# Patient Record
Sex: Male | Born: 1935
Health system: Southern US, Community
[De-identification: ages and names within clinical notes are randomized; demographics above are authoritative.]

## PROBLEM LIST (undated history)

## (undated) DIAGNOSIS — K219 Gastro-esophageal reflux disease without esophagitis: Secondary | ICD-10-CM

## (undated) DIAGNOSIS — Z9109 Other allergy status, other than to drugs and biological substances: Secondary | ICD-10-CM

## (undated) DIAGNOSIS — I10 Essential (primary) hypertension: Secondary | ICD-10-CM

## (undated) DIAGNOSIS — G4730 Sleep apnea, unspecified: Secondary | ICD-10-CM

## (undated) DIAGNOSIS — N200 Calculus of kidney: Secondary | ICD-10-CM

## (undated) DIAGNOSIS — K469 Unspecified abdominal hernia without obstruction or gangrene: Secondary | ICD-10-CM

## (undated) DIAGNOSIS — Z8719 Personal history of other diseases of the digestive system: Secondary | ICD-10-CM

## (undated) DIAGNOSIS — D751 Secondary polycythemia: Principal | ICD-10-CM

## (undated) DIAGNOSIS — M1990 Unspecified osteoarthritis, unspecified site: Secondary | ICD-10-CM

## (undated) DIAGNOSIS — Z9289 Personal history of other medical treatment: Secondary | ICD-10-CM

## (undated) DIAGNOSIS — E039 Hypothyroidism, unspecified: Secondary | ICD-10-CM

## (undated) HISTORY — DX: Sleep apnea, unspecified: G47.30

## (undated) HISTORY — DX: Calculus of kidney: N20.0

## (undated) HISTORY — DX: Unspecified abdominal hernia without obstruction or gangrene: K46.9

## (undated) HISTORY — PX: SINOSCOPY: SHX187

## (undated) HISTORY — DX: Other allergy status, other than to drugs and biological substances: Z91.09

## (undated) HISTORY — DX: Essential (primary) hypertension: I10

## (undated) HISTORY — PX: APPENDECTOMY: SHX54

## (undated) HISTORY — PX: EYE SURGERY: SHX253

## (undated) HISTORY — PX: BACK SURGERY: SHX140

## (undated) HISTORY — DX: Secondary polycythemia: D75.1

## (undated) HISTORY — PX: UPPER GASTROINTESTINAL ENDOSCOPY: SHX188

## (undated) HISTORY — PX: COLONOSCOPY: SHX174

---

## 2000-01-30 ENCOUNTER — Encounter (INDEPENDENT_AMBULATORY_CARE_PROVIDER_SITE_OTHER): Payer: Self-pay

## 2000-01-30 ENCOUNTER — Other Ambulatory Visit: Admission: RE | Admit: 2000-01-30 | Discharge: 2000-01-30 | Payer: Self-pay | Admitting: Internal Medicine

## 2002-03-30 ENCOUNTER — Ambulatory Visit: Admission: RE | Admit: 2002-03-30 | Discharge: 2002-03-30 | Payer: Self-pay | Admitting: Family Medicine

## 2003-02-26 ENCOUNTER — Ambulatory Visit (HOSPITAL_COMMUNITY): Admission: RE | Admit: 2003-02-26 | Discharge: 2003-02-26 | Payer: Self-pay | Admitting: Family Medicine

## 2003-04-06 ENCOUNTER — Encounter (HOSPITAL_COMMUNITY): Admission: RE | Admit: 2003-04-06 | Discharge: 2003-04-16 | Payer: Self-pay | Admitting: Orthopedic Surgery

## 2003-04-20 ENCOUNTER — Encounter (HOSPITAL_COMMUNITY): Admission: RE | Admit: 2003-04-20 | Discharge: 2003-05-20 | Payer: Self-pay | Admitting: Orthopaedic Surgery

## 2003-07-01 ENCOUNTER — Inpatient Hospital Stay (HOSPITAL_COMMUNITY): Admission: EM | Admit: 2003-07-01 | Discharge: 2003-07-02 | Payer: Self-pay | Admitting: Emergency Medicine

## 2003-07-02 ENCOUNTER — Ambulatory Visit (HOSPITAL_COMMUNITY): Admission: AD | Admit: 2003-07-02 | Discharge: 2003-07-02 | Payer: Self-pay | Admitting: Cardiology

## 2003-07-08 ENCOUNTER — Ambulatory Visit (HOSPITAL_COMMUNITY): Admission: RE | Admit: 2003-07-08 | Discharge: 2003-07-08 | Payer: Self-pay | Admitting: *Deleted

## 2003-12-27 ENCOUNTER — Ambulatory Visit (HOSPITAL_COMMUNITY): Admission: RE | Admit: 2003-12-27 | Discharge: 2003-12-27 | Payer: Self-pay | Admitting: Orthopedic Surgery

## 2004-02-22 ENCOUNTER — Encounter (HOSPITAL_COMMUNITY)
Admission: RE | Admit: 2004-02-22 | Discharge: 2004-03-23 | Payer: Self-pay | Admitting: Physical Medicine and Rehabilitation

## 2004-03-27 ENCOUNTER — Encounter (HOSPITAL_COMMUNITY)
Admission: RE | Admit: 2004-03-27 | Discharge: 2004-04-14 | Payer: Self-pay | Admitting: Physical Medicine and Rehabilitation

## 2005-05-11 ENCOUNTER — Ambulatory Visit: Payer: Self-pay | Admitting: Internal Medicine

## 2005-05-22 ENCOUNTER — Ambulatory Visit: Payer: Self-pay | Admitting: Cardiology

## 2005-06-15 ENCOUNTER — Encounter (INDEPENDENT_AMBULATORY_CARE_PROVIDER_SITE_OTHER): Payer: Self-pay | Admitting: Internal Medicine

## 2005-06-15 ENCOUNTER — Ambulatory Visit: Payer: Self-pay | Admitting: Internal Medicine

## 2005-06-15 ENCOUNTER — Ambulatory Visit (HOSPITAL_COMMUNITY): Admission: RE | Admit: 2005-06-15 | Discharge: 2005-06-15 | Payer: Self-pay | Admitting: Internal Medicine

## 2005-11-27 ENCOUNTER — Ambulatory Visit (HOSPITAL_COMMUNITY): Admission: RE | Admit: 2005-11-27 | Discharge: 2005-11-27 | Payer: Self-pay | Admitting: Ophthalmology

## 2005-12-11 ENCOUNTER — Ambulatory Visit (HOSPITAL_COMMUNITY): Admission: RE | Admit: 2005-12-11 | Discharge: 2005-12-11 | Payer: Self-pay | Admitting: Ophthalmology

## 2006-03-26 ENCOUNTER — Ambulatory Visit (HOSPITAL_COMMUNITY): Admission: RE | Admit: 2006-03-26 | Discharge: 2006-03-26 | Payer: Self-pay | Admitting: Ophthalmology

## 2006-05-22 ENCOUNTER — Inpatient Hospital Stay (HOSPITAL_COMMUNITY): Admission: AD | Admit: 2006-05-22 | Discharge: 2006-05-23 | Payer: Self-pay | Admitting: Family Medicine

## 2009-04-16 DIAGNOSIS — Z9289 Personal history of other medical treatment: Secondary | ICD-10-CM

## 2009-04-16 HISTORY — DX: Personal history of other medical treatment: Z92.89

## 2009-05-06 ENCOUNTER — Ambulatory Visit (HOSPITAL_COMMUNITY): Admission: RE | Admit: 2009-05-06 | Discharge: 2009-05-06 | Payer: Self-pay | Admitting: Family Medicine

## 2009-12-09 ENCOUNTER — Ambulatory Visit: Payer: Self-pay | Admitting: Cardiology

## 2009-12-09 DIAGNOSIS — I2510 Atherosclerotic heart disease of native coronary artery without angina pectoris: Secondary | ICD-10-CM

## 2009-12-09 DIAGNOSIS — R9431 Abnormal electrocardiogram [ECG] [EKG]: Secondary | ICD-10-CM | POA: Insufficient documentation

## 2009-12-09 DIAGNOSIS — I10 Essential (primary) hypertension: Secondary | ICD-10-CM | POA: Insufficient documentation

## 2009-12-12 ENCOUNTER — Encounter: Payer: Self-pay | Admitting: Cardiology

## 2009-12-23 ENCOUNTER — Ambulatory Visit: Payer: Self-pay | Admitting: Cardiology

## 2009-12-23 ENCOUNTER — Encounter (HOSPITAL_COMMUNITY): Admission: RE | Admit: 2009-12-23 | Discharge: 2009-12-23 | Payer: Self-pay | Admitting: Cardiology

## 2010-05-16 NOTE — Letter (Signed)
Summary: [facility]  [facility]   Imported By: Faythe Ghee 12/12/2009 11:43:44  _____________________________________________________________________  External Attachment:    Type:   Image     Comment:   External Document

## 2010-05-16 NOTE — Letter (Signed)
Summary: LABS 11/14/09 DR Gerda Diss  LABS 11/14/09 DR Gerda Diss   Imported By: Faythe Ghee 12/12/2009 11:44:10  _____________________________________________________________________  External Attachment:    Type:   Image     Comment:   External Document

## 2010-05-16 NOTE — Letter (Signed)
Summary: ekg   ekg   Imported By: Faythe Ghee 12/12/2009 11:42:47  _____________________________________________________________________  External Attachment:    Type:   Image     Comment:   External Document

## 2010-05-16 NOTE — Assessment & Plan Note (Signed)
Summary: ***per Dr.Scott Luking for abnormal EKG and HX of CABG/tg   Visit Type:  Initial Consult Primary Provider:  Dr. Lilyan Punt   History of Present Illness: 76 year old male presents for cardiology consultation. This is my first meeting with him. He was last seen in consultation by Dr. Dietrich Pates in 2008 in the setting of chest pain. At that time he underwent a Myoview study which was normal. Previous cardiac catheterization from 2005 is reviewed below as well.  Recent labs from one August showed sodium 141, potassium 4.3, BUN 12, creatinine 1.1, cholesterol 152, triglyceride 244, HDL 36, LDL 67, AST 24, ALT 32.  He was seen for a routine visit by Dr. Gerda Diss and had a followup ECG obtained, showing changes compared to previous tracings. There were some new lateral ST segment changes although without active symptoms at that time. He states that since he was told his ECG showed some changes, he has noticed some chest discomfort, although fairly atypical, centered on the left side of his chest and in a very focal area about the size of a quarter. No clearly exertional chest pain. He states it may be his reflux.  He has not undergone any surveillance ischemic testing since 2008. He had only mild atherosclerosis with approximately 30-40% LAD stenosis in 2005.  Preventive Screening-Counseling & Management  Alcohol-Tobacco     Smoking Status: current  Current Medications (verified): 1)  Singulair 10 Mg Tabs (Montelukast Sodium) .... Take 1 Tab Daily 2)  Advair Diskus 100-50 Mcg/dose Aepb (Fluticasone-Salmeterol) .... Use 1 Puff Daily 3)  Prilosec 20 Mg Cpdr (Omeprazole) .... Take 1 Tab Daily 4)  Pravastatin Sodium 40 Mg Tabs (Pravastatin Sodium) .... Take 1 Tab Two Times A Day 5)  Diovan Hct 80-12.5 Mg Tabs (Valsartan-Hydrochlorothiazide) .... Take 1 Tab Daily 6)  Allergy Shots .Marland Kitchen.. 1 Time Weekly  Allergies (verified): 1)  ! Penicillin  Past History:  Family History: Last updated:  12-28-2009 Father: died age 36 with stroke Mother: died age 67 with myocardial infarction Siblings: Brother died age 75 with myocardial infarction  Social History: Last updated: 2009-12-28 Tobacco Use - Yes (no smoking, chews) Alcohol Use - yes, wine Part Time - [employer]  Past Medical History: CAD - nonobstructive, LVEF normal Asthma Hyperlipidemia Nephrolithiasis Hypertension Obstructive sleep apnea History of esophageal dilatation  Past Surgical History: Appendectomy  Family History: Father: died age 16 with stroke Mother: died age 20 with myocardial infarction Siblings: Brother died age 65 with myocardial infarction  Social History: Tobacco Use - Yes (no smoking, chews) Alcohol Use - yes, wine Part Time - [employer] Smoking Status:  current  Review of Systems       The patient complains of chest pain.  The patient denies anorexia, fever, syncope, dyspnea on exertion, peripheral edema, prolonged cough, melena, and hematochezia.         Otherwise reviewed and negative except as outlined.  Vital Signs:  Patient profile:   76 year old male Height:      69 inches Weight:      200 pounds BMI:     29.64 Pulse rate:   69 / minute BP sitting:   114 / 75  (right arm)  Vitals Entered By: Dreama Saa, CNA (12-28-09 2:40 PM)  Physical Exam  Additional Exam:  Overweight male in no acute distress. HEENT: Conjunctiva and lids normal, oropharynx with moist mucosa. Neck: Supple, no elevated JVP or bruits. Lungs: Clear to auscultation, nonlabored. Cardiac: Regular rate and rhythm, no S3.  Abdomen: Soft, nontender, bowel sounds present. Skin: Warm and dry. Musculoskeletal: No kyphosis. Extremities: No pitting edema, distal pulses 2+. Neuropsychiatric: Alert and oriented x3, affect appropriate.   Nuclear Study  Procedure date:  05/23/2006  Findings:       IMPRESSION:   Stress Myoview clinically and electrically negative for ischemia.   Myoview scan with  probable normal perfusion and soft tissue   attenuation as noted above.  LVF greater than 70%.  No evidence for   significant ischemia.  Cardiac Cath  Procedure date:  07/02/2003  Findings:       HEMODYNAMIC DATA:  1. Central aortic pressure 116/72, mean 95.  2. Left ventricle 126/8.  3. No gradient on pullback across the aortic valve.   ANGIOGRAPHIC DATA:  1. Ventriculography:  Ventriculography was performed in the RAO projection.     Overall systolic function was well-preserved.  There was mild catheter-     induced mitral regurgitation, but this was not present with movement of     the catheter.  Ejection fraction appeared to exceed 60%.   1. Aortic Root Aortography:  The aortic root aortography revealed no     evidence of aortic regurgitation.  There was no evidence of aortic     dissection.   1. Left Main Coronary Artery:  The left main coronary artery was free of     critical disease.   1. LAD:  The LAD courses to the apex.  There is a major diagonal and a     septal perforator.  After the origin of the septal perforator is a mild     segmental area that appears relatively smooth, that has about 30-40%     luminal reduction.  The distal vessel is without critical narrowing.   1. Ramus Intermedius:  There is a ramus intermedius that is free of critical     disease.   1. Circumflex Artery:  The circumflex is a dominant vessel providing the     posterior descending system.  The vessel appears smooth and without     critical narrowing.   1. Right Coronary Artery:  The right coronary artery demonstrates no     significant focal stenosis.  It is a nondominant vessel.   EKG  Procedure date:  12/09/2009  Findings:      Normal sinus rhythm at 64 beats per minute.  Impression & Recommendations:  Problem # 1:  ABNORMAL ELECTROCARDIOGRAM (ICD-794.31)  ECG today is normal. I reviewed the prior tracings. Probably nonspecific changes overall.  Orders: Nuclear Stress  Test (Nuc Stress Test)  Problem # 2:  CORONARY ATHEROSCLEROSIS NATIVE CORONARY ARTERY (ICD-414.01)  Previous cardiac catheterization in 2005 showed 30-40% LAD stenosis. No subsequent ischemic testing since 2008. In light of his recent followup ECG with nonspecific changes, and symptoms although atypical, with ongoing risk factor profile, a followup exercise Myoview will be obtained for ischemic surveillance. If low risk, no further testing at this time. He can continue medical therapy and followup with Dr. Gerda Diss. Otherwise if significant changes are noted, we can see him back for further discussion.  Problem # 3:  HYPERLIPIDEMIA (ICD-272.4)  LDL at goal based on recent labs.  His updated medication list for this problem includes:    Pravastatin Sodium 40 Mg Tabs (Pravastatin sodium) .Marland Kitchen... Take 1 tab two times a day  Problem # 4:  HYPERTENSION (ICD-401.9)  Blood pressure well controlled.  His updated medication list for this problem includes:    Diovan Hct 80-12.5  Mg Tabs (Valsartan-hydrochlorothiazide) .Marland Kitchen... Take 1 tab daily  Patient Instructions: 1)  Your physician recommends that you schedule a follow-up appointment in: as needed  2)  Your physician recommends that you continue on your current medications as directed. Please refer to the Current Medication list given to you today. 3)  Your physician has requested that you have an exercise stress myoview.  For further information please visit [email or web address]  Please follow instruction sheet, as given.

## 2010-05-16 NOTE — Letter (Signed)
Summary: [place] [facility] (Nuc Med Stress)  [facility]  [address], [place] [address]   Phone: [phone]  Fax: [phone]    Nuclear Medicine 1-Day Stress Test Information Sheet  Re:     Thomas [name]   DOB:     1934-06-14 MRN:     [id number] Weight:  Appointment Date: Register at: Appointment Time: Referring MD:  _X__Exercise Stress  __Adenosine   __Dobutamine  __Lexiscan  __Persantine   __Thallium  Urgency: ____1 (next day)   ____2 (one week)    ____3 (PRN)  Patient will receive Follow Up call with results: Patient needs follow-up appointment:  Instructions regarding medication:  How to prepare for your stress test: 1. DO NOT eat or drink after midnight the night prior to test. This includes no caffeine (coffee, tea, sodas, chocolate) if you were instructed to take your medications, drink water with it. 2. DO NOT use any tobacco products for at leaset 8 hours prior to arrival. 3. DO NOT wear dresses or any clothing that may have metal clasps or buttons. 4. Wear short sleeve shirts, loose clothing, and comfortalbe walking shoes. 5. DO NOT use lotions, oils or powder on your chest before the test. 6. The test will take approximately 3-4 hours from the time you arrive until completion. 7. To register the day of the test, go to the Short Stay entrance at [facility]. 8. If you must cancel your test, call [phone] as soon as you are aware.  After you arrive for test:   When you arrive at [facility], you will go to Short Stay to be registered. They will then send you to Radiology to check in. The Nuclear Medicine Tech will get you and start an IV in your arm or hand. A small amount of a radioactive tracer will then be injected into your IV. This tracer will then have to circulate for 30-45 minutes. During this time you will wait in the waiting room and you will be able to drink something without caffeine. A series of pictures will be  taken of your heart follwoing this waiting period. After the 1st set of pictures you will go to the stress lab to get ready for your stress test. During the stress test, another small amount of a radioactive tracer will be injected through your IV. When the stress test is complete, there is a short rest period while your heart rate and blood pressure will be monitored. When this monitoring period is complete you will have another set of pictrues taken. (The same as the 1st set of pictures). These pictures are taken between 15 minutes and 1 hour after the stress test. The time depends on the type of stress test you had. Your doctor will inform you of your test results within 7 days after test.    The possibilities of certain changes are possible during the test. They include abnormal blood pressure and disorders of the heart. Side effects of persantine or adenosine can include flushing, chest pain, shortness of breath, stomach tightness, headache and light-headedness. These side effects usually do not last long and are self-resolving. Every effort will be made to keep you comfortable and to minimize complications by obtaining a medical history and by close observation during the test. Emergency equipment, medications, and trained personnel are available to deal with any unusual situation which may arise.  Please notify office at least 48 hours in advance if you are unable to keep  this appt.

## 2010-08-29 ENCOUNTER — Other Ambulatory Visit: Payer: Self-pay | Admitting: Family Medicine

## 2010-08-29 ENCOUNTER — Ambulatory Visit (HOSPITAL_COMMUNITY)
Admission: RE | Admit: 2010-08-29 | Discharge: 2010-08-29 | Disposition: A | Discharge status: Home / Self Care | Payer: Medicare Other | Source: Ambulatory Visit | Attending: Family Medicine | Admitting: Family Medicine

## 2010-08-29 DIAGNOSIS — R079 Chest pain, unspecified: Secondary | ICD-10-CM | POA: Insufficient documentation

## 2010-08-29 DIAGNOSIS — M25519 Pain in unspecified shoulder: Secondary | ICD-10-CM | POA: Insufficient documentation

## 2010-09-01 NOTE — Procedures (Signed)
NAME:  Thomas [name], Thomas [name]                        ACCOUNT NO.:  [id number]   MEDICAL RECORD NO.:  [id number]                   PATIENT TYPE:  OIB   LOCATION:  2857                                 FACILITY:  [facility]   PHYSICIAN:  Scott A. Gerda Diss, M.D.               DATE OF BIRTH:  [birth year]   DATE OF PROCEDURE:  07/02/2003  DATE OF DISCHARGE:  07/02/2003                                EKG INTERPRETATION   FINDINGS:  1. Normal sinus rhythm.  2. No acute ST-segment changes.  3. Mild bradycardia, heart rate upper 50's.  4. Otherwise normal.      ___________________________________________                                            Jonna Coup. Gerda Diss, M.D.   SAL/MEDQ  D:  07/19/2003  T:  07/19/2003  Job:  [id number]

## 2010-09-01 NOTE — Group Therapy Note (Signed)
NAME:  PHILLIPS, GOULETTE              ACCOUNT NO.:  [id number]   MEDICAL RECORD NO.:  [id number]          PATIENT TYPE:  INP   LOCATION:  IC09                          FACILITY:  [facility]   PHYSICIAN:  Scott A. Gerda Diss, MD    DATE OF BIRTH:  11/09/1934   DATE OF PROCEDURE:  05/23/2006  DATE OF DISCHARGE:                                 PROGRESS NOTE   PROGRESS NOTE - May 23, 2006.   SUBJECTIVE:  In the ICU, the patient is not having any more chest  discomfort. He denies any shortness of breath, swelling.  Denies nausea  or vomiting.   LABORATORY DATA:  His enzymes are  negative.   PHYSICAL EXAMINATION:  LUNGS:  Clear.  HEART:  Regular.  EXTREMITIES:  No edema.  SKIN:  Warm and dry.   PLAN:  Patient is scheduled today for a stress test Myoview.  We will  await the results of this and possibly home later today, depending upon  the results.      Scott A. Gerda Diss, MD  Electronically Signed     SAL/MEDQ  D:  05/23/2006  T:  05/23/2006  Job:  [id number]

## 2010-09-01 NOTE — H&P (Signed)
NAME:  Thomas [name], Thomas [name]              ACCOUNT NO.:  [id number]   MEDICAL RECORD NO.:  [id number]         PATIENT TYPE:  AMB   LOCATION:                                FACILITY:  [facility]   PHYSICIAN:  Lionel December, M.D.    DATE OF BIRTH:  April 30, 1934   DATE OF ADMISSION:  DATE OF DISCHARGE:  LH                                HISTORY & PHYSICAL   CHIEF COMPLAINT:  Intermittent hematochezia.   HISTORY OF PRESENT ILLNESS:  Mr. Thomas [name] is a 76 year old Caucasian male who  approximately two months ago developed bloody diarrhea.  He was treated with  Cipro and Flagyl and completed that course.  He also had bilateral lower  quadrant abdominal pain which he describes as cramping in nature.  He states  that most of his symptoms subsided after the completion of antibiotics.  He  has seen some small fresh hematochezia with wiping.  He denied any fever or  chills.  He did have some nausea but denied any vomiting.  He had a  colonoscopy more than five years ago in [place] and was found to have  polyps.  He is unsure as to whether these were adenomatous.   He has a history of indigestion.  He takes Prilosec daily with 100% relief  of his symptoms.  He denies any dysphagia, odynophagia, anorexia, or early  satiety.   PAST MEDICAL HISTORY:  1.  Hypercholesterolemia.  2.  Asthma.  3.  Hypertension.  4.  Renal lithiasis.  5.  Seasonal allergies.  6.  Esophageal dysphagia.  7.  GERD.  8.  History of a stricture.  9.  Back surgery.  10. Appendectomy.   CURRENT MEDICATIONS:  1.  Singulair 10 mg daily.  2.  Diovan 80 mg daily.  3.  Prilosec 20 mg daily.  4.  Advair 100/50 mg b.i.d.  5.  Allergy injections weekly.  6.  Advil p.r.n.   ALLERGIES:  PENICILLIN.   FAMILY HISTORY:  No known family history of colorectal carcinoma or chronic  GI problems.  Mother deceased at age 65 secondary to MI.  Father deceased at  age 74 secondary to CVA.   SOCIAL HISTORY:  Mr. Thomas [name] is a widower.  He lives  alone.  He has one grown  healthy son.  He is employed part-time with [employer].  He denies any  tobacco use.  He consumes a couple of alcoholic beverages a month.  He  denies any drug use.   REVIEW OF SYSTEMS:  CONSTITUTIONAL:  Weight is stable.  Denies any fever or  chills.  Denies any fatigue.  CARDIOVASCULAR:  Denies chest pain or  palpitations.  PULMONARY:  Denies shortness of breath, dyspnea, cough, or  hemoptysis.  GI:  See HPI.   PHYSICAL EXAMINATION:  VITAL SIGNS:  Weight 198 pounds, height 69 inches.  Temperature 98.2, blood pressure 130/80, pulse 68.  GENERAL:  Mr. Thomas [name] is a 76 year old Caucasian male who is alert, oriented,  pleasant, and cooperative, and in no acute distress.  HEENT:  Clear sclerae.  _____________ .  Oropharynx pink and moist without  any lesions.  NECK:  Supple without mass or thyromegaly.  CHEST:  Regular rate and rhythm.  Normal S1 and S2, without murmurs, clicks,  rubs, or gallops.  LUNGS:  Clear to auscultation bilaterally.  ABDOMEN:  Positive bowel sounds x4.  No bruits auscultated.  Soft,  nontender, nondistended.  Without ____________ or hepatosplenomegaly.  No  rebound, tenderness, or guarding.  RECTAL:  Deferred.  EXTREMITIES:  Without clubbing or edema bilaterally.  SKIN:  Warm and dry without rash or jaundice.   IMPRESSION:  Thomas [name] is a 76 year old Caucasian male who approximately  three months ago had bloody diarrhea.  Was treated with Cipro and Flagyl  which responded well.  He has a personal history of colonic polyps, unsure  as to whether these are adenomatous.  He has had intermittent hematochezia  and is going to need further evaluation to rule out colorectal carcinoma.  It is possible that he may have had diverticular bleeding as well as  ischemic colitis.   PLAN:  1.  Will schedule colonoscopy with Dr. Karilyn Cota in the near future.  I have      discussed this procedure, including risks and benefits ___________       bleeding, infection, perforation, drug reaction.  He agrees to this      plan.  Consent will be obtained.  2.  Continue Prilosec 20 mg daily.  3.  Further recommendations pending procedure.   We would like to thank Dr. Lilyan Punt for allowing [place] to participate in  the care of Thomas [name].      Thomas [name], N.P.      Lionel December, M.D.  Electronically Signed    KC/MEDQ  D:  05/14/2005  T:  05/14/2005  Job:  [id number]   cc:   Lorin Picket A. Gerda Diss, MD  Fax: [phone]

## 2010-09-01 NOTE — Consult Note (Signed)
NAME:  Thomas [name], Thomas [name]                        ACCOUNT NO.:  [id number]   MEDICAL RECORD NO.:  [id number]                  PATIENT TYPE:   LOCATION:                                       FACILITY:  [facility]   PHYSICIAN:  Pricilla Riffle, M.D.                 DATE OF BIRTH:  Jul 28, 1934   DATE OF CONSULTATION:  07/01/2003  DATE OF DISCHARGE:                                   CONSULTATION   IDENTIFICATION:  The patient is a 76 year old male with no known CAD who  presents today for evaluation of chest pain.   HISTORY OF PRESENT ILLNESS:  The patient has no known coronary disease, as  stated.  He had a Cardiolite scan done in the past that was reportedly  negative.   Over the past month or so the patient has noted increased episodes of chest  tightness; occur with and without activity; has been progressive; worsening  in severity and frequency.  The son and the patient note also increased  fatigue, increased shortness of breath/dyspnea on exertion.  The patient  denies PND.   Today the patient presented to Dr. Fletcher Anon office with chest tightness.  Sent from the office to the emergency room.  The patient notes no radiation,  does note shortness of breath.   ALLERGIES:  Penicillin.   MEDICATIONS:  Prior to admission Relafen, Advair, Singulair, Avapro, allergy  shots.   PAST MEDICAL HISTORY:  1. Hypertension.  2. Hiatal hernia.  3. Asthma.  4. Mild sleep apnea.  5. Dyslipidemia with increased triglycerides in 2003.   SOCIAL HISTORY:  The patient lives in [place]; does not smoke; he is  married.   FAMILY HISTORY:  Father died of CVA at age 42.  One brother died of an MI at  age 2.   REVIEW OF SYSTEMS:  The patient denies any fevers or chills.  No recent  wheezing or URI exacerbation.  Has a history of reflux.  Has been on  Prilosec.   PHYSICAL EXAMINATION:  GENERAL:  The patient is in mild distress with 3/10  chest pressure.  VITAL SIGNS:  Blood pressure 151/83, pulse of  74 and regular, temperature  97.6.  HEENT:  Normocephalic, atraumatic.  NECK:  No JVD, no bruits.  LUNGS:  Clear to auscultation.  No wheezes or rales.  CARDIAC:  Regular rate and rhythm.  S1, S2 no S3 or S4, no significant  murmurs.  ABDOMEN:  Abdominal exam is benign.  No bruits.  EXTREMITIES:  2+ distal pulses, no edema.   EKG:  A 12-lead EKG shows normal sinus rhythm at a rate of 64 beats/minute.  Slight T wave flattening in V1, V2.  Q wave in III.   LABS:  Significant for a hemoglobin of 16.9, WBC of 5.6, platelets 229,000.  BUN and creatinine of 7 and 1, potassium 3.8.  Initial CKMB of 154 and 3,  troponin 0.01.  IMPRESSION:  The patient is a 76 year old with a history of chest pain which  is concerning for progressive angina.  I discussed this with the patient and  the son who would recommend a left heart catheterization to define anatomy.  Would begin heparin, Integrilin, nitro, morphine, aspirin, low-dose beta  blockade.  Plan for in a.m.      ___________________________________________                                            Pricilla Riffle, M.D.   PVR/MEDQ  D:  07/01/2003  T:  07/02/2003  Job:  [id number]

## 2010-09-01 NOTE — Consult Note (Signed)
NAME:  Thomas [name], Thomas [name]              ACCOUNT NO.:  [id number]   MEDICAL RECORD NO.:  [id number]          PATIENT TYPE:  INP   LOCATION:  IC09                          FACILITY:  [facility]   PHYSICIAN:  Thomas Friends. Dietrich Pates, MD, FACCDATE OF BIRTH:  1934/11/06   DATE OF CONSULTATION:  05/22/2006  DATE OF DISCHARGE:                                 CONSULTATION   HISTORY OF PRESENT ILLNESS:  Dr. Fletcher [name] request for consultation  concerning Mr. Thomas [name] is greatly appreciated.  This very nice 76-year-  old gentleman was previously a patient of Dr. Dorethea [name], last seen in  March2005.  At that time, the patient was experiencing chest  discomfort and underwent coronary angiography at [facility].  That study revealed insignificant coronary disease and normal left  ventricular systolic function.  The most notable lesion was a 40% mid  LAD lesion.  A stress nuclear study was subsequently performed  demonstrating good exercise tolerance and neither electrocardiographic  nor scintigraphic evidence for ischemia.   Mr. Thomas [name] has done well in the intervening 4 months, generally free of  chest discomfort.  It seems that he has occasional mild symptoms that he  does not report.  He has noted increased symptoms over the past few  weeks.  He intermittently describes aching left upper chest discomfort  of mild to moderate severity with radiation to the mid lower substernal  region.  There is associated mild dyspnea but no diaphoresis or nausea.  There is some exacerbation with exertion.  The patient has not tried  nitroglycerin nor antacids.  He does have a history of hiatal hernia  with heartburn that has been controlled with a PPI.  He was evaluated in  his physician's office this morning due to continuing and somewhat more  severe pain associated with elevation in his blood pressure and referred  to the hospital for admission.   PAST MEDICAL HISTORY:  Otherwise notable for hypertension and  dyslipidemia, that have been well-controlled and asthma.  The patient  does not smoke cigarettes, but does chew some tobacco.   PAST SURGICAL HISTORY:  Include a presumed laminectomy of the  lumbosacral spine of approximately 25 years ago and a remote  appendectomy.   MEDICATIONS:  Recent medications include Diovan 80 mg daily, Singulair  10 mg daily, Advair 10/50 mg 1 puff daily, aspirin 325 mg daily,  Prilosec 20 mg daily and Pravachol 40 mg daily.   ALLERGIES:  The patient has an allergy to penicillin.   FAMILY HISTORY SOCIAL HISTORY AND REVIEW OF SYSTEMS:  Were updated.  Asthma has been under generally good control.  There is also history of  mild sleep apnea, which is not terribly problematic.  The patient has  some arthritic discomfort, primarily in his shoulder, and uses a  Etodolac typically once a day.  He requires corrective lenses for near  vision.  He has impaired hearing with a hearing aid that does not  provide much benefit.  He has partial dentures.  All other systems  reviewed and are negative.   PHYSICAL EXAMINATION:  GENERAL:  A pleasant, well-appearing gentleman in  no acute distress.  VITAL SIGNS:  The heart rate is 75 and regular, blood pressure 150/70,  respirations 16, afebrile.  HEENT:  Excessive laxity of skin around the eye; umbilicated papule over  the left cheek; EOM's full; pupils equal, round, react to light; normal  oral mucosa.  NECK:  No jugular venous distension; normal carotid upstrokes without  bruits.  ENDOCRINE:  No thyromegaly.  HEMATOPOIETIC:  No adenopathy.  SKIN:  As noted above.  PSYCHIATRIC:  Alert and oriented; normal affect.  LUNGS:  Clear with normal expiratory phase and no wheezing.  CARDIAC:  Normal first and second heart sounds; fourth heart sound  present.  ABDOMEN:  Soft and nontender; normal bowel sounds; no masses; no  organomegaly.  EXTREMITIES:  Good distal pulses; no edema.  NEUROMUSCULAR:  Symmetric strength and tone;  normal cranial nerves.   EKG:  Normal sinus rhythm; flattening of the T-waves in leads 1 and L;  otherwise unremarkable.   Other laboratory pending.   IMPRESSION:  Mr. Thomas [name] presents with atypical chest discomfort with  some worrisome features, most notably associated dyspnea in relationship  to exertion.  His cardiac catheterization was good almost 3 years ago;  while progression to a level at which he would be symptomatic is  somewhat unexpected, it is certainly not impossible.  Nonetheless, I  doubt that he is experiencing acute coronary syndrome in light of the  nature of his symptoms, and his unremarkable EKG during discomfort.  Serial cardiac markers will be obtained.  If negative, as expected, we  will proceed with a stress nuclear study in the morning.   The patient is concerned about his blood pressure.  It is unlikely that  his low dose of Diovan is adequate therapy.  We will add a low dose of  diuretic.   The etiology of his chest discomfort is not clear.  A GI origin is  certainly possible.  If symptoms persist, my first approach would be to  increase his PPI dosage.   We will be happy to follow this nice gentleman with you both in hospital  and subsequent to discharge.      Thomas Friends. Dietrich Pates, MD, [facility]  Electronically Signed     RMR/MEDQ  D:  05/22/2006  T:  05/23/2006  Job:  [id number]

## 2010-09-01 NOTE — H&P (Signed)
NAME:  Thomas [name], Thomas [name]                        ACCOUNT NO.:  [id number]   MEDICAL RECORD NO.:  [id number]                   PATIENT TYPE:  INP   LOCATION:  IC06                                 FACILITY:  [facility]   PHYSICIAN:  Scott A. Gerda Diss, M.D.               DATE OF BIRTH:  [birth year]   DATE OF ADMISSION:  07/01/2003  DATE OF DISCHARGE:                                HISTORY & PHYSICAL   CHIEF COMPLAINT:  Chest discomfort.   HISTORY OF PRESENT ILLNESS:  This is a 76 year old white male who relates  substernal chest pressure and tightness, feels like cannot get a good deep  breath.  These spells hit anywhere from a few minutes to 20-30 minutes.  He  has described discomfort today that started earlier today with minimal  activity.  He states over the past couple of weeks that it has been worse  when he does exertional activities of light nature.  He has also had a  couple of times where it has hit him at nighttime when he is lying in bed.  He denies regurgitation or reflux.  He states today's discomfort went into  the chest, felt shortness of breath, and radiated into the left arm.   PAST MEDICAL HISTORY:  1. Asthma.  2. Hyperlipidemia.  3. HTN.  4. Sleep apnea, very mild, no CPAP indicated, diagnosed January 2004.   Does not smoke, does chew tobacco, is widowed.   ALLERGIES:  PENICILLIN.   MEDICATIONS:  1. Avapro 150 mg daily.  2. Singulair 10 mg daily.  3. Advair 100/50 one inhalation b.i.d.  4. Allergy shots weekly.  5. Relafen 500 mg b.i.d.  6. Prilosec 20 mg daily.   LABORATORY DATA:  CBC normal.  MET-7 normal.  CPK, MB, troponin I normal.  Liver enzymes normal.   PHYSICAL EXAMINATION:  VITAL SIGNS:  Blood pressure 151/83, heart rate 74,  respiratory rate 18.  HEENT:  Benign.  NECK:  No masses.  CHEST:  CTA.  HEART:  Regular.  ABDOMEN:  Soft.  EXTREMITIES:  No edema.   EKG:  ST segment flattening in the inferior and lateral leads.   ASSESSMENT AND PLAN:  Chest  pressure and tightness.  Possible new-onset  unstable angina.  Heparin, IV nitroglycerin, beta blockers, aspirin, monitor  closely, morphine for pain.  Recheck if further troubles or worse.  Otherwise, cardiac enzymes q.8h.  Consult cardiology.  Feel the patient  needs catheterization.  Will follow the patient closely and try to get him  down to [facility] in a timely fashion for a catheterization.     ___________________________________________                                         Jonna Coup Gerda Diss, M.D.   Linus Orn  D:  07/01/2003  T:  07/01/2003  Job:  [id number]

## 2010-09-01 NOTE — Discharge Summary (Signed)
NAME:  Thomas [name], Thomas [name]              ACCOUNT NO.:  [id number]   MEDICAL RECORD NO.:  [id number]          PATIENT TYPE:  INP   LOCATION:  IC09                          FACILITY:  [facility]   PHYSICIAN:  Scott A. Gerda Diss, MD    DATE OF BIRTH:  [birth year]   DATE OF ADMISSION:  05/22/2006  DATE OF DISCHARGE:  02/07/2008LH                               DISCHARGE SUMMARY   DISCHARGE DIAGNOSIS:  Chest pain.   HOSPITAL COURSE:  This patient was admitted in after having some chest  pain discomfort.  He relates left anterior, aching in nature.  No  diaphoresis or nausea with it.  He feels a little short of breath at  times.  He has a family history of heart disease.  He also has an  underlying history of asthma, reflux, hypertension and lipids.  Does not  smoke.  Was admitted in.  Was treated.  Had a Cardiolite study done and  a consultation by cardiology.  The stress test came back normal with  normal-looking images, and it was felt that the patient was stable for  discharge and that this was not cardiac.   DISCHARGE MEDICATIONS:  1. He was sent home with his Diovan being increased to 160/12.5.  In addition to this, his other medicines were to stay the same  including:  1. Advair 1 inhalation b.i.d.  2. Lodine t.i.d. p.r.n.  3. Pravastatin 40 mg daily.  4. Singulair 10 mg daily.  5. Prilosec 20 mg at bedtime.   FOLLOWUP:  He is to follow up with [place] in approximately 1 week, sooner if  any problems.      Scott A. Gerda Diss, MD  Electronically Signed     SAL/MEDQ  D:  06/28/2006  T:  06/29/2006  Job:  [id number]

## 2010-09-01 NOTE — H&P (Signed)
NAME:  Thomas [name], Thomas [name]              ACCOUNT NO.:  [id number]   MEDICAL RECORD NO.:  [id number]          PATIENT TYPE:  INP   LOCATION:  IC09                          FACILITY:  [facility]   PHYSICIAN:  Donna Bernard, M.D.DATE OF BIRTH:  1935-02-28   DATE OF ADMISSION:  05/22/2006  DATE OF DISCHARGE:  LH                              HISTORY & PHYSICAL   CHIEF COMPLAINT:  Chest pain.   SUBJECTIVE:  This patient is a 76 year old white male with a very strong  family history of coronary artery disease who arrives in the office with  complaints of chest pain.  He describes the chest discomfort as left  anterior and it is aching in nature.  No associated diaphoresis or  nausea.  He does feel bit short of breath at times.  He also had some  shoulder pain.  He saw Dr. Lilyan Punt last week and was put on anti-  inflammatories medicine for this.  He states he thinks his shoulder is  better but the chest is in no better.  He is concerned about his blood  pressure.  He has been on a number of medications.  He claims compliance  with his medications currently which include Singulair 10 mg daily,  Advair 100/50 one puff daily, Prilosec 20 mg daily, Diovan 80 mg daily,  Pravachol 40 mg p.o. q.h.s.   PRIOR MEDICAL HISTORY:  1. Significant for hypertension.  2. Mild sleep apnea.  3. Asthma.  4. Hyperlipidemia.  5. Kidney stones.   PAST SURGERIES:  1. Remote appendectomy.  2. Remote kidney stone.  3. Colonoscopy which showed diverticulosis in 2007.  4. A cardiac catheterization in 2005 which showed 30% blockage in one      vessel with a negative Cardiolite at that time.   FAMILY HISTORY:  Positive for hypertension.  Strong family history of  heart disease.  Brother who died in the early 20s from a heart attack.  Personal history of high cholesterol.   SOCIAL:  The patient is widowed, retired.  No tobacco use.  No alcohol  use.   REVIEW OF SYSTEMS:  Negative.   BP 158/100.  Alert, mild  distress and anxiety.  Blood pressure of 152/96  on repeat.  HEENT: Normal.  NECK:  Supple.  LUNGS:  Clear.  HEART:  Regular rate and rhythm.  No obvious chest wall tender.  SHOULDER:  No pain with rotation.  ABDOMINAL EXAM:  Benign.  EXTREMITIES:  Normal.  NEURO EXAM:  Intact.   EKG normal sinus rhythm.  No significant ST-T changes.  First set of  cardiac enzymes negative.  Chest x-ray pending.   IMPRESSION:  1. Chest pain atypical in nature, however, with tremendous number of      risk factors.  The patient's lipid profile showed an HDL as low as      15 a couple years ago.  2. Hypertension, suboptimal.  3. Hyperlipidemia.  4. Mild sleep apnea.   PLAN:  1. Admit the patient to the hospital.  2. Serial cardiac enzymes.  3. Cardiology consult.  4. Increase Diovan to 80 mg  b.i.d.  5. Nasal cannula.  6. Trial of nitroglycerin if pain recurs.  Further orders as noted on      chart.      Donna Bernard, M.D.  Electronically Signed     WSL/MEDQ  D:  05/22/2006  T:  05/22/2006  Job:  [id number]

## 2010-09-01 NOTE — Discharge Summary (Signed)
NAME:  Thomas [name], Thomas [name]                        ACCOUNT NO.:  [id number]   MEDICAL RECORD NO.:  [id number]                   PATIENT TYPE:  INP   LOCATION:  IC06                                 FACILITY:  [facility]   PHYSICIAN:  Scott A. Gerda Diss, M.D.               DATE OF BIRTH:  1934/08/16   DATE OF ADMISSION:  07/01/2003  DATE OF DISCHARGE:  07/02/2003                                 DISCHARGE SUMMARY   DISCHARGE DIAGNOSES:  1. Chest pain.  2. Probable unstable angina.   HOSPITAL COURSE:  This 76 year old white male was admitted in with  substernal pressure and discomfort.  He was felt to be suspicious for a  coronary artery disease given that it was exacerbated with activity.  It was  felt that it would be best for the patient to be on heparin, Integrilin,  nitroglycerin, morphine, aspirin, low-dose beta blocker and gear toward  catheterization.  He was able to be taken by ambulance to [facility] on  the 18th.  Discharged from our facility in good condition.     ___________________________________________                                         Jonna Coup Gerda Diss, M.D.   Linus Orn  D:  07/19/2003  T:  07/19/2003  Job:  [id number]

## 2010-09-01 NOTE — Cardiovascular Report (Signed)
NAME:  Thomas [name], Thomas [name]                        ACCOUNT NO.:  [id number]   MEDICAL RECORD NO.:  [id number]                   PATIENT TYPE:  OIB   LOCATION:  2857                                 FACILITY:  [facility]   PHYSICIAN:  Arturo Morton. Riley Kill, M.D. [facility]         DATE OF BIRTH:  14-Jan-1935   DATE OF PROCEDURE:  07/02/2003  DATE OF DISCHARGE:  07/02/2003                              CARDIAC CATHETERIZATION   PROCEDURES PERFORMED:  1. Left heart catheterization.  2. Selective coronary arteriography.  3. Selective left ventriculography.  4. Aortic root aortography.   CARDIOLOGIST:  Arturo Morton. Riley Kill, M.D.   INDICATIONS:  Mr. Thomas [name] is a 75 year old gentleman who presented with some  chest discomfort that seems to be worse with exertion at some point.  There  are, however, some atypical features.  The current study was done to assess  coronary anatomy.   DESCRIPTION OF THE PROCEDURE:  The patient was brought to the cath lab, and  prepped and draped in the usual fashion.  Through an anterior puncture the  right femoral artery was easily entered.  A 6 French sheath was placed.  Views of the left and right coronary arteries were obtained in multiple  angiographic projections.  Ventriculography was performed in the RAO  projection.  Following the pressure pullback aortography was performed in  the LAO projection.   At the completion of the procedure blood was sent for a D-dimer.   The sheath was removed in the catheterization laboratory and hemostasis  achieved by direct manual compression.   HEMODYNAMIC DATA:  1. Central aortic pressure 116/72, mean 95.  2. Left ventricle 126/8.  3. No gradient on pullback across the aortic valve.   ANGIOGRAPHIC DATA:  1. Ventriculography:  Ventriculography was performed in the RAO projection.     Overall systolic function was well-preserved.  There was mild catheter-     induced mitral regurgitation, but this was not present with movement of  the catheter.  Ejection fraction appeared to exceed 60%.   1. Aortic Root Aortography:  The aortic root aortography revealed no     evidence of aortic regurgitation.  There was no evidence of aortic     dissection.   1. Left Main Coronary Artery:  The left main coronary artery was free of     critical disease.   1. LAD:  The LAD courses to the apex.  There is a major diagonal and a     septal perforator.  After the origin of the septal perforator is a mild     segmental area that appears relatively smooth, that has about 30-40%     luminal reduction.  The distal vessel is without critical narrowing.   1. Ramus Intermedius:  There is a ramus intermedius that is free of critical     disease.   1. Circumflex Artery:  The circumflex is a dominant vessel providing the     posterior descending  system.  The vessel appears smooth and without     critical narrowing.   1. Right Coronary Artery:  The right coronary artery demonstrates no     significant focal stenosis.  It is a nondominant vessel.   CONCLUSION:  1. Well-preserved left ventricular function.  2. No evidence of aortic dissection.  3. D-dimer reported as less than 0.22.  4. Thirty to forty percent segmental area of narrowing in the left anterior     descending artery that does not appear to be high-grade.   DISPOSITION:  We will treat the patient medically.  Consideration should be  given to exercise stress testing with radionuclide imaging to exclude a  hemodynamically significant lesion in the mid LAD.  The PPI will be  increased to twice daily.                                               Arturo Morton. Riley Kill, M.D. [facility]    TDS/MEDQ  D:  07/02/2003  T:  07/03/2003  Job:  [id number]   cc:   Lorin Picket A. Gerda Diss, M.D.  [address]  [place]  [place] [address]  Fax: [phone]   Pricilla Riffle, M.D.   [facility]

## 2010-09-01 NOTE — Op Note (Signed)
NAME:  Thomas [name], Thomas [name]              ACCOUNT NO.:  [id number]   MEDICAL RECORD NO.:  [id number]          PATIENT TYPE:  AMB   LOCATION:  DAY                           FACILITY:  [facility]   PHYSICIAN:  Lionel December, M.D.    DATE OF BIRTH:  June 25, 1934   DATE OF PROCEDURE:  06/15/2005  DATE OF DISCHARGE:                                 OPERATIVE REPORT   PROCEDURE:  Colonoscopy with polypectomy.   ENDOSCOPIST:  Lionel December, M.D.   INDICATIONS:  Corey is a 76 year old Caucasian male who recently  experienced an episode of bloody diarrhea and responded to therapy.  He has  been having intermittent hematochezia.  He also has a history of colonic  polyps and last exam was in [place] over 5 years ago. He is undergoing  diagnostic as well as surveillance colonoscopy.  The procedure and risks  were reviewed with the patient and informed consent was obtained.   MEDICINES FOR CONSCIOUS SEDATION:  Demerol 50 mg IV and Versed 5 mg.   FINDINGS:  Procedure performed in endoscopy suite.  The patient's vital  signs and O2 saturations were monitored during procedure and remained  stable.  The patient was placed in the left lateral recumbent position and  rectal examination was performed.  No abnormality noted on external or  digital exam.   Olympus videoscope was placed in the rectum and advanced under vision into  the sigmoid colon and beyond.  Preparation was excellent.  Scope was passed  to the cecum which was identified by appendiceal orifice.  There was a 5-to-  6-mm polyp involving the blind end of the cecum.  This was snared and  retrieved for histologic examination.  As the scope was withdrawn, the  colonic mucosa was, once again, carefully examined.  It was normal  throughout.  There were a few tiny diverticula in the sigmoid colon.  Rectal  mucosa was normal.   The scope was retroflexed while in the rectum to examine the anorectal  junction which was unremarkable.  Endoscope was  straightened and withdrawn.  The patient tolerated the procedure well.   FINAL DIAGNOSES:  1.  A 5-to-6-mm polyp snared from the cecum.  2.  A few tiny diverticula at the sigmoid colon.   RECOMMENDATIONS:  1.  High fiber diet.  2.  Standard instructions give.  3.  I will be contacting the patient with the biopsy results and further      recommendations.      Lionel December, M.D.  Electronically Signed     NR/MEDQ  D:  06/15/2005  T:  06/15/2005  Job:  [id number]   cc:   Lorin Picket A. Gerda Diss, MD  Fax: [phone]

## 2010-09-01 NOTE — Discharge Summary (Signed)
NAME:  KIMON, LOEWEN                        ACCOUNT NO.:  [id number]   MEDICAL RECORD NO.:  [id number]                   PATIENT TYPE:  OIB   LOCATION:  2857                                 FACILITY:  [facility]   PHYSICIAN:  Arturo Morton. Riley Kill, M.D. [facility]         DATE OF BIRTH:  1935-02-22   DATE OF ADMISSION:  07/02/2003  DATE OF DISCHARGE:  07/02/2003                                 DISCHARGE SUMMARY   PROCEDURES:  1. Cardiac catheterization.  2. Coronary arteriogram.  3. Left ventriculogram.   HOSPITAL COURSE:  Mr. Rio is a 76 year old male with no known history of  coronary artery disease.  He went to [facility] for chest pain on  July 01, 2003.  He was admitted by Dr. Gerda Diss, and a cardiac consult was  called for multiple cardiac risk factors.   Dr. Dietrich Pates evaluated Mr. Conaway and felt his symptoms were concerning  for unstable anginal pain.  He has a family history of coronary artery  disease, hypertension, and mixed dyslipidemia.  She felt that with symptoms  concerning for unstable anginal pain and cardiac risk factors, a cardiac  catheterization was indicated, and he was transferred to [facility]  for this on July 02, 2003.   The cardiac catheterization showed no critical coronary artery disease.  There was a 30-40% stenosis in the mid-LAD that did not appear to have any  kind of a plaque rupture.  His EF was normal at greater than 60%.  His  abdominal aortogram showed no AI or dissection.  Dr. Riley Kill evaluated the  films and felt that he had noncritical coronary artery disease; therefore,  medical therapy was recommended.  A D-dimer was checked as well, and it was  less than 0.22.  Dr. Riley Kill evaluated Mr. Segundo and felt that he was  stable for discharge on July 02, 2003 without patient followup arranged.   DISCHARGE CONDITION:  Stable.   DISCHARGE DIAGNOSES:  1. Chest pain, no dissection or aortic insufficiency by aortogram, normal  left ventricular function, and noncritical coronary artery disease by     catheterization with a normal D-dimer.  2. Hypertension.  3. History of hiatal hernia.  4. History of asthma.  5. History of mild sleep apnea.  6. History of dyslipidemia.  7. Family history of coronary artery disease with a brother who died at age     88 of an myocardial infarction.  8. History of allergy to PENICILLIN.  9. Gastroesophageal reflux disease symptoms.   DISCHARGE INSTRUCTIONS:  1. His activity level is to include no strenuous activity for 48 hours.  2. He is not to drive for 24 hours.  3. He is to stick to a low-fat diet.  4. He is to call our office for problems with the cath site.  5. He is to followup with Dr. Dorethea Clan in [place] on July 05, 2003 at 1     p.m.  6. He is to followup with Dr. Gerda Diss as needed or as scheduled.   DISCHARGE MEDICATIONS:  1. Advair 100/50 b.i.d.  2. Avapro 150 mg daily.  3. Singulair 10 mg daily.  4. Protonix 40 mg b.i.d.  5. Aspirin 81 mg daily.      Theodore Demark, P.A. [facility]                  Thomas D. Riley Kill, M.D. [facility]    RB/MEDQ  D:  07/02/2003  T:  07/05/2003  Job:  [id number]   cc:   Lorin Picket A. Gerda Diss, M.D.  [address], [address]  [place]  [place] [address]  Fax: [phone]   Vida Roller, M.D.  Fax: [phone]

## 2010-09-12 ENCOUNTER — Other Ambulatory Visit: Payer: Self-pay | Admitting: Family Medicine

## 2010-09-12 DIAGNOSIS — M25511 Pain in right shoulder: Secondary | ICD-10-CM

## 2010-09-12 DIAGNOSIS — M5412 Radiculopathy, cervical region: Secondary | ICD-10-CM

## 2010-09-14 ENCOUNTER — Ambulatory Visit (HOSPITAL_COMMUNITY)
Admission: RE | Admit: 2010-09-14 | Discharge: 2010-09-14 | Disposition: A | Discharge status: Home / Self Care | Payer: Medicare Other | Source: Ambulatory Visit | Attending: Family Medicine | Admitting: Family Medicine

## 2010-09-14 DIAGNOSIS — M5412 Radiculopathy, cervical region: Secondary | ICD-10-CM

## 2010-09-14 DIAGNOSIS — M5030 Other cervical disc degeneration, unspecified cervical region: Secondary | ICD-10-CM | POA: Insufficient documentation

## 2010-09-14 DIAGNOSIS — M25511 Pain in right shoulder: Secondary | ICD-10-CM

## 2010-09-14 DIAGNOSIS — M4802 Spinal stenosis, cervical region: Secondary | ICD-10-CM | POA: Insufficient documentation

## 2010-09-14 DIAGNOSIS — M25519 Pain in unspecified shoulder: Secondary | ICD-10-CM | POA: Insufficient documentation

## 2010-09-14 DIAGNOSIS — M542 Cervicalgia: Secondary | ICD-10-CM | POA: Insufficient documentation

## 2011-03-05 ENCOUNTER — Ambulatory Visit (INDEPENDENT_AMBULATORY_CARE_PROVIDER_SITE_OTHER): Payer: Medicare Other | Admitting: Internal Medicine

## 2011-03-05 ENCOUNTER — Encounter (INDEPENDENT_AMBULATORY_CARE_PROVIDER_SITE_OTHER): Payer: Self-pay | Admitting: Internal Medicine

## 2011-03-05 VITALS — BP 108/80 | HR 76 | Temp 97.8°F | Resp 14 | Ht 70.0 in | Wt 204.0 lb

## 2011-03-05 DIAGNOSIS — R071 Chest pain on breathing: Secondary | ICD-10-CM

## 2011-03-05 DIAGNOSIS — R0789 Other chest pain: Secondary | ICD-10-CM

## 2011-03-05 NOTE — Progress Notes (Signed)
Presenting complaint; chest pain across the lower sternum. History of present illness. Patient is 76 year old Caucasian male patient of Dr. Fanny Dance who is here for scheduled visit complaining of intermittent lower midsternal pain which she describes as soreness or area of being sensitive. He's been having this discomfort for few weeks. It may last for several hours or a whole day and then he feels better. This pain is not associated with heartburn, nausea, vomiting, dysphagia, abdominal pain, diaphoreses or shortness of breath. There is no relationship of this pain with his meals. He believes physical work seem to bring this pain on; also believes that treatment he is getting by his chiropractor may have made it worse. He states he's never missed Prilosec which was begun on 5 years ago for GERD. 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 This pain does not radiate. He has a good appetite and there has been no change in his weight and last few years. Also denies constipation melena or rectal bleeding. Current medications; Current Outpatient Prescriptions  Medication Sig Dispense Refill  . Fluticasone-Salmeterol (ADVAIR) 100-50 MCG/DOSE AEPB Inhale 1 puff into the lungs every 12 (twelve) hours.        . gabapentin (NEURONTIN) 400 MG capsule Take 400 mg by mouth 3 (three) times daily.        . montelukast (SINGULAIR) 10 MG tablet Take 10 mg by mouth at bedtime.        Marland Kitchen omeprazole (PRILOSEC) 20 MG capsule Take 20 mg by mouth daily.        . pravastatin (PRAVACHOL) 40 MG tablet Take 40 mg by mouth daily.        Marland Kitchen triamcinolone cream (KENALOG) 0.1 % Apply 1 application topically 2 (two) times daily.       . valsartan-hydrochlorothiazide (DIOVAN-HCT) 80-12.5 MG per tablet Take 1 tablet by mouth daily.         Past medical history; Bronchial asthma. Hypertension of 12 years duration. Hyperlipidemia Nonobstructive CAD with 30-40% LAD stenosis on cardiac  catheter of 2005. Exercise Myoview in September 2011 with ST segment changes felt to be due to hypertensive response and not indicated of ischemia. Chronic low back pain. Chronic shoulder pain. Chronic GERD complicated by esophageal stricture dilated over 5 years ago. Appendectomy 50 years ago. Lumbar spine surgery for disc disease 40 years ago. Hearing impairment. Last colonoscopy was in March 2007 with removal of a small polyp which was leiomyoma. Allergies Penicillin. Family history. Father was hypertensive and died of MI at age 47. Mother died of MI at age 4. One brother died of MI at age 54. One sister died last year at age 78. Social history; He is widowed. He retired from [employer] in generally 2000 and now working part-time with the same company. He does not smoke cigarettes but has been chewing tobacco for the last 62 years and he drinks alcohol occasionally. He has one son in good health. Physical Exam: BP 108/80  Pulse 76  Temp(Src) 97.8 F (36.6 C) (Oral)  Resp 14  Ht 5\' 10"  (1.778 m)  Wt 204 lb (92.534 kg)  BMI 29.27 kg/m2 General:   Alert,  Well-developed, well-nourished, pleasant and cooperative in NAD Head:  Normocephalic and atraumatic. Eyes:  Sclera clear, no icterus.   Conjunctiva pink. Mouth:  Oropharyngeal mucosa is normal.  Neck:  Supple; no masses or thyromegaly. Chest: Symmetrical without tenderness over sternum or rib cage. Pain is reproduced  when he abducts his upper extremities and stretches them upwards. Lungs:  Clear throughout to auscultation.   No wheezes, crackles, or rhonchi. Heart:  Regular rate and rhythm; no murmurs. Abdomen:  Soft, nontender and nondistended. No masses, hepatosplenomegaly or hernias noted.  Rectal:  Deferred. Stool guaiac negative earlier this year.   Extremities:  Without clubbing or edema. Skin: Multiple small skin tags noted over chest and abdomen but very few involving the neck.  Assessment. #1. Chest pain.  The symptom appears to be musculoskeletal or chest wall pain. This pain does not appear to be due to GERD and there is nothing to suggest biliary tract disease. He also does not appear to be anginal pain. #2. Chronic GERD. Symptoms well controlled with anti-reflux measures and Prilosec OTC. #3. His risk for colorectal carcinoma is average and he is up-to-date on his screening. Next one would be due in March 2017. Recommendations; Patient advised to keep symptom diary. He will continue anti-reflux measures and Prilosec OTC as before. He can try Advil OTC or 400 mg 3 times a day as needed. He would return for office visit in 2 months

## 2011-03-05 NOTE — Patient Instructions (Addendum)
Keep a symptom diary as recommended until office visit in 2 months. Continue anti-reflux measures and Prilosec OTC as before

## 2011-04-19 DIAGNOSIS — J3089 Other allergic rhinitis: Secondary | ICD-10-CM | POA: Diagnosis not present

## 2011-04-26 DIAGNOSIS — J3089 Other allergic rhinitis: Secondary | ICD-10-CM | POA: Diagnosis not present

## 2011-05-03 DIAGNOSIS — J3089 Other allergic rhinitis: Secondary | ICD-10-CM | POA: Diagnosis not present

## 2011-05-07 ENCOUNTER — Ambulatory Visit (INDEPENDENT_AMBULATORY_CARE_PROVIDER_SITE_OTHER): Payer: Medicare Other | Admitting: Internal Medicine

## 2011-05-07 ENCOUNTER — Encounter (INDEPENDENT_AMBULATORY_CARE_PROVIDER_SITE_OTHER): Payer: Self-pay | Admitting: Internal Medicine

## 2011-05-07 VITALS — BP 120/74 | HR 76 | Temp 98.4°F | Resp 16 | Ht 68.0 in | Wt 202.0 lb

## 2011-05-07 DIAGNOSIS — R0789 Other chest pain: Secondary | ICD-10-CM | POA: Insufficient documentation

## 2011-05-07 DIAGNOSIS — K219 Gastro-esophageal reflux disease without esophagitis: Secondary | ICD-10-CM | POA: Insufficient documentation

## 2011-05-07 MED ORDER — OMEPRAZOLE 20 MG PO CPDR: 20.0000 mg | DELAYED_RELEASE_CAPSULE | Freq: Every day | ORAL | Status: DC

## 2011-05-07 NOTE — Patient Instructions (Signed)
Prescription for omeprazole 20 mg by mouth daily send to the pharmacy for 90 days with 3 refills. Notify if chest pain recurs.

## 2011-05-07 NOTE — Progress Notes (Signed)
Presenting complaint; Follow for chest pain and GERD. Subjective: Patient is 77 year old Caucasian male who is seen 2 months ago for chest pain felt to be musculoskeletal. He has chronic GERD and has been maintained on omeprazole. He did try to take it every other day but one is here to wake up and regurgitate food. He can spaghetti in the evening. He has gone back to omeprazole once a day. He believes is manipulation by his chiropractor helped alleviate his chest pain. He remains with good appetite. He does complain of intermittent bloating without abdominal pain diarrhea and/or constipation. He works 3 days a week. 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 Current Medications: Current Outpatient Prescriptions  Medication Sig Dispense Refill  . Fluticasone-Salmeterol (ADVAIR) 100-50 MCG/DOSE AEPB Inhale 1 puff into the lungs every 12 (twelve) hours.        . gabapentin (NEURONTIN) 400 MG capsule Take 400 mg by mouth 4 (four) times daily.       . montelukast (SINGULAIR) 10 MG tablet Take 10 mg by mouth at bedtime.        Marland Kitchen omeprazole (PRILOSEC) 20 MG capsule Take 20 mg by mouth daily.       . pravastatin (PRAVACHOL) 40 MG tablet Take 40 mg by mouth daily.        Marland Kitchen triamcinolone cream (KENALOG) 0.1 % Apply 1 application topically 2 (two) times daily.       . valsartan-hydrochlorothiazide (DIOVAN-HCT) 80-12.5 MG per tablet Take 1 tablet by mouth daily.          Objective: Blood pressure 120/74, pulse 76, temperature 98.4 F (36.9 C), temperature source Oral, resp. rate 16, height 5\' 8"  (1.727 m), weight 202 lb (91.627 kg).  Conjunctiva is pink. Sclera is nonicteric Oral pharyngeal mucosa is normal. No neck masses or thyromegaly noted. He has slight tenderness on palpation over lower sternum. Cardiac exam with regular rhythm normal S1 and S2. No murmur or gallop noted. Lungs are clear to auscultation. Abdomen is symmetrical. Abdomen is soft. Liver edge is easily  palpable below RCM with sharp margin and is non-tender. No LE edema or clubbing noted.  Assessment: #1. Chest pain. He is not having any more chest pain but his sternum and slightly tender to palpation. No further workup indicated since he is much better. #2. Chronic GERD. He should continue with anti-reflex measures and omeprazole 20 mg daily or take at least 5 times a week.   Plan: New prescription for omeprazole 20 mg 90 doses with 3 refills sent in his pharmacy. Office visit on as needed basis.

## 2011-05-10 DIAGNOSIS — J3089 Other allergic rhinitis: Secondary | ICD-10-CM | POA: Diagnosis not present

## 2011-05-17 DIAGNOSIS — J3089 Other allergic rhinitis: Secondary | ICD-10-CM | POA: Diagnosis not present

## 2011-05-18 DIAGNOSIS — I10 Essential (primary) hypertension: Secondary | ICD-10-CM | POA: Diagnosis not present

## 2011-05-18 DIAGNOSIS — E785 Hyperlipidemia, unspecified: Secondary | ICD-10-CM | POA: Diagnosis not present

## 2011-05-24 DIAGNOSIS — J3089 Other allergic rhinitis: Secondary | ICD-10-CM | POA: Diagnosis not present

## 2011-05-31 DIAGNOSIS — J3089 Other allergic rhinitis: Secondary | ICD-10-CM | POA: Diagnosis not present

## 2011-06-07 DIAGNOSIS — J3089 Other allergic rhinitis: Secondary | ICD-10-CM | POA: Diagnosis not present

## 2011-06-14 DIAGNOSIS — J3089 Other allergic rhinitis: Secondary | ICD-10-CM | POA: Diagnosis not present

## 2011-06-28 DIAGNOSIS — J3089 Other allergic rhinitis: Secondary | ICD-10-CM | POA: Diagnosis not present

## 2011-07-09 DIAGNOSIS — J019 Acute sinusitis, unspecified: Secondary | ICD-10-CM | POA: Diagnosis not present

## 2011-07-09 DIAGNOSIS — J988 Other specified respiratory disorders: Secondary | ICD-10-CM | POA: Diagnosis not present

## 2011-07-12 DIAGNOSIS — J3089 Other allergic rhinitis: Secondary | ICD-10-CM | POA: Diagnosis not present

## 2011-07-19 DIAGNOSIS — J3089 Other allergic rhinitis: Secondary | ICD-10-CM | POA: Diagnosis not present

## 2011-07-26 DIAGNOSIS — J3089 Other allergic rhinitis: Secondary | ICD-10-CM | POA: Diagnosis not present

## 2011-08-02 DIAGNOSIS — J3089 Other allergic rhinitis: Secondary | ICD-10-CM | POA: Diagnosis not present

## 2011-08-09 DIAGNOSIS — J3089 Other allergic rhinitis: Secondary | ICD-10-CM | POA: Diagnosis not present

## 2011-08-16 DIAGNOSIS — J3089 Other allergic rhinitis: Secondary | ICD-10-CM | POA: Diagnosis not present

## 2011-08-30 DIAGNOSIS — J3089 Other allergic rhinitis: Secondary | ICD-10-CM | POA: Diagnosis not present

## 2011-09-04 DIAGNOSIS — J309 Allergic rhinitis, unspecified: Secondary | ICD-10-CM | POA: Diagnosis not present

## 2011-09-11 DIAGNOSIS — E782 Mixed hyperlipidemia: Secondary | ICD-10-CM | POA: Diagnosis not present

## 2011-09-11 DIAGNOSIS — Z79899 Other long term (current) drug therapy: Secondary | ICD-10-CM | POA: Diagnosis not present

## 2011-09-13 DIAGNOSIS — J3089 Other allergic rhinitis: Secondary | ICD-10-CM | POA: Diagnosis not present

## 2011-09-20 DIAGNOSIS — J3089 Other allergic rhinitis: Secondary | ICD-10-CM | POA: Diagnosis not present

## 2011-09-27 DIAGNOSIS — J3089 Other allergic rhinitis: Secondary | ICD-10-CM | POA: Diagnosis not present

## 2011-10-16 DIAGNOSIS — Z0000 Encounter for general adult medical examination without abnormal findings: Secondary | ICD-10-CM | POA: Diagnosis not present

## 2011-10-16 DIAGNOSIS — I10 Essential (primary) hypertension: Secondary | ICD-10-CM | POA: Diagnosis not present

## 2011-10-24 DIAGNOSIS — J3089 Other allergic rhinitis: Secondary | ICD-10-CM | POA: Diagnosis not present

## 2011-10-30 DIAGNOSIS — Z961 Presence of intraocular lens: Secondary | ICD-10-CM | POA: Diagnosis not present

## 2011-11-01 DIAGNOSIS — J3089 Other allergic rhinitis: Secondary | ICD-10-CM | POA: Diagnosis not present

## 2011-11-08 DIAGNOSIS — J3089 Other allergic rhinitis: Secondary | ICD-10-CM | POA: Diagnosis not present

## 2011-11-22 DIAGNOSIS — J3089 Other allergic rhinitis: Secondary | ICD-10-CM | POA: Diagnosis not present

## 2011-12-05 DIAGNOSIS — J3089 Other allergic rhinitis: Secondary | ICD-10-CM | POA: Diagnosis not present

## 2011-12-05 DIAGNOSIS — K219 Gastro-esophageal reflux disease without esophagitis: Secondary | ICD-10-CM | POA: Diagnosis not present

## 2011-12-05 DIAGNOSIS — J45909 Unspecified asthma, uncomplicated: Secondary | ICD-10-CM | POA: Diagnosis not present

## 2011-12-06 DIAGNOSIS — J3089 Other allergic rhinitis: Secondary | ICD-10-CM | POA: Diagnosis not present

## 2011-12-20 DIAGNOSIS — J3089 Other allergic rhinitis: Secondary | ICD-10-CM | POA: Diagnosis not present

## 2011-12-22 DIAGNOSIS — Z125 Encounter for screening for malignant neoplasm of prostate: Secondary | ICD-10-CM | POA: Diagnosis not present

## 2011-12-22 DIAGNOSIS — Z79899 Other long term (current) drug therapy: Secondary | ICD-10-CM | POA: Diagnosis not present

## 2012-01-03 DIAGNOSIS — J3089 Other allergic rhinitis: Secondary | ICD-10-CM | POA: Diagnosis not present

## 2012-01-09 DIAGNOSIS — Z0000 Encounter for general adult medical examination without abnormal findings: Secondary | ICD-10-CM | POA: Diagnosis not present

## 2012-01-17 DIAGNOSIS — J3089 Other allergic rhinitis: Secondary | ICD-10-CM | POA: Diagnosis not present

## 2012-01-24 DIAGNOSIS — J3089 Other allergic rhinitis: Secondary | ICD-10-CM | POA: Diagnosis not present

## 2012-01-31 DIAGNOSIS — J3089 Other allergic rhinitis: Secondary | ICD-10-CM | POA: Diagnosis not present

## 2012-02-07 DIAGNOSIS — J3089 Other allergic rhinitis: Secondary | ICD-10-CM | POA: Diagnosis not present

## 2012-02-12 DIAGNOSIS — J3089 Other allergic rhinitis: Secondary | ICD-10-CM | POA: Diagnosis not present

## 2012-02-15 ENCOUNTER — Other Ambulatory Visit: Payer: Self-pay | Admitting: Family Medicine

## 2012-02-15 ENCOUNTER — Ambulatory Visit (HOSPITAL_COMMUNITY)
Admission: RE | Admit: 2012-02-15 | Discharge: 2012-02-15 | Disposition: A | Discharge status: Home / Self Care | Payer: Medicare Other | Source: Ambulatory Visit | Attending: Family Medicine | Admitting: Family Medicine

## 2012-02-15 DIAGNOSIS — S5000XA Contusion of unspecified elbow, initial encounter: Secondary | ICD-10-CM | POA: Diagnosis not present

## 2012-02-15 DIAGNOSIS — M25521 Pain in right elbow: Secondary | ICD-10-CM

## 2012-02-15 DIAGNOSIS — M25529 Pain in unspecified elbow: Secondary | ICD-10-CM | POA: Diagnosis not present

## 2012-02-20 DIAGNOSIS — IMO0002 Reserved for concepts with insufficient information to code with codable children: Secondary | ICD-10-CM | POA: Diagnosis not present

## 2012-02-28 DIAGNOSIS — J3089 Other allergic rhinitis: Secondary | ICD-10-CM | POA: Diagnosis not present

## 2012-03-06 DIAGNOSIS — J3089 Other allergic rhinitis: Secondary | ICD-10-CM | POA: Diagnosis not present

## 2012-03-10 DIAGNOSIS — IMO0002 Reserved for concepts with insufficient information to code with codable children: Secondary | ICD-10-CM | POA: Diagnosis not present

## 2012-03-11 DIAGNOSIS — J3089 Other allergic rhinitis: Secondary | ICD-10-CM | POA: Diagnosis not present

## 2012-03-19 DIAGNOSIS — N410 Acute prostatitis: Secondary | ICD-10-CM | POA: Diagnosis not present

## 2012-03-20 DIAGNOSIS — J3089 Other allergic rhinitis: Secondary | ICD-10-CM | POA: Diagnosis not present

## 2012-03-29 ENCOUNTER — Other Ambulatory Visit (INDEPENDENT_AMBULATORY_CARE_PROVIDER_SITE_OTHER): Payer: Self-pay | Admitting: Internal Medicine

## 2012-04-03 DIAGNOSIS — J3089 Other allergic rhinitis: Secondary | ICD-10-CM | POA: Diagnosis not present

## 2012-04-07 ENCOUNTER — Ambulatory Visit (HOSPITAL_COMMUNITY)
Admission: RE | Admit: 2012-04-07 | Discharge: 2012-04-07 | Disposition: A | Discharge status: Home / Self Care | Payer: Medicare Other | Source: Ambulatory Visit | Attending: Family Medicine | Admitting: Family Medicine

## 2012-04-07 ENCOUNTER — Other Ambulatory Visit (HOSPITAL_COMMUNITY): Payer: No Typology Code available for payment source

## 2012-04-07 ENCOUNTER — Other Ambulatory Visit: Payer: Self-pay | Admitting: Family Medicine

## 2012-04-07 DIAGNOSIS — R1030 Lower abdominal pain, unspecified: Secondary | ICD-10-CM

## 2012-04-07 DIAGNOSIS — K449 Diaphragmatic hernia without obstruction or gangrene: Secondary | ICD-10-CM | POA: Insufficient documentation

## 2012-04-07 DIAGNOSIS — R911 Solitary pulmonary nodule: Secondary | ICD-10-CM | POA: Insufficient documentation

## 2012-04-07 DIAGNOSIS — K5730 Diverticulosis of large intestine without perforation or abscess without bleeding: Secondary | ICD-10-CM | POA: Insufficient documentation

## 2012-04-07 DIAGNOSIS — R109 Unspecified abdominal pain: Secondary | ICD-10-CM | POA: Insufficient documentation

## 2012-04-07 DIAGNOSIS — R1084 Generalized abdominal pain: Secondary | ICD-10-CM | POA: Diagnosis not present

## 2012-04-07 DIAGNOSIS — K7689 Other specified diseases of liver: Secondary | ICD-10-CM | POA: Diagnosis not present

## 2012-04-07 MED ORDER — IOHEXOL 300 MG/ML  SOLN
100.0000 mL | Freq: Once | INTRAMUSCULAR | Status: AC | PRN
  Administered 2012-04-07: 80 mL via INTRAVENOUS

## 2012-04-17 DIAGNOSIS — J3089 Other allergic rhinitis: Secondary | ICD-10-CM | POA: Diagnosis not present

## 2012-04-24 ENCOUNTER — Other Ambulatory Visit: Payer: Self-pay | Admitting: Family Medicine

## 2012-04-24 DIAGNOSIS — R911 Solitary pulmonary nodule: Secondary | ICD-10-CM

## 2012-04-24 DIAGNOSIS — D1430 Benign neoplasm of unspecified bronchus and lung: Secondary | ICD-10-CM | POA: Diagnosis not present

## 2012-04-29 ENCOUNTER — Ambulatory Visit (HOSPITAL_COMMUNITY)
Admission: RE | Admit: 2012-04-29 | Discharge: 2012-04-29 | Disposition: A | Discharge status: Home / Self Care | Payer: Medicare Other | Source: Ambulatory Visit | Attending: Family Medicine | Admitting: Family Medicine

## 2012-04-29 DIAGNOSIS — I712 Thoracic aortic aneurysm, without rupture, unspecified: Secondary | ICD-10-CM | POA: Insufficient documentation

## 2012-04-29 DIAGNOSIS — J3089 Other allergic rhinitis: Secondary | ICD-10-CM | POA: Diagnosis not present

## 2012-04-29 DIAGNOSIS — R911 Solitary pulmonary nodule: Secondary | ICD-10-CM | POA: Insufficient documentation

## 2012-05-01 DIAGNOSIS — I719 Aortic aneurysm of unspecified site, without rupture: Secondary | ICD-10-CM | POA: Diagnosis not present

## 2012-05-16 DIAGNOSIS — J309 Allergic rhinitis, unspecified: Secondary | ICD-10-CM | POA: Diagnosis not present

## 2012-05-22 DIAGNOSIS — J3089 Other allergic rhinitis: Secondary | ICD-10-CM | POA: Diagnosis not present

## 2012-06-05 DIAGNOSIS — J3089 Other allergic rhinitis: Secondary | ICD-10-CM | POA: Diagnosis not present

## 2012-06-12 DIAGNOSIS — J3089 Other allergic rhinitis: Secondary | ICD-10-CM | POA: Diagnosis not present

## 2012-06-13 DIAGNOSIS — J449 Chronic obstructive pulmonary disease, unspecified: Secondary | ICD-10-CM | POA: Diagnosis not present

## 2012-06-26 DIAGNOSIS — J3089 Other allergic rhinitis: Secondary | ICD-10-CM | POA: Diagnosis not present

## 2012-06-27 DIAGNOSIS — I10 Essential (primary) hypertension: Secondary | ICD-10-CM | POA: Diagnosis not present

## 2012-06-27 DIAGNOSIS — D45 Polycythemia vera: Secondary | ICD-10-CM | POA: Diagnosis not present

## 2012-06-27 DIAGNOSIS — R918 Other nonspecific abnormal finding of lung field: Secondary | ICD-10-CM | POA: Diagnosis not present

## 2012-06-27 DIAGNOSIS — I712 Thoracic aortic aneurysm, without rupture: Secondary | ICD-10-CM | POA: Diagnosis not present

## 2012-06-27 DIAGNOSIS — E041 Nontoxic single thyroid nodule: Secondary | ICD-10-CM | POA: Diagnosis not present

## 2012-06-27 DIAGNOSIS — Z79899 Other long term (current) drug therapy: Secondary | ICD-10-CM | POA: Diagnosis not present

## 2012-06-27 DIAGNOSIS — I2510 Atherosclerotic heart disease of native coronary artery without angina pectoris: Secondary | ICD-10-CM | POA: Diagnosis not present

## 2012-06-27 DIAGNOSIS — K7689 Other specified diseases of liver: Secondary | ICD-10-CM | POA: Diagnosis not present

## 2012-06-27 DIAGNOSIS — E785 Hyperlipidemia, unspecified: Secondary | ICD-10-CM | POA: Diagnosis not present

## 2012-07-01 DIAGNOSIS — J3089 Other allergic rhinitis: Secondary | ICD-10-CM | POA: Diagnosis not present

## 2012-07-07 ENCOUNTER — Other Ambulatory Visit: Payer: Self-pay | Admitting: *Deleted

## 2012-07-07 ENCOUNTER — Encounter: Payer: Self-pay | Admitting: *Deleted

## 2012-07-07 MED ORDER — GABAPENTIN 400 MG PO CAPS: 400.0000 mg | ORAL_CAPSULE | Freq: Four times a day (QID) | ORAL | Status: DC

## 2012-07-08 DIAGNOSIS — J3089 Other allergic rhinitis: Secondary | ICD-10-CM | POA: Diagnosis not present

## 2012-07-11 ENCOUNTER — Encounter: Payer: Self-pay | Admitting: Family Medicine

## 2012-07-11 ENCOUNTER — Ambulatory Visit (INDEPENDENT_AMBULATORY_CARE_PROVIDER_SITE_OTHER): Payer: Medicare Other | Admitting: Family Medicine

## 2012-07-11 VITALS — BP 118/68 | HR 80 | Wt 199.6 lb

## 2012-07-11 DIAGNOSIS — N419 Inflammatory disease of prostate, unspecified: Secondary | ICD-10-CM | POA: Diagnosis not present

## 2012-07-11 DIAGNOSIS — J019 Acute sinusitis, unspecified: Secondary | ICD-10-CM

## 2012-07-11 MED ORDER — LEVOFLOXACIN 500 MG PO TABS: 500.0000 mg | ORAL_TABLET | Freq: Every day | ORAL | Status: AC

## 2012-07-11 NOTE — Progress Notes (Signed)
  Subjective:    Patient ID: Thomas [name], male    DOB: 12/07/1934, 78 y.o.   MRN: [id number]  Sinusitis This is a new problem. The current episode started in the past 7 days. The problem has been gradually worsening since onset. There has been no fever. The fever has been present for less than 1 day. His pain is at a severity of 0/10. The pain is mild. Associated symptoms include congestion and coughing. Pertinent negatives include no chills, diaphoresis, ear pain, headaches, hoarse voice or neck pain. Past treatments include nothing. The treatment provided no relief.      Review of Systems  Constitutional: Negative for chills and diaphoresis.  HENT: Positive for congestion. Negative for ear pain, hoarse voice and neck pain.   Respiratory: Positive for cough.   Genitourinary: Positive for frequency, difficulty urinating and genital sores.  Neurological: Negative for headaches.       Objective:   Physical Exam  Constitutional: He appears well-developed and well-nourished.  HENT:  Head: Normocephalic.  Right Ear: External ear normal.  Left Ear: External ear normal.  Neck: Normal range of motion. No thyromegaly present.  Cardiovascular: Normal rate and normal heart sounds.   Pulmonary/Chest: Effort normal and breath sounds normal.  Abdominal: Soft. He exhibits no distension. There is no tenderness.  Genitourinary:  State tender mildly swollen no hard nodules  Musculoskeletal: He exhibits no edema.  Lymphadenopathy:    He has no cervical adenopathy.          Assessment & Plan:  Acute sinusitis - Plan: levofloxacin (LEVAQUIN) 500 MG tablet  Prostatitis - Plan: levofloxacin (LEVAQUIN) 500 MG tablet

## 2012-07-11 NOTE — Patient Instructions (Signed)
Call us i n may for appointment in [place] / ask that nurse mail you papers for thyroid/ cholesterol/ kidney function test check   Prostatitis The prostate gland is about the size and shape of a walnut. It is located just below your bladder. It produces one of the components of semen, which is made up of sperm and the fluids that help nourish and transport it out from the testicles. Prostatitis is redness, soreness, and swelling (inflammation) of the prostate gland.  There are 3 types of prostatitis:  Acute bacterial prostatitis This is the least common type of prostatitis. It starts quickly and usually leads to a bladder infection. It can occur at any age.  Chronic bacterial prostatitis This is a persistent bacterial infection in the prostate.It usually develops from repeated acute bacterial prostatitis or acute bacterial prostatitis that was not properly treated. It can occur in men of any age but is most common in middle-aged men whose prostate has begun to enlarge.  Chronic prostatitis chronic pelvic pain syndrome This is the most common type of prostatitis. It is inflammation of the prostate gland that is not caused by a bacterial infection. The cause is unknown. CAUSES The cause of acute and chronic bacterial prostatitis is a bacterial infection. The exact cause of chronic prostatitis and chronic pelvic pain syndrome and asymptomatic inflammatory prostatitis is unknown.  SYMPTOMS  Symptoms can vary depending upon the type of prostatitis that exists. There can also be overlap in symptoms. Possible symptoms for each type of prostatitis are listed below. Acute bacterial prostatitis  Painful urination.  Fever or chills.  Muscle or joint pains.  Low back pain.  Low abdominal pain.  Inability to empty bladder completely.  Sudden urge to urinate.  Frequent urination.  Difficulty starting urine stream.  Weak urine stream.  Discharge from the urethra.  Dribbling after  urination.  Rectal pain.  Pain in the testicles, penis, or tip of the penis.  Pain in the space between the anus and scrotum (perineum).  Problems with sexual function.  Painful ejaculation.  Bloody semen. Chronic bacterial prostatitis  The symptoms are similar to those of acute bacterial prostatitis, but they usually are much less severe. Fever, chills, and muscle and joint pain are not associated with chronic bacterial prostatitis. Chronic prostatitis chronic pelvic pain syndrome  Symptoms typically include a dull ache in the scrotum and the perineum. DIAGNOSIS  In order to diagnose prostatitis, your caregiver will ask about your symptoms. If acute or chronic bacterial prostatitis is suspected, a urine sample will be taken and tested (urinalysis). This is to see if there is bacteria in your urine. If the urinalysis result is negative for bacteria, your caregiver may use a finger to feel your prostate (digital rectal exam). This exam helps your caregiver determine if your prostate is swollen and tender. TREATMENT  Treatment for prostatitis depends on the cause. If a bacterial infection is the cause, it can be treated with antibiotic medicine. In cases of chronic bacterial prostatitis, the use of antibiotics for up to 1 month may be necessary. Your caregiver may instruct you to take sitz baths to help relieve pain. A sitz bath is a bath of hot water in which your hips and buttocks are under water. HOME CARE INSTRUCTIONS   Take all medicines as directed by your caregiver.  Take sitz baths as directed by your caregiver. SEEK MEDICAL CARE IF:   Your symptoms get worse, not better.  You have a fever. SEEK IMMEDIATE MEDICAL CARE  IF:   You have chills.  You feel nauseous or vomit.  You feel lightheaded or faint.  You are unable to urinate.  You have blood or blood clots in your urine. Document Released: 03/30/2000 Document Revised: 06/25/2011 Document Reviewed:  03/05/2011 [facility] Patient Information 2013 [place], [place].

## 2012-07-22 DIAGNOSIS — J3089 Other allergic rhinitis: Secondary | ICD-10-CM | POA: Diagnosis not present

## 2012-07-28 ENCOUNTER — Other Ambulatory Visit: Payer: Self-pay | Admitting: Family Medicine

## 2012-07-29 DIAGNOSIS — J3089 Other allergic rhinitis: Secondary | ICD-10-CM | POA: Diagnosis not present

## 2012-08-01 ENCOUNTER — Other Ambulatory Visit: Payer: Self-pay | Admitting: *Deleted

## 2012-08-01 DIAGNOSIS — E041 Nontoxic single thyroid nodule: Secondary | ICD-10-CM

## 2012-08-01 NOTE — Progress Notes (Signed)
Thyroid U/S scheduled at [facility]  08/07/12 @10 :00 am. Patient notified. F/U office visit with Dr Lorin Picket scheduled a week later.

## 2012-08-05 DIAGNOSIS — J3089 Other allergic rhinitis: Secondary | ICD-10-CM | POA: Diagnosis not present

## 2012-08-07 ENCOUNTER — Ambulatory Visit (HOSPITAL_COMMUNITY): Payer: Medicare Other

## 2012-08-08 ENCOUNTER — Ambulatory Visit (HOSPITAL_COMMUNITY)
Admission: RE | Admit: 2012-08-08 | Discharge: 2012-08-08 | Disposition: A | Discharge status: Home / Self Care | Payer: Medicare Other | Source: Ambulatory Visit | Attending: Family Medicine | Admitting: Family Medicine

## 2012-08-08 DIAGNOSIS — E042 Nontoxic multinodular goiter: Secondary | ICD-10-CM | POA: Diagnosis not present

## 2012-08-08 DIAGNOSIS — E041 Nontoxic single thyroid nodule: Secondary | ICD-10-CM | POA: Diagnosis not present

## 2012-08-12 DIAGNOSIS — J3089 Other allergic rhinitis: Secondary | ICD-10-CM | POA: Diagnosis not present

## 2012-08-15 ENCOUNTER — Ambulatory Visit (INDEPENDENT_AMBULATORY_CARE_PROVIDER_SITE_OTHER): Payer: Medicare Other | Admitting: Family Medicine

## 2012-08-15 ENCOUNTER — Telehealth: Payer: Self-pay | Admitting: Family Medicine

## 2012-08-15 ENCOUNTER — Encounter: Payer: Self-pay | Admitting: Family Medicine

## 2012-08-15 VITALS — BP 110/80 | HR 70 | Ht 67.5 in | Wt 201.0 lb

## 2012-08-15 DIAGNOSIS — E785 Hyperlipidemia, unspecified: Secondary | ICD-10-CM | POA: Diagnosis not present

## 2012-08-15 DIAGNOSIS — D751 Secondary polycythemia: Secondary | ICD-10-CM | POA: Diagnosis not present

## 2012-08-15 DIAGNOSIS — N289 Disorder of kidney and ureter, unspecified: Secondary | ICD-10-CM | POA: Diagnosis not present

## 2012-08-15 DIAGNOSIS — E041 Nontoxic single thyroid nodule: Secondary | ICD-10-CM

## 2012-08-15 NOTE — Telephone Encounter (Signed)
Referral for Interventional Radiology in my workqueue for a thyroid biopsy, we normally refer to ENT for them to do biopsy.  I'm not sure how to order or if I would or the nurses.  Please advise.

## 2012-08-15 NOTE — Progress Notes (Signed)
  Subjective:    Patient ID: Thomas [name], male    DOB: 1934/07/29, 78 y.o.   MRN: [id number]  HPI This patient presents after having ultrasound of thyroid it shows a nodule in the right thyroid that measures approximately 1" x 0.75" x 3 for central. There is no associated pain discomfort or difficulty swallowing. PMH benign this was incidentally found on a CT scan of the chest and now we did ultrasound to better define it.   Review of Systems See above.    Objective:   Physical Exam Neck no masses but some enlargement of the thyroid on that side is noted. There is no associated lymph nodes. Lungs clear heart regular.       Assessment & Plan:  Thyroid nodule-fine needle aspiration indicated. I talked at length with the patient. After the fine-needle aspiration we will help set up with ear nose throat. Patient will probably need to have that area resected and possibly the whole thyroid removed depending on what the fine needle aspiration shows. He understands all this questions were asked and answered we will proceed forward with referral.

## 2012-08-19 DIAGNOSIS — J3089 Other allergic rhinitis: Secondary | ICD-10-CM | POA: Diagnosis not present

## 2012-08-20 ENCOUNTER — Other Ambulatory Visit: Payer: Self-pay | Admitting: Family Medicine

## 2012-08-20 DIAGNOSIS — E041 Nontoxic single thyroid nodule: Secondary | ICD-10-CM

## 2012-08-20 NOTE — Telephone Encounter (Signed)
Appt 08/26/12 @ 9:45 for thyroid biopsy, you must go into the electronic order to sign, I have LMOM to notify patient and am mailing letter to notify pt

## 2012-08-20 NOTE — Telephone Encounter (Signed)
Sharia Reeve, I spoke with Thomas [name] at [facility] radiology, [phone], she states on Tuesdays and Thursdays they do thyroid biopsies using ultrasound. She states that if you call her they can schedule him for this test. I would prefer for the biopsy to be done first before were for him to ENT. Thank you

## 2012-08-26 ENCOUNTER — Other Ambulatory Visit: Payer: Self-pay | Admitting: Family Medicine

## 2012-08-26 ENCOUNTER — Other Ambulatory Visit: Payer: Medicare Other

## 2012-08-26 ENCOUNTER — Ambulatory Visit (HOSPITAL_COMMUNITY)
Admission: RE | Admit: 2012-08-26 | Discharge: 2012-08-26 | Disposition: A | Discharge status: Home / Self Care | Payer: Medicare Other | Source: Ambulatory Visit | Attending: Family Medicine | Admitting: Family Medicine

## 2012-08-26 ENCOUNTER — Other Ambulatory Visit (HOSPITAL_COMMUNITY): Payer: Self-pay | Admitting: Family Medicine

## 2012-08-26 DIAGNOSIS — E041 Nontoxic single thyroid nodule: Secondary | ICD-10-CM | POA: Diagnosis not present

## 2012-08-26 DIAGNOSIS — E063 Autoimmune thyroiditis: Secondary | ICD-10-CM | POA: Diagnosis not present

## 2012-08-26 DIAGNOSIS — E0789 Other specified disorders of thyroid: Secondary | ICD-10-CM | POA: Diagnosis not present

## 2012-08-26 MED ORDER — LIDOCAINE HCL (PF) 2 % IJ SOLN
INTRAMUSCULAR | Status: AC
  Filled 2012-08-26: qty 10

## 2012-08-26 NOTE — Progress Notes (Signed)
Lidocaine 2%            3mL injected                 Right thyroid biopsy performed

## 2012-08-26 NOTE — Procedures (Signed)
PreOperative Dx: RIGHT thyroid mass Postoperative Dx: RIGHT thyroid mass Procedure:   US guided FNA RIGHT thyroid mass Radiologist:  Tyron Russell Anesthesia:  3 ml of 2% lidocaine Specimen:  FNA x 3  EBL:   None Complications: None

## 2012-08-29 ENCOUNTER — Encounter: Payer: Self-pay | Admitting: Family Medicine

## 2012-08-29 ENCOUNTER — Telehealth: Payer: Self-pay | Admitting: Family Medicine

## 2012-08-29 ENCOUNTER — Ambulatory Visit (INDEPENDENT_AMBULATORY_CARE_PROVIDER_SITE_OTHER): Payer: Medicare Other | Admitting: Family Medicine

## 2012-08-29 VITALS — BP 126/82 | Temp 99.1°F | Wt 200.2 lb

## 2012-08-29 DIAGNOSIS — J45909 Unspecified asthma, uncomplicated: Secondary | ICD-10-CM

## 2012-08-29 DIAGNOSIS — J209 Acute bronchitis, unspecified: Secondary | ICD-10-CM

## 2012-08-29 DIAGNOSIS — E041 Nontoxic single thyroid nodule: Secondary | ICD-10-CM | POA: Diagnosis not present

## 2012-08-29 MED ORDER — LEVOFLOXACIN 500 MG PO TABS: 500.0000 mg | ORAL_TABLET | Freq: Every day | ORAL | Status: AC

## 2012-08-29 MED ORDER — PREDNISONE 20 MG PO TABS: ORAL_TABLET | ORAL | Status: DC

## 2012-08-29 NOTE — Progress Notes (Signed)
  Subjective:    Patient ID: Thomas [name], male    DOB: 06-11-1934, 78 y.o.   MRN: [id number]  Wheezing  This is a new problem. The current episode started in the past 7 days. Associated symptoms include coughing and a fever. Treatments tried: otc allergy med. The treatment provided no relief.  Patient also relates having increased coughing congestion bringing up discolored phlegm denies high fever chills or sweats energy level subpar. PMH chronic lung issues. Also has a thyroid for which he had nodule biopsy done recently.    Review of Systems  Constitutional: Positive for fever.  Respiratory: Positive for cough and wheezing.    The results of the thyroid nodule were reviewed in detail.    Objective:   Physical Exam Eardrums normal throat normal lungs scattered wheezes cough yellow mucus abdomen soft       Assessment & Plan:  Thyroid nodule-referral to ENT abnormal cells recommend removal of the least the nodule Bronchitis with reactive airway prednisone taper continue inhalers Levaquin daily for 10 days if high fevers or worse followup

## 2012-08-29 NOTE — Telephone Encounter (Signed)
Visit scheduled for 08/29/12 with Dr. Lorin Picket 5:10pm

## 2012-08-29 NOTE — Telephone Encounter (Signed)
#  1-I. Did receive the results of his biopsy and we had listed that as a message for me to speak with him. But if he needs something for congestion because he thinks he said the best thing to do is schedule an office visit for later today and we can see him for that as well as discuss the biopsy.

## 2012-08-29 NOTE — Telephone Encounter (Signed)
Wants to know if we have received the results of his Bioscopy.    Would like to know if we can call something in to [facility] for Upper Respiratory Congestion?  Please call patient as soon as possible.  Thanks

## 2012-08-29 NOTE — Telephone Encounter (Signed)
Pt made appt today with Dr. Lorin Picket.

## 2012-08-30 DIAGNOSIS — N289 Disorder of kidney and ureter, unspecified: Secondary | ICD-10-CM | POA: Diagnosis not present

## 2012-08-30 DIAGNOSIS — D751 Secondary polycythemia: Secondary | ICD-10-CM | POA: Diagnosis not present

## 2012-08-30 DIAGNOSIS — E041 Nontoxic single thyroid nodule: Secondary | ICD-10-CM | POA: Diagnosis not present

## 2012-08-30 DIAGNOSIS — E785 Hyperlipidemia, unspecified: Secondary | ICD-10-CM | POA: Diagnosis not present

## 2012-08-30 LAB — TSH: TSH: 7.266 u[IU]/mL — ABNORMAL HIGH (ref 0.350–4.500)

## 2012-08-30 LAB — BASIC METABOLIC PANEL
BUN: 12 mg/dL (ref 6–23)
Chloride: 102 mEq/L (ref 96–112)
Creat: 1.2 mg/dL (ref 0.50–1.35)
Glucose, Bld: 98 mg/dL (ref 70–99)
Potassium: 3.9 mEq/L (ref 3.5–5.3)

## 2012-08-30 LAB — CBC WITH DIFFERENTIAL/PLATELET
Basophils Absolute: 0 10*3/uL (ref 0.0–0.1)
Basophils Relative: 1 % (ref 0–1)
Eosinophils Absolute: 0.4 10*3/uL (ref 0.0–0.7)
Eosinophils Relative: 7 % — ABNORMAL HIGH (ref 0–5)
Lymphs Abs: 1.3 10*3/uL (ref 0.7–4.0)
MCH: 32.9 pg (ref 26.0–34.0)
MCHC: 35.9 g/dL (ref 30.0–36.0)
MCV: 91.4 fL (ref 78.0–100.0)
Neutrophils Relative %: 63 % (ref 43–77)
Platelets: 181 10*3/uL (ref 150–400)
RBC: 5.72 MIL/uL (ref 4.22–5.81)
RDW: 13.7 % (ref 11.5–15.5)

## 2012-08-30 LAB — T4, FREE: Free T4: 1.02 ng/dL (ref 0.80–1.80)

## 2012-08-30 LAB — LIPID PANEL
Cholesterol: 135 mg/dL (ref 0–200)
Triglycerides: 160 mg/dL — ABNORMAL HIGH (ref <150)
VLDL: 32 mg/dL (ref 0–40)

## 2012-08-30 NOTE — Progress Notes (Signed)
Dr Lorin Picket spoke with at office visit on 08-29-2012

## 2012-09-02 DIAGNOSIS — J3089 Other allergic rhinitis: Secondary | ICD-10-CM | POA: Diagnosis not present

## 2012-09-09 ENCOUNTER — Other Ambulatory Visit: Payer: Self-pay | Admitting: Otolaryngology

## 2012-09-09 DIAGNOSIS — K219 Gastro-esophageal reflux disease without esophagitis: Secondary | ICD-10-CM | POA: Diagnosis not present

## 2012-09-09 DIAGNOSIS — R070 Pain in throat: Secondary | ICD-10-CM | POA: Diagnosis not present

## 2012-09-09 DIAGNOSIS — D449 Neoplasm of uncertain behavior of unspecified endocrine gland: Secondary | ICD-10-CM | POA: Diagnosis not present

## 2012-09-12 ENCOUNTER — Encounter (HOSPITAL_COMMUNITY): Payer: Self-pay | Admitting: Pharmacy Technician

## 2012-09-15 ENCOUNTER — Encounter (HOSPITAL_COMMUNITY): Payer: Self-pay

## 2012-09-15 ENCOUNTER — Encounter (HOSPITAL_COMMUNITY)
Admission: RE | Admit: 2012-09-15 | Discharge: 2012-09-15 | Disposition: A | Discharge status: Home / Self Care | Payer: Medicare Other | Source: Ambulatory Visit | Attending: Otolaryngology | Admitting: Otolaryngology

## 2012-09-15 ENCOUNTER — Ambulatory Visit (HOSPITAL_COMMUNITY)
Admission: RE | Admit: 2012-09-15 | Discharge: 2012-09-15 | Disposition: A | Discharge status: Home / Self Care | Payer: Medicare Other | Source: Ambulatory Visit | Attending: Otolaryngology | Admitting: Otolaryngology

## 2012-09-15 DIAGNOSIS — Z01812 Encounter for preprocedural laboratory examination: Secondary | ICD-10-CM | POA: Insufficient documentation

## 2012-09-15 DIAGNOSIS — Z01818 Encounter for other preprocedural examination: Secondary | ICD-10-CM | POA: Diagnosis not present

## 2012-09-15 DIAGNOSIS — Z01810 Encounter for preprocedural cardiovascular examination: Secondary | ICD-10-CM | POA: Diagnosis not present

## 2012-09-15 DIAGNOSIS — J984 Other disorders of lung: Secondary | ICD-10-CM | POA: Diagnosis not present

## 2012-09-15 HISTORY — DX: Hypothyroidism, unspecified: E03.9

## 2012-09-15 HISTORY — DX: Gastro-esophageal reflux disease without esophagitis: K21.9

## 2012-09-15 HISTORY — DX: Unspecified osteoarthritis, unspecified site: M19.90

## 2012-09-15 HISTORY — DX: Calculus of kidney: N20.0

## 2012-09-15 HISTORY — DX: Personal history of other medical treatment: Z92.89

## 2012-09-15 HISTORY — DX: Personal history of other diseases of the digestive system: Z87.19

## 2012-09-15 LAB — CBC
Hemoglobin: 19 g/dL — ABNORMAL HIGH (ref 13.0–17.0)
MCH: 33.6 pg (ref 26.0–34.0)
MCHC: 35.4 g/dL (ref 30.0–36.0)
RDW: 13.5 % (ref 11.5–15.5)

## 2012-09-15 LAB — BASIC METABOLIC PANEL
BUN: 9 mg/dL (ref 6–23)
Creatinine, Ser: 0.99 mg/dL (ref 0.50–1.35)
GFR calc Af Amer: >90 mL/min (ref >90)
GFR calc non Af Amer: 78 mL/min — ABNORMAL LOW (ref >90)
Glucose, Bld: 90 mg/dL (ref 70–99)

## 2012-09-15 LAB — SURGICAL PCR SCREEN: MRSA, PCR: NEGATIVE

## 2012-09-15 NOTE — Pre-Procedure Instructions (Signed)
M[facility] SCHUBRING  09/15/2012   Your procedure is scheduled on:  Wednesday, June 4th   Report to [facility] Stay Center at 0630 AM, come to main entrance "A" go to east elevators up to 3rd floor. Check in at short stay desk.   Call this number if you have problems the morning of surgery: [phone]   Remember:   Do not eat food or drink liquids after midnight.   Take these medicines the morning of surgery with A SIP OF WATER: Prilosec, Flonase, Neurontin, Symbicort, Albuterol if needed   Do not wear jewelry, make-up or nail polish.  Do not wear lotions, powders, or perfume,deodorant.  Do not shave 48 hours prior to surgery. Men may shave face and neck.  Do not bring valuables to the hospital.  [facility] is not responsible for any belongings or valuables.  Contacts, dentures or bridgework may not be worn into surgery.  Leave suitcase in the car. After surgery it may be brought to your room.  For patients admitted to the hospital, checkout time is 11:00 AM the day of discharge.   Patients discharged the day of surgery will not be allowed to drive home.   Special Instructions: Shower using CHG 2 nights before surgery and the night before surgery.  If you shower the day of surgery use CHG.  Use special wash - you have one bottle of CHG for all showers.  You should use approximately 1/3 of the bottle for each shower.   Please read over the following fact sheets that you were given: Pain Booklet, Coughing and Deep Breathing, MRSA Information and Surgical Site Infection Prevention

## 2012-09-16 DIAGNOSIS — J3089 Other allergic rhinitis: Secondary | ICD-10-CM | POA: Diagnosis not present

## 2012-09-16 NOTE — Progress Notes (Signed)
Anesthesia Chart Review:  Patient is a 78 year old male scheduled for right hemi-thyroidectomy on 09/17/12 by Dr. Suszanne Conners.  History includes non-smoker, asthma, OSA, nephrolithiasis, arthritis, GERD, hypothyroidism, hiatal hernia, prior back surgery.  He had mild 30-40% LAD narrowing by cath on 07/02/03, otherwise no significant stenosis with EF > 60%. He had an overall low risk exercise Myoview on 09/09/11ordered by cardiologist Dr. Diona Browner.  Notes indicate that if test was low risk, patient could continue medical therapy and follow-up with PCP Dr. Gerda Diss.  EKG on 09/15/12 showed NSR, non-specific lateral T wave abnormality.  I think his EKG is stable when compared to 12/08/09.  CXR on 09/15/12 showed no active disease of the chest.  Preoperative labs noted.  H/H are elevated, but appear stable since last month.  His PLT count is decreased at 137K.  Overall, I think they are acceptable for OR with continued follow-up with Dr. Lilyan Punt.  [name] [facility] Stay Center/Anesthesiology Phone [phone] 09/16/2012 12:37 PM

## 2012-09-17 ENCOUNTER — Encounter (HOSPITAL_COMMUNITY): Payer: Self-pay | Admitting: *Deleted

## 2012-09-17 ENCOUNTER — Encounter (HOSPITAL_COMMUNITY): Payer: Self-pay | Admitting: Vascular Surgery

## 2012-09-17 ENCOUNTER — Ambulatory Visit (HOSPITAL_COMMUNITY)
Admission: RE | Admit: 2012-09-17 | Discharge: 2012-09-18 | Disposition: A | Discharge status: Home / Self Care | Payer: Medicare Other | Source: Ambulatory Visit | Attending: Otolaryngology | Admitting: Otolaryngology

## 2012-09-17 ENCOUNTER — Encounter (HOSPITAL_COMMUNITY)
Admission: RE | Disposition: A | Discharge status: Home / Self Care | Payer: Self-pay | Source: Ambulatory Visit | Attending: Otolaryngology

## 2012-09-17 ENCOUNTER — Ambulatory Visit (HOSPITAL_COMMUNITY): Payer: Medicare Other | Admitting: Anesthesiology

## 2012-09-17 DIAGNOSIS — Z79899 Other long term (current) drug therapy: Secondary | ICD-10-CM | POA: Insufficient documentation

## 2012-09-17 DIAGNOSIS — I10 Essential (primary) hypertension: Secondary | ICD-10-CM | POA: Diagnosis not present

## 2012-09-17 DIAGNOSIS — D449 Neoplasm of uncertain behavior of unspecified endocrine gland: Secondary | ICD-10-CM | POA: Diagnosis not present

## 2012-09-17 DIAGNOSIS — E063 Autoimmune thyroiditis: Secondary | ICD-10-CM | POA: Diagnosis not present

## 2012-09-17 DIAGNOSIS — E041 Nontoxic single thyroid nodule: Secondary | ICD-10-CM | POA: Diagnosis not present

## 2012-09-17 DIAGNOSIS — K219 Gastro-esophageal reflux disease without esophagitis: Secondary | ICD-10-CM | POA: Diagnosis not present

## 2012-09-17 HISTORY — PX: THYROIDECTOMY: SHX17

## 2012-09-17 SURGERY — THYROIDECTOMY
Anesthesia: General | Site: Neck | Laterality: Right | Wound class: Clean

## 2012-09-17 MED ORDER — STERILE WATER FOR IRRIGATION IR SOLN
Status: DC | PRN
  Administered 2012-09-17: 1000 mL

## 2012-09-17 MED ORDER — DEXAMETHASONE SODIUM PHOSPHATE 10 MG/ML IJ SOLN
INTRAMUSCULAR | Status: DC | PRN
  Administered 2012-09-17: 8 mg via INTRAVENOUS

## 2012-09-17 MED ORDER — CLINDAMYCIN PHOSPHATE 600 MG/50ML IV SOLN
600.0000 mg | INTRAVENOUS | Status: DC
  Filled 2012-09-17: qty 50

## 2012-09-17 MED ORDER — BACITRACIN ZINC 500 UNIT/GM EX OINT
TOPICAL_OINTMENT | CUTANEOUS | Status: AC
  Filled 2012-09-17: qty 15

## 2012-09-17 MED ORDER — PROMETHAZINE HCL 25 MG PO TABS: 25.0000 mg | ORAL_TABLET | Freq: Four times a day (QID) | ORAL | Status: DC | PRN

## 2012-09-17 MED ORDER — OXYCODONE-ACETAMINOPHEN 5-325 MG PO TABS
1.0000 | ORAL_TABLET | ORAL | Status: DC | PRN
  Administered 2012-09-17 – 2012-09-18 (×3): 1 via ORAL
  Filled 2012-09-17: qty 2
  Filled 2012-09-17 (×2): qty 1

## 2012-09-17 MED ORDER — LIDOCAINE HCL 4 % MT SOLN
OROMUCOSAL | Status: DC | PRN
  Administered 2012-09-17: 4 mL via TOPICAL

## 2012-09-17 MED ORDER — LIDOCAINE HCL (CARDIAC) 20 MG/ML IV SOLN
INTRAVENOUS | Status: DC | PRN
  Administered 2012-09-17: 60 mg via INTRAVENOUS

## 2012-09-17 MED ORDER — LIDOCAINE-EPINEPHRINE 1 %-1:100000 IJ SOLN
INTRAMUSCULAR | Status: AC
  Filled 2012-09-17: qty 1

## 2012-09-17 MED ORDER — MONTELUKAST SODIUM 10 MG PO TABS
10.0000 mg | ORAL_TABLET | Freq: Every day | ORAL | Status: DC
  Administered 2012-09-17: 10 mg via ORAL
  Filled 2012-09-17 (×2): qty 1

## 2012-09-17 MED ORDER — HYDROMORPHONE HCL PF 1 MG/ML IJ SOLN
INTRAMUSCULAR | Status: AC
  Filled 2012-09-17: qty 1

## 2012-09-17 MED ORDER — LACTATED RINGERS IV SOLN
INTRAVENOUS | Status: DC | PRN
  Administered 2012-09-17 (×2): via INTRAVENOUS

## 2012-09-17 MED ORDER — SUCCINYLCHOLINE CHLORIDE 20 MG/ML IJ SOLN
INTRAMUSCULAR | Status: DC | PRN
  Administered 2012-09-17: 100 mg via INTRAVENOUS

## 2012-09-17 MED ORDER — IRBESARTAN 75 MG PO TABS
75.0000 mg | ORAL_TABLET | Freq: Every day | ORAL | Status: DC
  Administered 2012-09-17 – 2012-09-18 (×2): 75 mg via ORAL
  Filled 2012-09-17 (×3): qty 1

## 2012-09-17 MED ORDER — HYDROMORPHONE HCL PF 1 MG/ML IJ SOLN: 0.2500 mg | INTRAMUSCULAR | Status: DC | PRN

## 2012-09-17 MED ORDER — FENTANYL CITRATE 0.05 MG/ML IJ SOLN
INTRAMUSCULAR | Status: DC | PRN
  Administered 2012-09-17: 150 ug via INTRAVENOUS
  Administered 2012-09-17: 50 ug via INTRAVENOUS

## 2012-09-17 MED ORDER — PROMETHAZINE HCL 25 MG RE SUPP: 25.0000 mg | Freq: Four times a day (QID) | RECTAL | Status: DC | PRN

## 2012-09-17 MED ORDER — HYDROCHLOROTHIAZIDE 12.5 MG PO CAPS
12.5000 mg | ORAL_CAPSULE | Freq: Every day | ORAL | Status: DC
  Administered 2012-09-17 – 2012-09-18 (×2): 12.5 mg via ORAL
  Filled 2012-09-17 (×3): qty 1

## 2012-09-17 MED ORDER — PROPOFOL 10 MG/ML IV BOLUS
INTRAVENOUS | Status: DC | PRN
  Administered 2012-09-17: 150 mg via INTRAVENOUS

## 2012-09-17 MED ORDER — ALBUTEROL SULFATE HFA 108 (90 BASE) MCG/ACT IN AERS
2.0000 | INHALATION_SPRAY | Freq: Four times a day (QID) | RESPIRATORY_TRACT | Status: DC | PRN
  Filled 2012-09-17: qty 6.7

## 2012-09-17 MED ORDER — GABAPENTIN 400 MG PO CAPS
400.0000 mg | ORAL_CAPSULE | Freq: Three times a day (TID) | ORAL | Status: DC
  Administered 2012-09-17 – 2012-09-18 (×3): 400 mg via ORAL
  Filled 2012-09-17 (×6): qty 1

## 2012-09-17 MED ORDER — ACETAMINOPHEN 160 MG/5ML PO SOLN: 650.0000 mg | ORAL | Status: DC | PRN

## 2012-09-17 MED ORDER — BUDESONIDE-FORMOTEROL FUMARATE 160-4.5 MCG/ACT IN AERO
1.0000 | INHALATION_SPRAY | Freq: Two times a day (BID) | RESPIRATORY_TRACT | Status: DC
  Administered 2012-09-17 – 2012-09-18 (×2): 1 via RESPIRATORY_TRACT
  Filled 2012-09-17: qty 6

## 2012-09-17 MED ORDER — ARTIFICIAL TEARS OP OINT
TOPICAL_OINTMENT | OPHTHALMIC | Status: DC | PRN
  Administered 2012-09-17: 1 via OPHTHALMIC

## 2012-09-17 MED ORDER — HYDROMORPHONE HCL PF 1 MG/ML IJ SOLN
0.2500 mg | INTRAMUSCULAR | Status: DC | PRN
  Administered 2012-09-17 (×2): 0.5 mg via INTRAVENOUS

## 2012-09-17 MED ORDER — KCL IN DEXTROSE-NACL 20-5-0.45 MEQ/L-%-% IV SOLN
INTRAVENOUS | Status: DC
  Administered 2012-09-17 – 2012-09-18 (×2): via INTRAVENOUS
  Filled 2012-09-17 (×3): qty 1000

## 2012-09-17 MED ORDER — PHENYLEPHRINE HCL 10 MG/ML IJ SOLN
10.0000 mg | INTRAVENOUS | Status: DC | PRN
  Administered 2012-09-17: 20 ug/min via INTRAVENOUS

## 2012-09-17 MED ORDER — CLINDAMYCIN PHOSPHATE 600 MG/50ML IV SOLN
600.0000 mg | Freq: Once | INTRAVENOUS | Status: DC
  Administered 2012-09-17: 600 mg via INTRAVENOUS

## 2012-09-17 MED ORDER — PANTOPRAZOLE SODIUM 40 MG PO TBEC
40.0000 mg | DELAYED_RELEASE_TABLET | Freq: Every day | ORAL | Status: DC
  Administered 2012-09-17 – 2012-09-18 (×2): 40 mg via ORAL
  Filled 2012-09-17 (×2): qty 1

## 2012-09-17 MED ORDER — VALSARTAN-HYDROCHLOROTHIAZIDE 80-12.5 MG PO TABS: 1.0000 | ORAL_TABLET | Freq: Every morning | ORAL | Status: DC

## 2012-09-17 MED ORDER — HEMOSTATIC AGENTS (NO CHARGE) OPTIME
TOPICAL | Status: DC | PRN
  Administered 2012-09-17: 1 via TOPICAL

## 2012-09-17 MED ORDER — EPHEDRINE SULFATE 50 MG/ML IJ SOLN
INTRAMUSCULAR | Status: DC | PRN
  Administered 2012-09-17: 5 mg via INTRAVENOUS
  Administered 2012-09-17: 10 mg via INTRAVENOUS

## 2012-09-17 MED ORDER — MIDAZOLAM HCL 5 MG/5ML IJ SOLN
INTRAMUSCULAR | Status: DC | PRN
  Administered 2012-09-17 (×2): 1 mg via INTRAVENOUS

## 2012-09-17 MED ORDER — ACETAMINOPHEN 650 MG RE SUPP: 650.0000 mg | RECTAL | Status: DC | PRN

## 2012-09-17 MED ORDER — ZOLPIDEM TARTRATE 5 MG PO TABS: 5.0000 mg | ORAL_TABLET | Freq: Every evening | ORAL | Status: DC | PRN

## 2012-09-17 MED ORDER — 0.9 % SODIUM CHLORIDE (POUR BTL) OPTIME
TOPICAL | Status: DC | PRN
  Administered 2012-09-17: 1000 mL

## 2012-09-17 MED ORDER — SIMVASTATIN 20 MG PO TABS
20.0000 mg | ORAL_TABLET | Freq: Every day | ORAL | Status: DC
  Administered 2012-09-17: 20 mg via ORAL
  Filled 2012-09-17 (×2): qty 1

## 2012-09-17 MED ORDER — LIDOCAINE-EPINEPHRINE 1 %-1:100000 IJ SOLN
INTRAMUSCULAR | Status: DC | PRN
  Administered 2012-09-17: 4 mL

## 2012-09-17 MED ORDER — CLINDAMYCIN PHOSPHATE 300 MG/50ML IV SOLN
300.0000 mg | Freq: Three times a day (TID) | INTRAVENOUS | Status: AC
  Administered 2012-09-17 – 2012-09-18 (×3): 300 mg via INTRAVENOUS
  Filled 2012-09-17 (×3): qty 50

## 2012-09-17 MED ORDER — MORPHINE SULFATE 2 MG/ML IJ SOLN: 2.0000 mg | INTRAMUSCULAR | Status: DC | PRN

## 2012-09-17 MED ORDER — PHENYLEPHRINE HCL 10 MG/ML IJ SOLN
INTRAMUSCULAR | Status: DC | PRN
  Administered 2012-09-17: 80 ug via INTRAVENOUS

## 2012-09-17 MED ORDER — ONDANSETRON HCL 4 MG/2ML IJ SOLN
INTRAMUSCULAR | Status: DC | PRN
  Administered 2012-09-17: 4 mg via INTRAVENOUS

## 2012-09-17 MED ORDER — BACITRACIN ZINC 500 UNIT/GM EX OINT
TOPICAL_OINTMENT | CUTANEOUS | Status: DC | PRN
  Administered 2012-09-17: 1 via TOPICAL

## 2012-09-17 SURGICAL SUPPLY — 49 items
ADH SKN CLS APL DERMABOND .7 (GAUZE/BANDAGES/DRESSINGS) ×1
ATTRACTOMAT 16X20 MAGNETIC DRP (DRAPES) ×1 IMPLANT
BLADE SURG 15 STRL LF DISP TIS (BLADE) IMPLANT
BLADE SURG 15 STRL SS (BLADE)
BLADE SURG CLIPPER 3M 9600 (MISCELLANEOUS) IMPLANT
CANISTER SUCTION 2500CC (MISCELLANEOUS) ×2 IMPLANT
CLEANER TIP ELECTROSURG 2X2 (MISCELLANEOUS) ×2 IMPLANT
CLIP TI WIDE RED SMALL 24 (CLIP) IMPLANT
CLOTH BEACON ORANGE TIMEOUT ST (SAFETY) ×2 IMPLANT
CONT SPEC 4OZ CLIKSEAL STRL BL (MISCELLANEOUS) ×1 IMPLANT
CORDS BIPOLAR (ELECTRODE) ×2 IMPLANT
COVER SURGICAL LIGHT HANDLE (MISCELLANEOUS) ×2 IMPLANT
CRADLE DONUT ADULT HEAD (MISCELLANEOUS) ×1 IMPLANT
DERMABOND ADVANCED (GAUZE/BANDAGES/DRESSINGS) ×1
DERMABOND ADVANCED .7 DNX12 (GAUZE/BANDAGES/DRESSINGS) ×1 IMPLANT
DRAIN CHANNEL 10F 3/8 F FF (DRAIN) ×2 IMPLANT
ELECT COATED BLADE 2.86 ST (ELECTRODE) ×2 IMPLANT
ELECT REM PT RETURN 9FT ADLT (ELECTROSURGICAL) ×2
ELECTRODE REM PT RTRN 9FT ADLT (ELECTROSURGICAL) ×1 IMPLANT
EVACUATOR SILICONE 100CC (DRAIN) ×1 IMPLANT
GAUZE SPONGE 4X4 16PLY XRAY LF (GAUZE/BANDAGES/DRESSINGS) ×5 IMPLANT
GLOVE BIO SURGEON STRL SZ 6.5 (GLOVE) ×1 IMPLANT
GLOVE BIOGEL PI IND STRL 6.5 (GLOVE) IMPLANT
GLOVE BIOGEL PI IND STRL 7.0 (GLOVE) IMPLANT
GLOVE BIOGEL PI INDICATOR 6.5 (GLOVE) ×1
GLOVE BIOGEL PI INDICATOR 7.0 (GLOVE) ×2
GLOVE ECLIPSE 7.5 STRL STRAW (GLOVE) ×2 IMPLANT
GLOVE SURG SS PI 7.0 STRL IVOR (GLOVE) ×2 IMPLANT
GOWN STRL NON-REIN LRG LVL3 (GOWN DISPOSABLE) ×5 IMPLANT
HEMOSTAT SURGICEL .5X2 ABSORB (HEMOSTASIS) ×1 IMPLANT
KIT BASIN OR (CUSTOM PROCEDURE TRAY) ×2 IMPLANT
KIT ROOM TURNOVER OR (KITS) ×2 IMPLANT
LOCATOR NERVE 3 VOLT (DISPOSABLE) ×1 IMPLANT
NS IRRIG 1000ML POUR BTL (IV SOLUTION) ×2 IMPLANT
PAD ARMBOARD 7.5X6 YLW CONV (MISCELLANEOUS) ×4 IMPLANT
PENCIL BUTTON HOLSTER BLD 10FT (ELECTRODE) ×2 IMPLANT
SHEARS HARMONIC 9CM CVD (BLADE) ×1 IMPLANT
SPONGE INTESTINAL PEANUT (DISPOSABLE) ×3 IMPLANT
STAPLER VISISTAT 35W (STAPLE) ×2 IMPLANT
SUT ETHILON 2 0 FS 18 (SUTURE) ×1 IMPLANT
SUT SILK 2 0 FS (SUTURE) ×2 IMPLANT
SUT SILK 2 0 REEL (SUTURE) IMPLANT
SUT SILK 3 0 REEL (SUTURE) ×2 IMPLANT
SUT VICRYL 4-0 PS2 18IN ABS (SUTURE) ×2 IMPLANT
TOWEL OR 17X24 6PK STRL BLUE (TOWEL DISPOSABLE) ×2 IMPLANT
TOWEL OR 17X26 10 PK STRL BLUE (TOWEL DISPOSABLE) ×2 IMPLANT
TRAY ENT MC OR (CUSTOM PROCEDURE TRAY) ×2 IMPLANT
TRAY FOLEY CATH 14FRSI W/METER (CATHETERS) IMPLANT
WATER STERILE IRR 1000ML POUR (IV SOLUTION) ×2 IMPLANT

## 2012-09-17 NOTE — Anesthesia Postprocedure Evaluation (Signed)
  Anesthesia Post-op Note  Patient: Thomas [name]  Procedure(s) Performed: Procedure(s): RIGHT HEMI-THYROIDECTOMY (Right)  Patient Location: PACU  Anesthesia Type:General  Level of Consciousness: awake  Airway and Oxygen Therapy: Patient Spontanous Breathing  Post-op Pain: mild  Post-op Assessment: Post-op Vital signs reviewed  Post-op Vital Signs: Reviewed  Complications: No apparent anesthesia complications

## 2012-09-17 NOTE — H&P (Signed)
  H&P Update  Pt's original H&P dated 09/09/12 reviewed and placed in chart (to be scanned).  I personally examined the patient today.  No change in health. Proceed with right hemithyroidectomy.

## 2012-09-17 NOTE — Anesthesia Preprocedure Evaluation (Addendum)
Anesthesia Evaluation   Patient awake    Reviewed: Allergy & Precautions, H&P , NPO status , Patient's Chart, lab work & pertinent test results  History of Anesthesia Complications Negative for: history of anesthetic complications  Airway Mallampati: II TM Distance: >3 FB Neck ROM: Full    Dental  (+) Teeth Intact and Dental Advisory Given   Pulmonary asthma , sleep apnea ,          Cardiovascular hypertension, Pt. on medications + CAD     Neuro/Psych negative neurological ROS     GI/Hepatic Neg liver ROS, hiatal hernia, GERD-  Medicated and Controlled,  Endo/Other  Hypothyroidism   Renal/GU Renal disease     Musculoskeletal   Abdominal   Peds  Hematology negative hematology ROS (+)   Anesthesia Other Findings   Reproductive/Obstetrics negative OB ROS                          Anesthesia Physical Anesthesia Plan  ASA: III  Anesthesia Plan: General   Post-op Pain Management:    Induction:   Airway Management Planned: Oral ETT  Additional Equipment:   Intra-op Plan:   Post-operative Plan: Possible Post-op intubation/ventilation  Informed Consent: I have reviewed the patients History and Physical, chart, labs and discussed the procedure including the risks, benefits and alternatives for the proposed anesthesia with the patient or authorized representative who has indicated his/her understanding and acceptance.     Plan Discussed with: CRNA and Anesthesiologist  Anesthesia Plan Comments:         Anesthesia Quick Evaluation

## 2012-09-17 NOTE — Preoperative (Signed)
Beta Blockers   Reason not to administer Beta Blockers:Not Applicable 

## 2012-09-17 NOTE — Brief Op Note (Signed)
09/17/2012  10:23 AM  PATIENT:  Thomas [name]  78 y.o. male  PRE-OPERATIVE DIAGNOSIS:  RIGHT THYROID MASS  POST-OPERATIVE DIAGNOSIS:  RIGHT THYROID MASS  PROCEDURE:  Procedure(s): RIGHT HEMI-THYROIDECTOMY (Right)  SURGEON:  Surgeon(s) and Role:    * Darletta Moll, MD - Primary  PHYSICIAN ASSISTANT: Roma Schanz, PA-C   ASSISTANTS: Roma Schanz, PA-C   ANESTHESIA:   general  EBL:  Total I/O In: 1000 [I.V.:1000] Out: -   BLOOD ADMINISTERED:none  DRAINS: (#10) Jackson-Pratt drain(s) with closed bulb suction in the neck   LOCAL MEDICATIONS USED:  LIDOCAINE  and Amount: 4 ml  SPECIMEN:  Source of Specimen:  Right thyroid lobe  DISPOSITION OF SPECIMEN:  PATHOLOGY  COUNTS:  YES  TOURNIQUET:  * No tourniquets in log *  DICTATION: .Other Dictation: Dictation Number [id number]  PLAN OF CARE: Admit for overnight observation  PATIENT DISPOSITION:  PACU - hemodynamically stable.   Delay start of Pharmacological VTE agent (>24hrs) due to surgical blood loss or risk of bleeding: not applicable

## 2012-09-17 NOTE — Anesthesia Procedure Notes (Addendum)
Procedure Name: Intubation Date/Time: 09/17/2012 8:38 AM Performed by: Lovie Chol Pre-anesthesia Checklist: Patient identified, Emergency Drugs available, Suction available, Patient being monitored and Timeout performed Patient Re-evaluated:Patient Re-evaluated prior to inductionOxygen Delivery Method: Circle system utilized Preoxygenation: Pre-oxygenation with 100% oxygen Intubation Type: IV induction Ventilation: Mask ventilation without difficulty Laryngoscope Size: Miller and 2 Grade View: Grade I Tube type: Oral Tube size: 7.5 mm Number of attempts: 1 Airway Equipment and Method: Stylet and LTA kit utilized Placement Confirmation: ETT inserted through vocal cords under direct vision,  positive ETCO2,  CO2 detector and breath sounds checked- equal and bilateral Secured at: 23 cm Tube secured with: Tape Dental Injury: Teeth and Oropharynx as per pre-operative assessment

## 2012-09-17 NOTE — Transfer of Care (Signed)
Immediate Anesthesia Transfer of Care Note  Patient: Thomas [name]  Procedure(s) Performed: Procedure(s): RIGHT HEMI-THYROIDECTOMY (Right)  Patient Location: PACU  Anesthesia Type:General  Level of Consciousness: oriented, sedated and patient cooperative  Airway & Oxygen Therapy: Patient Spontanous Breathing and Patient connected to nasal cannula oxygen  Post-op Assessment: Report given to PACU RN and Post -op Vital signs reviewed and stable  Post vital signs: Reviewed and stable  Complications: No apparent anesthesia complications

## 2012-09-18 MED ORDER — OXYCODONE-ACETAMINOPHEN 5-325 MG PO TABS: 1.0000 | ORAL_TABLET | ORAL | Status: DC | PRN

## 2012-09-18 NOTE — Op Note (Signed)
NAMEBYAN, POPLASKI              ACCOUNT NO.:  [id number]  MEDICAL RECORD NO.:  [id number]  LOCATION:  6N12C                        FACILITY:  [facility]  PHYSICIAN:  Newman Pies, MD            DATE OF BIRTH:  15-Nov-1934  DATE OF PROCEDURE:  09/17/2012 DATE OF DISCHARGE:                              OPERATIVE REPORT   SURGEON:  Newman Pies, MD.  ASSISTANT:  Roma Schanz, PA-C.  PREOPERATIVE DIAGNOSIS:  Right thyroid mass.  POSTOPERATIVE DIAGNOSIS:  Right thyroid mass.  PROCEDURE PERFORMED:  Right hemithyroidectomy.  ANESTHESIA:  General endotracheal tube anesthesia.  COMPLICATIONS:  None.  ESTIMATED BLOOD LOSS:  Minimal.  INDICATION FOR PROCEDURE:  The patient is a 78 year old male, who was noted to have a right thyroid nodule incidentally on his chest CT scan. His subsequent fine-needle aspiration biopsy was nondiagnostic. However, some nuclear atypia was noted.  Based on the above findings, the decision was made for the patient to undergo the hemithyroidectomy procedure for definitive tissue diagnosis.  The risks, benefits, alternatives, and details of the procedure were discussed with the patient.  Questions were invited and answered.  Informed consent was obtained.  DESCRIPTION OF PROCEDURE:  The patient was taken to the operating room and placed supine on the operating table.  General endotracheal tube anesthesia was administered by the anesthesiologist.  Preop IV antibiotics was given.  The patient was positioned and prepped and draped in a standard fashion for thyroid surgery.  A 1% lidocaine 1:100,000 epinephrine was injected at the planned site of incision.  A low transverse neck incision was made.  The incision was carried down to the level of the platysma muscles.  Superior and inferiorly based subplatysmal flaps were elevated in a standard fashion.  The strap muscles were divided at midline and retracted laterally, exposing the thyroid gland.  Careful dissection  was carried out to dissect the right thyroid lobe free from the surrounding soft tissue.  The recurrent laryngeal nerve was identified and preserved.  The middle thyroid vein was ligated.  Parathyroid glands were identified and preserved as well. The entire right thyroid lobe, together with half of the isthmus were removed and sent to the Pathology Department for frozen section analysis.  The frozen section analysis was suggestive of lymphocytic thyroiditis.  No definitive malignancy was noted.  The surgical site was copiously irrigated.  The strap muscles were closed with 4-0 Vicryl sutures.  A #10 JP drain was placed.  The incision was closed in layers with 4-0 Vicryl and Dermabond.  The care the patient was turned over to the anesthesiologist. The patient was awakened from anesthesia without difficulty.  He was extubated and transferred to the recovery room in good condition.  OPERATIVE FINDINGS:  A 2 cm right inferior thyroid nodule was noted. The entire right thyroid lobe was removed.  SPECIMEN:  Right thyroid lobe.  FOLLOWUP CARE:  The patient will be observed overnight in the hospital. His JP drain will be removed tomorrow.  He will likely be discharged home on postop day #1.     Newman Pies, MD     ST/MEDQ  D:  09/17/2012  T:  09/18/2012  Job:  [id number]  cc:   Lorin Picket A. Gerda Diss, MD

## 2012-09-18 NOTE — Progress Notes (Signed)
Pt discharged to home

## 2012-09-18 NOTE — Discharge Summary (Signed)
Physician Discharge Summary  Patient ID: Thomas [name] MRN: [id number] DOB/AGE: 76-Apr-1936 76 y.o.  Admit date: 09/17/2012 Discharge date: 09/18/2012  Admission Diagnoses: Right thyroid mass   Discharge Diagnoses: Right thyroid mass Active Problems:   * No active hospital problems. *   Discharged Condition: good  Hospital Course: Pt had an uneventful overnight stay. Pt tolerated po well. No bleeding/hematoma. No stridor. JP drain removed.  Consults: None  Significant Diagnostic Studies: none  Treatments: surgery: Right hemithyroidectomy  Discharge Exam: Blood pressure 96/52, pulse 73, temperature 98 F (36.7 C), temperature source Oral, resp. rate 18, height 5\' 8"  (1.727 m), weight 89.9 kg (198 lb 3.1 oz), SpO2 95.00%. Incision/Wound: C/D/I No hematoma. Voice is strong.  Disposition:   Discharge Orders   Future Orders Complete By Expires     Diet general  As directed     Increase activity slowly  As directed         Medication List    STOP taking these medications       naproxen sodium 220 MG tablet  Commonly known as:  ANAPROX      TAKE these medications       budesonide-formoterol 160-4.5 MCG/ACT inhaler  Commonly known as:  SYMBICORT  Inhale 1 puff into the lungs 2 (two) times daily.     fluticasone 50 MCG/ACT nasal spray  Commonly known as:  FLONASE  Place 2 sprays into the nose daily as needed. For nasal congestion     gabapentin 400 MG capsule  Commonly known as:  NEURONTIN  Take 400 mg by mouth 3 (three) times daily.     montelukast 10 MG tablet  Commonly known as:  SINGULAIR  Take 10 mg by mouth at bedtime.     omeprazole 20 MG capsule  Commonly known as:  PRILOSEC  Take 20 mg by mouth daily with supper.     oxyCODONE-acetaminophen 5-325 MG per tablet  Commonly known as:  ROXICET  Take 1 tablet by mouth every 4 (four) hours as needed for pain.     pravastatin 40 MG tablet  Commonly known as:  PRAVACHOL  Take 40 mg by mouth at bedtime.      PRESCRIPTION MEDICATION  Allergy shot weekly at Dr. Juanetta Gosling office     PROAIR HFA 108 (90 BASE) MCG/ACT inhaler  Generic drug:  albuterol  Inhale 2 puffs into the lungs every 6 (six) hours as needed for wheezing. For wheezing     valsartan-hydrochlorothiazide 80-12.5 MG per tablet  Commonly known as:  DIOVAN-HCT  Take 1 tablet by mouth every morning.           Follow-up Information   Follow up with DR Khilynn Borntreger Philomena Doheny On 09/25/2012. (at 1pm (note the new time and location))    Contact information:   [address] [place] [address]       Signed: Darletta Moll 09/18/2012, 8:27 AM

## 2012-09-19 ENCOUNTER — Encounter (HOSPITAL_COMMUNITY): Payer: Self-pay | Admitting: Otolaryngology

## 2012-09-25 DIAGNOSIS — J3089 Other allergic rhinitis: Secondary | ICD-10-CM | POA: Diagnosis not present

## 2012-10-02 ENCOUNTER — Ambulatory Visit (INDEPENDENT_AMBULATORY_CARE_PROVIDER_SITE_OTHER): Payer: Medicare Other | Admitting: Otolaryngology

## 2012-10-02 DIAGNOSIS — J3089 Other allergic rhinitis: Secondary | ICD-10-CM | POA: Diagnosis not present

## 2012-10-15 ENCOUNTER — Telehealth: Payer: Self-pay | Admitting: Family Medicine

## 2012-10-15 DIAGNOSIS — R6889 Other general symptoms and signs: Secondary | ICD-10-CM

## 2012-10-15 DIAGNOSIS — Z79899 Other long term (current) drug therapy: Secondary | ICD-10-CM

## 2012-10-15 NOTE — Telephone Encounter (Signed)
[facility]bc met7 liv

## 2012-10-15 NOTE — Telephone Encounter (Signed)
Needs paperwork mailed to him so he can have blood work completed for Hemoglobin and whatever else Dr. Lorin Picket thinks he may need

## 2012-10-16 DIAGNOSIS — Z79899 Other long term (current) drug therapy: Secondary | ICD-10-CM | POA: Diagnosis not present

## 2012-10-16 DIAGNOSIS — R6889 Other general symptoms and signs: Secondary | ICD-10-CM | POA: Diagnosis not present

## 2012-10-16 NOTE — Telephone Encounter (Signed)
Blood work papers printed and mailed to patient. 

## 2012-10-20 DIAGNOSIS — J45909 Unspecified asthma, uncomplicated: Secondary | ICD-10-CM | POA: Diagnosis not present

## 2012-10-20 DIAGNOSIS — J3089 Other allergic rhinitis: Secondary | ICD-10-CM | POA: Diagnosis not present

## 2012-10-20 DIAGNOSIS — K219 Gastro-esophageal reflux disease without esophagitis: Secondary | ICD-10-CM | POA: Diagnosis not present

## 2012-10-21 DIAGNOSIS — J3089 Other allergic rhinitis: Secondary | ICD-10-CM | POA: Diagnosis not present

## 2012-11-06 ENCOUNTER — Other Ambulatory Visit: Payer: Self-pay | Admitting: Family Medicine

## 2012-11-06 DIAGNOSIS — J3089 Other allergic rhinitis: Secondary | ICD-10-CM | POA: Diagnosis not present

## 2012-11-11 DIAGNOSIS — Z961 Presence of intraocular lens: Secondary | ICD-10-CM | POA: Diagnosis not present

## 2012-11-12 DIAGNOSIS — Z79899 Other long term (current) drug therapy: Secondary | ICD-10-CM | POA: Diagnosis not present

## 2012-11-12 DIAGNOSIS — R6889 Other general symptoms and signs: Secondary | ICD-10-CM | POA: Diagnosis not present

## 2012-11-13 LAB — HEPATIC FUNCTION PANEL
ALT: 38 U/L (ref 0–53)
Albumin: 4.3 g/dL (ref 3.5–5.2)
Alkaline Phosphatase: 55 U/L (ref 39–117)
Total Protein: 6.5 g/dL (ref 6.0–8.3)

## 2012-11-13 LAB — CBC WITH DIFFERENTIAL/PLATELET
Eosinophils Absolute: 0.3 10*3/uL (ref 0.0–0.7)
Eosinophils Relative: 6 % — ABNORMAL HIGH (ref 0–5)
HCT: 49.7 % (ref 39.0–52.0)
Hemoglobin: 17.2 g/dL — ABNORMAL HIGH (ref 13.0–17.0)
Lymphocytes Relative: 28 % (ref 12–46)
Lymphs Abs: 1.3 10*3/uL (ref 0.7–4.0)
MCH: 33.1 pg (ref 26.0–34.0)
MCV: 95.8 fL (ref 78.0–100.0)
Monocytes Absolute: 0.4 10*3/uL (ref 0.1–1.0)
Monocytes Relative: 8 % (ref 3–12)
RBC: 5.19 MIL/uL (ref 4.22–5.81)
WBC: 4.6 10*3/uL (ref 4.0–10.5)

## 2012-11-13 LAB — BASIC METABOLIC PANEL
CO2: 28 mEq/L (ref 19–32)
Calcium: 9.1 mg/dL (ref 8.4–10.5)
Chloride: 102 mEq/L (ref 96–112)
Creat: 1.18 mg/dL (ref 0.50–1.35)
Glucose, Bld: 98 mg/dL (ref 70–99)

## 2012-11-20 DIAGNOSIS — J3089 Other allergic rhinitis: Secondary | ICD-10-CM | POA: Diagnosis not present

## 2012-11-29 ENCOUNTER — Other Ambulatory Visit: Payer: Self-pay | Admitting: Family Medicine

## 2012-12-02 ENCOUNTER — Encounter: Payer: Self-pay | Admitting: Family Medicine

## 2012-12-02 ENCOUNTER — Ambulatory Visit (INDEPENDENT_AMBULATORY_CARE_PROVIDER_SITE_OTHER): Payer: Medicare Other | Admitting: Family Medicine

## 2012-12-02 VITALS — BP 128/76 | Ht 68.0 in | Wt 201.0 lb

## 2012-12-02 DIAGNOSIS — E039 Hypothyroidism, unspecified: Secondary | ICD-10-CM

## 2012-12-02 DIAGNOSIS — D539 Nutritional anemia, unspecified: Secondary | ICD-10-CM | POA: Diagnosis not present

## 2012-12-02 DIAGNOSIS — D45 Polycythemia vera: Secondary | ICD-10-CM

## 2012-12-02 MED ORDER — GABAPENTIN 400 MG PO CAPS: 400.0000 mg | ORAL_CAPSULE | Freq: Four times a day (QID) | ORAL | Status: DC

## 2012-12-02 NOTE — Progress Notes (Addendum)
  Subjective:    Patient ID: Thomas [name], male    DOB: 09-05-1934, 78 y.o.   MRN: [id number]  HPIHere to go over blood work results. No other concerns. We have been following his CBC recently. Hemoglobin has been as high as 19 currently at 17.2 he denies any headaches chest pain shortness of breath. Denies any family history of this problem. States his energy level doing well. Recently he had had his thyroid removed.  Review of Systems     Objective:   Physical Exam Lungs clear heart regular Abdomen soft no splenomegaly out felt       Assessment & Plan:  Polycythemia -- I. recommend checking CBC. In addition to this B12 and ferritin. If this is significantly elevated referral to hematology. If similar to recent resolve will follow again in 3 months   It should be noted that the above dictation was completed by myself. The first line was transcribed by the nurse. I agree with it.SA Luking MD Thyroid room-he had a half of it removed. Check TSH in the near future.

## 2012-12-04 DIAGNOSIS — J3089 Other allergic rhinitis: Secondary | ICD-10-CM | POA: Diagnosis not present

## 2012-12-08 ENCOUNTER — Other Ambulatory Visit: Payer: Self-pay | Admitting: Family Medicine

## 2012-12-29 DIAGNOSIS — D45 Polycythemia vera: Secondary | ICD-10-CM | POA: Diagnosis not present

## 2012-12-29 DIAGNOSIS — E039 Hypothyroidism, unspecified: Secondary | ICD-10-CM | POA: Diagnosis not present

## 2012-12-29 LAB — CBC WITH DIFFERENTIAL/PLATELET
Basophils Absolute: 0 10*3/uL (ref 0.0–0.1)
Eosinophils Absolute: 0.4 10*3/uL (ref 0.0–0.7)
Eosinophils Relative: 8 % — ABNORMAL HIGH (ref 0–5)
Lymphs Abs: 1.2 10*3/uL (ref 0.7–4.0)
MCH: 34.2 pg — ABNORMAL HIGH (ref 26.0–34.0)
MCV: 97.4 fL (ref 78.0–100.0)
Neutrophils Relative %: 57 % (ref 43–77)
Platelets: 168 10*3/uL (ref 150–400)
RBC: 5.29 MIL/uL (ref 4.22–5.81)
RDW: 13.6 % (ref 11.5–15.5)
WBC: 4.8 10*3/uL (ref 4.0–10.5)

## 2012-12-30 LAB — FERRITIN: Ferritin: 183 ng/mL (ref 22–322)

## 2013-01-01 ENCOUNTER — Other Ambulatory Visit: Payer: Self-pay | Admitting: *Deleted

## 2013-01-01 DIAGNOSIS — E039 Hypothyroidism, unspecified: Secondary | ICD-10-CM

## 2013-01-01 DIAGNOSIS — J3089 Other allergic rhinitis: Secondary | ICD-10-CM | POA: Diagnosis not present

## 2013-01-01 DIAGNOSIS — Z23 Encounter for immunization: Secondary | ICD-10-CM | POA: Diagnosis not present

## 2013-01-01 MED ORDER — LEVOTHYROXINE SODIUM 50 MCG PO TABS: 50.0000 ug | ORAL_TABLET | Freq: Every day | ORAL | Status: DC

## 2013-01-14 ENCOUNTER — Encounter: Payer: Self-pay | Admitting: Family Medicine

## 2013-01-29 DIAGNOSIS — J3089 Other allergic rhinitis: Secondary | ICD-10-CM | POA: Diagnosis not present

## 2013-02-04 DIAGNOSIS — E039 Hypothyroidism, unspecified: Secondary | ICD-10-CM | POA: Diagnosis not present

## 2013-02-05 DIAGNOSIS — J309 Allergic rhinitis, unspecified: Secondary | ICD-10-CM | POA: Diagnosis not present

## 2013-02-10 DIAGNOSIS — J3089 Other allergic rhinitis: Secondary | ICD-10-CM | POA: Diagnosis not present

## 2013-02-10 MED ORDER — LEVOTHYROXINE SODIUM 75 MCG PO TABS: 75.0000 ug | ORAL_TABLET | Freq: Every day | ORAL | Status: DC

## 2013-02-10 NOTE — Addendum Note (Signed)
Addended by: Margaretha Sheffield on: 02/10/2013 10:45 AM   Modules accepted: Orders

## 2013-02-24 DIAGNOSIS — J3089 Other allergic rhinitis: Secondary | ICD-10-CM | POA: Diagnosis not present

## 2013-03-07 ENCOUNTER — Other Ambulatory Visit: Payer: Self-pay | Admitting: Family Medicine

## 2013-03-17 DIAGNOSIS — J3089 Other allergic rhinitis: Secondary | ICD-10-CM | POA: Diagnosis not present

## 2013-03-26 DIAGNOSIS — J3089 Other allergic rhinitis: Secondary | ICD-10-CM | POA: Diagnosis not present

## 2013-04-13 ENCOUNTER — Telehealth: Payer: Self-pay | Admitting: Family Medicine

## 2013-04-13 DIAGNOSIS — E039 Hypothyroidism, unspecified: Secondary | ICD-10-CM

## 2013-04-13 NOTE — Telephone Encounter (Signed)
Patient is due bloodwork for thyroid-Please mail patient paperwork

## 2013-04-13 NOTE — Telephone Encounter (Signed)
Order for Thomas [name] mailed to patient. He was notified on voicemail.

## 2013-04-14 DIAGNOSIS — J3089 Other allergic rhinitis: Secondary | ICD-10-CM | POA: Diagnosis not present

## 2013-04-24 ENCOUNTER — Other Ambulatory Visit (INDEPENDENT_AMBULATORY_CARE_PROVIDER_SITE_OTHER): Payer: Self-pay | Admitting: Internal Medicine

## 2013-04-29 DIAGNOSIS — J3089 Other allergic rhinitis: Secondary | ICD-10-CM | POA: Diagnosis not present

## 2013-05-10 ENCOUNTER — Other Ambulatory Visit: Payer: Self-pay | Admitting: Family Medicine

## 2013-05-11 ENCOUNTER — Encounter: Payer: Self-pay | Admitting: Family Medicine

## 2013-05-11 DIAGNOSIS — E039 Hypothyroidism, unspecified: Secondary | ICD-10-CM | POA: Diagnosis not present

## 2013-05-11 LAB — TSH: TSH: 1.37 u[IU]/mL (ref 0.350–4.500)

## 2013-05-12 DIAGNOSIS — J3089 Other allergic rhinitis: Secondary | ICD-10-CM | POA: Diagnosis not present

## 2013-05-26 DIAGNOSIS — J3089 Other allergic rhinitis: Secondary | ICD-10-CM | POA: Diagnosis not present

## 2013-06-01 ENCOUNTER — Ambulatory Visit (INDEPENDENT_AMBULATORY_CARE_PROVIDER_SITE_OTHER): Payer: Medicare Other | Admitting: Internal Medicine

## 2013-06-01 ENCOUNTER — Encounter (INDEPENDENT_AMBULATORY_CARE_PROVIDER_SITE_OTHER): Payer: Self-pay | Admitting: Internal Medicine

## 2013-06-01 VITALS — BP 108/74 | HR 64 | Temp 97.6°F | Ht 68.0 in | Wt 196.0 lb

## 2013-06-01 DIAGNOSIS — K219 Gastro-esophageal reflux disease without esophagitis: Secondary | ICD-10-CM

## 2013-06-01 DIAGNOSIS — E039 Hypothyroidism, unspecified: Secondary | ICD-10-CM | POA: Insufficient documentation

## 2013-06-01 MED ORDER — OMEPRAZOLE 20 MG PO CPDR: 20.0000 mg | DELAYED_RELEASE_CAPSULE | Freq: Every day | ORAL | Status: DC

## 2013-06-01 MED ORDER — OMEPRAZOLE 20 MG PO CPDR: DELAYED_RELEASE_CAPSULE | ORAL | Status: DC

## 2013-06-01 NOTE — Patient Instructions (Signed)
Refill Omeprazole x 1 yrs. OV in 2 yr.

## 2013-06-01 NOTE — Progress Notes (Signed)
Subjective:     Patient ID: Thomas [name], male   DOB: 18-Apr-1934, 79 y.o.   MRN: [id number]  HPI Here today for f/u of his chronic GERD. He was last seen in October of 2013. No dysphagia. Sometimes he will have chest pain across his chest 10-12 days a go.  Symptoms lasted about 3 weeks. Appetite is good. No weight loss. No abdominal pain .  Usually has a BM daily. No melena or bright red rectal bleeding.  He exercises daily in the home. He is retired from [employer].  He see a chiropractor weekly for his back.  06/2005 Colonoscopy with polypectomy: FINAL DIAGNOSES:  1. A 5-to-6-mm polyp snared from the cecum.  2. A few tiny diverticula at the sigmoid colon.  Biopsy: 1) polyp (Cecum, polypectomy): SMALL INTRAMUCOSAL LEIOMYOMA WITH INFLAMMATION OF OVERLYING MUCOSA. SEE MICROSCOPIC.  Review of Systems see hpi Past Medical History  Diagnosis Date  . Asthma   . Pollen allergies   . Kidney stones   . Hypertension   . Sleep apnea     mild no cpap  . Hernia   . History of stress test 2011  . Hypothyroidism   . GERD (gastroesophageal reflux disease)   . Renal calculus     some removed by Cystoscopy, one passed spontaneously  . H/O hiatal hernia   . Arthritis     "my entire back is gone"    Past Surgical History  Procedure Laterality Date  . Back surgery      L-4 , L-5  patient states that it was years ago.  Marland Kitchen Appendectomy      50 years ago  . Colonoscopy      at age 53  . Upper gastrointestinal endoscopy      egd/ed  . Eye surgery      cataracts removed- bilateral- /w IOL  . Thyroidectomy Right 09/17/2012    Procedure: RIGHT HEMI-THYROIDECTOMY;  Surgeon: Ascencion Dike, MD;  Location: [facility];  Service: ENT;  Laterality: Right;    Allergies  Allergen Reactions  . Penicillins Other (See Comments)    Reaction many years ago-unknown reaction    Current Outpatient Prescriptions on File Prior to Visit  Medication Sig Dispense Refill  . albuterol (PROAIR HFA) 108 (90 BASE)  MCG/ACT inhaler Inhale 2 puffs into the lungs every 6 (six) hours as needed for wheezing. For wheezing      . budesonide-formoterol (SYMBICORT) 160-4.5 MCG/ACT inhaler Inhale 1 puff into the lungs 2 (two) times daily.       . fluticasone (FLONASE) 50 MCG/ACT nasal spray USE 2 SPRAYS EACH NOSTRIL EVERY DAY  16 g  5  . gabapentin (NEURONTIN) 400 MG capsule Take 1 capsule (400 mg total) by mouth 4 (four) times daily.  360 capsule  1  . pravastatin (PRAVACHOL) 40 MG tablet TAKE 1 TABLET BY MOUTH AT BEDTIME AS DIRECTED  90 tablet  1  . PRESCRIPTION MEDICATION Allergy shot weekly at Dr. Luan Pulling office      . valsartan-hydrochlorothiazide (DIOVAN-HCT) 80-12.5 MG per tablet TAKE 1 TABLET EVERY DAY  90 tablet  0  . levothyroxine (SYNTHROID, LEVOTHROID) 75 MCG tablet Take 1 tablet (75 mcg total) by mouth daily before breakfast.  30 tablet  6   No current facility-administered medications on file prior to visit.         Objective:   Physical Exam Filed Vitals:   06/01/13 1023  BP: 108/74  Pulse: 64  Temp: 97.6 F (36.4  C)  Height: 5\' 8"  (1.727 m)  Weight: 196 lb (88.905 kg)  Alert and oriented. Skin warm and dry. Oral mucosa is moist.   . Sclera anicteric, conjunctivae is pink. Thyroid not enlarged. No cervical lymphadenopathy. Lungs clear. Heart regular rate and rhythm.  Abdomen is soft. Bowel sounds are positive. No hepatomegaly. No abdominal masses felt. No tenderness.  No edema to lower extremities.       Assessment:    GERD, controlled at this time. He seems to be doing well. He is staying active.    Plan:     Rx for Omeprazole 20mg  Eprescribed to his pharmacy with 3 refills. (90#). OV in 2 yrs.

## 2013-06-04 ENCOUNTER — Other Ambulatory Visit: Payer: Self-pay | Admitting: Family Medicine

## 2013-06-10 ENCOUNTER — Ambulatory Visit (INDEPENDENT_AMBULATORY_CARE_PROVIDER_SITE_OTHER): Payer: Medicare Other | Admitting: Family Medicine

## 2013-06-10 ENCOUNTER — Encounter: Payer: Self-pay | Admitting: Family Medicine

## 2013-06-10 VITALS — BP 118/78 | Temp 99.0°F | Ht 68.0 in | Wt 199.2 lb

## 2013-06-10 DIAGNOSIS — J019 Acute sinusitis, unspecified: Secondary | ICD-10-CM | POA: Diagnosis not present

## 2013-06-10 MED ORDER — ALBUTEROL SULFATE HFA 108 (90 BASE) MCG/ACT IN AERS: 2.0000 | INHALATION_SPRAY | RESPIRATORY_TRACT | Status: DC | PRN

## 2013-06-10 MED ORDER — GABAPENTIN 400 MG PO CAPS: 400.0000 mg | ORAL_CAPSULE | Freq: Four times a day (QID) | ORAL | Status: DC

## 2013-06-10 MED ORDER — AZITHROMYCIN 250 MG PO TABS: ORAL_TABLET | ORAL | Status: DC

## 2013-06-10 NOTE — Progress Notes (Signed)
   Subjective:    Patient ID: Thomas [name], male    DOB: 05-07-1934, 79 y.o.   MRN: [id number]  HPI Patient arrives with nasal congestion since Sunday. He relates head congestion drainage coughing symptoms her past several days worse over the past 24 hours no high fever chills or sweats  Review of Systems  Constitutional: Negative for fever and activity change.  HENT: Positive for congestion and rhinorrhea. Negative for ear pain.   Eyes: Negative for discharge.  Respiratory: Positive for cough. Negative for wheezing.   Cardiovascular: Negative for chest pain.       Objective:   Physical Exam  Nursing note and vitals reviewed. Constitutional: He appears well-developed.  HENT:  Head: Normocephalic.  Mouth/Throat: Oropharynx is clear and moist. No oropharyngeal exudate.  Neck: Normal range of motion.  Cardiovascular: Normal rate, regular rhythm and normal heart sounds.   No murmur heard. Pulmonary/Chest: Effort normal and breath sounds normal. He has no wheezes.  Lymphadenopathy:    He has no cervical adenopathy.  Neurological: He exhibits normal muscle tone.  Skin: Skin is warm and dry.    Upper respiratory illness/sinusitis-antibiotics prescribed if high fevers wheezing difficulty breathing or worse followup      Assessment & Plan:

## 2013-06-16 DIAGNOSIS — J3089 Other allergic rhinitis: Secondary | ICD-10-CM | POA: Diagnosis not present

## 2013-06-30 ENCOUNTER — Telehealth: Payer: Self-pay | Admitting: Family Medicine

## 2013-06-30 DIAGNOSIS — J3089 Other allergic rhinitis: Secondary | ICD-10-CM | POA: Diagnosis not present

## 2013-06-30 NOTE — Telephone Encounter (Signed)
To [facility]

## 2013-06-30 NOTE — Telephone Encounter (Signed)
Pt dropped a note to the office requesting a script for  Triamcinolone .1% cream

## 2013-07-01 ENCOUNTER — Other Ambulatory Visit: Payer: Self-pay | Admitting: *Deleted

## 2013-07-01 MED ORDER — TRIAMCINOLONE ACETONIDE 0.1 % EX CREA: 1.0000 "application " | TOPICAL_CREAM | Freq: Two times a day (BID) | CUTANEOUS | Status: DC | PRN

## 2013-07-01 NOTE — Telephone Encounter (Signed)
Med sent to pharm. Pt notified on voicemail.  

## 2013-07-01 NOTE — Telephone Encounter (Signed)
May send in a prescription for this Kenalog cream 0.1% 60 g apply twice a day when necessary 3 refills

## 2013-07-03 ENCOUNTER — Telehealth: Payer: Self-pay | Admitting: Family Medicine

## 2013-07-03 MED ORDER — CEPHALEXIN 500 MG PO CAPS
500.0000 mg | ORAL_CAPSULE | Freq: Three times a day (TID) | ORAL | Status: DC
Start: 2013-07-03 — End: 2013-08-03

## 2013-07-03 NOTE — Telephone Encounter (Signed)
Pt seen 06/10/13, sinus symptoms have returned, can we call in antibiotic?  [facility]  Please call pt when done [phone]

## 2013-07-03 NOTE — Telephone Encounter (Signed)
Keflex 500 tid ten d 

## 2013-07-03 NOTE — Telephone Encounter (Signed)
Rx sent electronically to pharmacy. Patient notified. 

## 2013-07-07 DIAGNOSIS — J3089 Other allergic rhinitis: Secondary | ICD-10-CM | POA: Diagnosis not present

## 2013-07-14 DIAGNOSIS — J3089 Other allergic rhinitis: Secondary | ICD-10-CM | POA: Diagnosis not present

## 2013-07-21 DIAGNOSIS — J3089 Other allergic rhinitis: Secondary | ICD-10-CM | POA: Diagnosis not present

## 2013-07-28 DIAGNOSIS — J3089 Other allergic rhinitis: Secondary | ICD-10-CM | POA: Diagnosis not present

## 2013-08-03 ENCOUNTER — Encounter: Payer: Self-pay | Admitting: Family Medicine

## 2013-08-03 ENCOUNTER — Ambulatory Visit (INDEPENDENT_AMBULATORY_CARE_PROVIDER_SITE_OTHER): Payer: Medicare Other | Admitting: Family Medicine

## 2013-08-03 VITALS — BP 136/72 | Ht 68.0 in | Wt 196.0 lb

## 2013-08-03 DIAGNOSIS — I10 Essential (primary) hypertension: Secondary | ICD-10-CM | POA: Diagnosis not present

## 2013-08-03 DIAGNOSIS — E785 Hyperlipidemia, unspecified: Secondary | ICD-10-CM | POA: Diagnosis not present

## 2013-08-03 DIAGNOSIS — Z125 Encounter for screening for malignant neoplasm of prostate: Secondary | ICD-10-CM

## 2013-08-03 DIAGNOSIS — R918 Other nonspecific abnormal finding of lung field: Secondary | ICD-10-CM

## 2013-08-03 DIAGNOSIS — E039 Hypothyroidism, unspecified: Secondary | ICD-10-CM | POA: Diagnosis not present

## 2013-08-03 DIAGNOSIS — Z79899 Other long term (current) drug therapy: Secondary | ICD-10-CM

## 2013-08-03 MED ORDER — KETOCONAZOLE 2 % EX CREA: 1.0000 "application " | TOPICAL_CREAM | Freq: Two times a day (BID) | CUTANEOUS | Status: DC | PRN

## 2013-08-03 MED ORDER — FLUCONAZOLE 200 MG PO TABS: 200.0000 mg | ORAL_TABLET | Freq: Every day | ORAL | Status: AC

## 2013-08-03 NOTE — Progress Notes (Signed)
   Subjective:    Patient ID: Thomas [name], male    DOB: [birth year], 79 y.o.   MRN: [id number]  HPI  Patient arrives for a follow up on cholesterol and thyroid.  Patient also would like to have a rash on his left leg and groin checked.  Patient had a CT scan last January which showed a small pulmonary nodule also sent showed dilated aorta saw specialist at [facility] who told him that this is more than likely just a normal variant they did not recommend any followup. I told the patient I feel he ought to do a followup CT of the chest make sure the pulmonary nodule has not changed Review of Systems Denies chest pain relates her itching and the rash denies fevers    Objective:   Physical Exam Lungs clear hearts regular excoriated rash noted in the groin consistent with tinea       Assessment & Plan:  #1 excoriated rash probably tinea daily, Lozol, Diflucan 1 daily for the next 7 days, keep dry as possible, followup if ongoing troubles.  #2 pulmonary nodule needs followup CT scan will set this up then patient followup to discuss further  #3 chronic health issues labs were ordered we will discuss these on his followup

## 2013-08-10 ENCOUNTER — Ambulatory Visit (HOSPITAL_COMMUNITY)
Admission: RE | Admit: 2013-08-10 | Discharge: 2013-08-10 | Disposition: A | Discharge status: Home / Self Care | Payer: Medicare Other | Source: Ambulatory Visit | Attending: Family Medicine | Admitting: Family Medicine

## 2013-08-10 DIAGNOSIS — R911 Solitary pulmonary nodule: Secondary | ICD-10-CM | POA: Insufficient documentation

## 2013-08-10 DIAGNOSIS — I77810 Thoracic aortic ectasia: Secondary | ICD-10-CM | POA: Diagnosis not present

## 2013-08-10 DIAGNOSIS — J984 Other disorders of lung: Secondary | ICD-10-CM | POA: Diagnosis not present

## 2013-08-10 NOTE — Progress Notes (Signed)
Patient notified and verbalized understanding of the test results. No further questions. 

## 2013-08-11 DIAGNOSIS — E039 Hypothyroidism, unspecified: Secondary | ICD-10-CM | POA: Diagnosis not present

## 2013-08-11 DIAGNOSIS — I10 Essential (primary) hypertension: Secondary | ICD-10-CM | POA: Diagnosis not present

## 2013-08-11 DIAGNOSIS — E785 Hyperlipidemia, unspecified: Secondary | ICD-10-CM | POA: Diagnosis not present

## 2013-08-11 DIAGNOSIS — Z79899 Other long term (current) drug therapy: Secondary | ICD-10-CM | POA: Diagnosis not present

## 2013-08-11 DIAGNOSIS — Z125 Encounter for screening for malignant neoplasm of prostate: Secondary | ICD-10-CM | POA: Diagnosis not present

## 2013-08-11 LAB — CBC WITH DIFFERENTIAL/PLATELET
BASOS PCT: 1 % (ref 0–1)
Basophils Absolute: 0.1 10*3/uL (ref 0.0–0.1)
EOS ABS: 0.2 10*3/uL (ref 0.0–0.7)
Eosinophils Relative: 4 % (ref 0–5)
HEMATOCRIT: 51 % (ref 39.0–52.0)
HEMOGLOBIN: 18.2 g/dL — AB (ref 13.0–17.0)
Lymphocytes Relative: 30 % (ref 12–46)
Lymphs Abs: 1.6 10*3/uL (ref 0.7–4.0)
MCH: 32.9 pg (ref 26.0–34.0)
MCHC: 35.7 g/dL (ref 30.0–36.0)
MCV: 92.2 fL (ref 78.0–100.0)
MONO ABS: 0.4 10*3/uL (ref 0.1–1.0)
MONOS PCT: 8 % (ref 3–12)
Neutro Abs: 3 10*3/uL (ref 1.7–7.7)
Neutrophils Relative %: 57 % (ref 43–77)
Platelets: 168 10*3/uL (ref 150–400)
RBC: 5.53 MIL/uL (ref 4.22–5.81)
RDW: 13.8 % (ref 11.5–15.5)
WBC: 5.3 10*3/uL (ref 4.0–10.5)

## 2013-08-11 LAB — LIPID PANEL
CHOLESTEROL: 161 mg/dL (ref 0–200)
HDL: 38 mg/dL — ABNORMAL LOW (ref >39)
LDL Cholesterol: 77 mg/dL (ref 0–99)
TRIGLYCERIDES: 231 mg/dL — AB (ref <150)
Total CHOL/HDL Ratio: 4.2 Ratio
VLDL: 46 mg/dL — ABNORMAL HIGH (ref 0–40)

## 2013-08-11 LAB — T4, FREE: Free T4: 1.01 ng/dL (ref 0.80–1.80)

## 2013-08-11 LAB — TSH: TSH: 12.559 u[IU]/mL — AB (ref 0.350–4.500)

## 2013-08-11 LAB — HEPATIC FUNCTION PANEL
ALBUMIN: 4.5 g/dL (ref 3.5–5.2)
ALK PHOS: 86 U/L (ref 39–117)
ALT: 37 U/L (ref 0–53)
AST: 32 U/L (ref 0–37)
BILIRUBIN INDIRECT: 0.9 mg/dL (ref 0.2–1.2)
Bilirubin, Direct: 0.2 mg/dL (ref 0.0–0.3)
Total Bilirubin: 1.1 mg/dL (ref 0.2–1.2)
Total Protein: 6.8 g/dL (ref 6.0–8.3)

## 2013-08-11 LAB — BASIC METABOLIC PANEL
BUN: 12 mg/dL (ref 6–23)
CALCIUM: 9.4 mg/dL (ref 8.4–10.5)
CO2: 29 mEq/L (ref 19–32)
Chloride: 103 mEq/L (ref 96–112)
Creat: 1.16 mg/dL (ref 0.50–1.35)
Glucose, Bld: 94 mg/dL (ref 70–99)
Potassium: 4.1 mEq/L (ref 3.5–5.3)
SODIUM: 140 meq/L (ref 135–145)

## 2013-08-12 LAB — PSA, MEDICARE: PSA: 1.25 ng/mL (ref <=4.00)

## 2013-08-12 MED ORDER — LEVOTHYROXINE SODIUM 100 MCG PO TABS: 100.0000 ug | ORAL_TABLET | Freq: Every day | ORAL | Status: DC

## 2013-08-12 NOTE — Addendum Note (Signed)
Addended byCharolotte Capuchin D on: 08/12/2013 01:39 PM   Modules accepted: Orders

## 2013-08-13 ENCOUNTER — Other Ambulatory Visit: Payer: Self-pay | Admitting: Family Medicine

## 2013-08-13 DIAGNOSIS — D582 Other hemoglobinopathies: Secondary | ICD-10-CM

## 2013-08-20 DIAGNOSIS — J3089 Other allergic rhinitis: Secondary | ICD-10-CM | POA: Diagnosis not present

## 2013-08-23 ENCOUNTER — Other Ambulatory Visit: Payer: Self-pay | Admitting: Family Medicine

## 2013-08-25 DIAGNOSIS — J3089 Other allergic rhinitis: Secondary | ICD-10-CM | POA: Diagnosis not present

## 2013-08-27 ENCOUNTER — Telehealth (HOSPITAL_COMMUNITY): Payer: Self-pay | Admitting: Hematology and Oncology

## 2013-08-27 ENCOUNTER — Other Ambulatory Visit: Payer: Self-pay | Admitting: Family Medicine

## 2013-09-03 DIAGNOSIS — J3089 Other allergic rhinitis: Secondary | ICD-10-CM | POA: Diagnosis not present

## 2013-09-10 DIAGNOSIS — J3089 Other allergic rhinitis: Secondary | ICD-10-CM | POA: Diagnosis not present

## 2013-09-11 ENCOUNTER — Encounter (HOSPITAL_COMMUNITY): Payer: Self-pay

## 2013-09-11 ENCOUNTER — Encounter (HOSPITAL_COMMUNITY): Payer: Medicare Other | Attending: Hematology and Oncology

## 2013-09-11 VITALS — BP 109/71 | HR 62 | Temp 97.9°F | Resp 18 | Wt 193.9 lb

## 2013-09-11 DIAGNOSIS — D751 Secondary polycythemia: Secondary | ICD-10-CM

## 2013-09-11 DIAGNOSIS — R7989 Other specified abnormal findings of blood chemistry: Secondary | ICD-10-CM | POA: Diagnosis not present

## 2013-09-11 DIAGNOSIS — I10 Essential (primary) hypertension: Secondary | ICD-10-CM | POA: Diagnosis not present

## 2013-09-11 DIAGNOSIS — D45 Polycythemia vera: Secondary | ICD-10-CM

## 2013-09-11 DIAGNOSIS — E069 Thyroiditis, unspecified: Secondary | ICD-10-CM | POA: Diagnosis not present

## 2013-09-11 DIAGNOSIS — J309 Allergic rhinitis, unspecified: Secondary | ICD-10-CM | POA: Diagnosis not present

## 2013-09-11 DIAGNOSIS — E039 Hypothyroidism, unspecified: Secondary | ICD-10-CM

## 2013-09-11 DIAGNOSIS — J45909 Unspecified asthma, uncomplicated: Secondary | ICD-10-CM

## 2013-09-11 DIAGNOSIS — I2510 Atherosclerotic heart disease of native coronary artery without angina pectoris: Secondary | ICD-10-CM | POA: Diagnosis not present

## 2013-09-11 DIAGNOSIS — K219 Gastro-esophageal reflux disease without esophagitis: Secondary | ICD-10-CM

## 2013-09-11 HISTORY — DX: Secondary polycythemia: D75.1

## 2013-09-11 LAB — CBC WITH DIFFERENTIAL/PLATELET
BASOS ABS: 0 10*3/uL (ref 0.0–0.1)
Basophils Relative: 1 % (ref 0–1)
EOS PCT: 3 % (ref 0–5)
Eosinophils Absolute: 0.2 10*3/uL (ref 0.0–0.7)
HCT: 48.8 % (ref 39.0–52.0)
Hemoglobin: 17 g/dL (ref 13.0–17.0)
LYMPHS PCT: 25 % (ref 12–46)
Lymphs Abs: 1.4 10*3/uL (ref 0.7–4.0)
MCH: 32.6 pg (ref 26.0–34.0)
MCHC: 34.8 g/dL (ref 30.0–36.0)
MCV: 93.7 fL (ref 78.0–100.0)
Monocytes Absolute: 0.4 10*3/uL (ref 0.1–1.0)
Monocytes Relative: 7 % (ref 3–12)
NEUTROS ABS: 3.5 10*3/uL (ref 1.7–7.7)
NEUTROS PCT: 64 % (ref 43–77)
PLATELETS: 166 10*3/uL (ref 150–400)
RBC: 5.21 MIL/uL (ref 4.22–5.81)
RDW: 13.3 % (ref 11.5–15.5)
WBC: 5.5 10*3/uL (ref 4.0–10.5)

## 2013-09-11 LAB — COMPREHENSIVE METABOLIC PANEL WITH GFR
ALT: 40 U/L (ref 0–53)
AST: 37 U/L (ref 0–37)
Albumin: 4.1 g/dL (ref 3.5–5.2)
Alkaline Phosphatase: 87 U/L (ref 39–117)
BUN: 15 mg/dL (ref 6–23)
CO2: 28 meq/L (ref 19–32)
Calcium: 9.4 mg/dL (ref 8.4–10.5)
Chloride: 99 meq/L (ref 96–112)
Creatinine, Ser: 1.11 mg/dL (ref 0.50–1.35)
GFR calc Af Amer: 73 mL/min — ABNORMAL LOW
GFR calc non Af Amer: 63 mL/min — ABNORMAL LOW
Glucose, Bld: 87 mg/dL (ref 70–99)
Potassium: 3.7 meq/L (ref 3.7–5.3)
Sodium: 138 meq/L (ref 137–147)
Total Bilirubin: 1.1 mg/dL (ref 0.3–1.2)
Total Protein: 7.3 g/dL (ref 6.0–8.3)

## 2013-09-11 NOTE — Patient Instructions (Signed)
[facility] Discharge Instructions  RECOMMENDATIONS MADE BY THE CONSULTANT AND ANY TEST RESULTS WILL BE SENT TO YOUR REFERRING PHYSICIAN.  EXAM FINDINGS BY THE PHYSICIAN TODAY AND SIGNS OR SYMPTOMS TO REPORT TO CLINIC OR PRIMARY PHYSICIAN: Exam and findings as discussed by Dr. Barnet Glasgow.  Will check some labs today and if there are any issues that require immediate attention we will contact you.    MEDICATIONS PRESCRIBED:  none  INSTRUCTIONS/FOLLOW-UP: Follow-up in 2 weeks with labs and office visit.  Thank you for choosing [facility] to provide your oncology and hematology care.  To afford each patient quality time with our providers, please arrive at least 15 minutes before your scheduled appointment time.  With your help, our goal is to use those 15 minutes to complete the necessary work-up to ensure our physicians have the information they need to help with your evaluation and healthcare recommendations.    Effective January 1st, 2014, we ask that you re-schedule your appointment with our physicians should you arrive 10 or more minutes late for your appointment.  We strive to give you quality time with our providers, and arriving late affects you and other patients whose appointments are after yours.    Again, thank you for choosing [facility].  Our hope is that these requests will decrease the amount of time that you wait before being seen by our physicians.       _____________________________________________________________  Should you have questions after your visit to [facility], please contact our office at [phone] between the hours of 8:30 a.m. and 5:00 p.m.  Voicemails left after 4:30 p.m. will not be returned until the following business day.  For prescription refill requests, have your pharmacy contact our office with your prescription refill request.     Polycythemia Vera  Polycythemia Thomas [name] is a condition in  which the body makes too many red blood cells and there is no known cause. The red blood cells (erythrocytes) are the cells which carry the oxygen in your blood stream to the cells of your body. Because of the increased red blood cells, the blood becomes thicker and does not circulate as well. It would be similar to your car having oil which is too thick so it cannot start and circulate as well. When the blood is too thick it often causes headaches and dizziness. It may also cause blood clots. Even though the blood clots easier, these patients bleed easier. The bleeding is caused because the blood cells which help stop bleeding (platelets) do not function normally. It occurs in all age groups but is more common in the 2 to 30 year age range. TREATMENT  The treatment of polycythemia vera for many years has been blood removal (phlebotomy) which is similar to blood removal in a blood bank, however this blood is not used for donation. Hydroxyurea is used to supplement phlebotomy. Aspirin is commonly given to thin the blood as long as the patient does not have a problem with bleeding. Other drugs are used based on the progression of the disease. Document Released: 12/26/2000 Document Revised: 06/25/2011 Document Reviewed: 07/02/2008 [facility] Patient Information 2014 [place], [place].

## 2013-09-11 NOTE — Progress Notes (Signed)
Thomas [name]'s reason for visit today are for labs as scheduled per MD orders.  Venipuncture performed with a 21 gauge butterfly needle to L Antecubital.  Thomas [name] tolerated venipuncture well and without incident; questions were answered and patient was discharged.

## 2013-09-11 NOTE — Progress Notes (Signed)
Baring A. Barnet Glasgow, M.D.  NEW PATIENT EVALUATION   Name: Thomas [name] Date: 09/11/2013 MRN: [id number] DOB: [birth year]  PCP: Sallee Lange, MD   REFERRING PHYSICIAN: [name], MD  REASON FOR REFERRAL: Elevated hemoglobin     HISTORY OF PRESENT ILLNESS:Thomas [name] is a 79 y.o. male who is referred by his family physician for elevated hemoglobin. He has never been told previously that his hemoglobin was elevated. He does have a hearing deficit. He also suffers from allergic rhinitis and asthma and has never smoked. He denies any PND, orthopnea, palpitations, but does get short of breath on exertion. He denies lower extremity swelling or redness, chest pain, expectoration, hemoptysis, epistaxis, melena, hematochezia, easy satiety, abdominal pain, pruritus, skin rash, headache, or seizures. 2   PAST MEDICAL HISTORY:  has a past medical history of Asthma; Pollen allergies; Kidney stones; Hypertension; Sleep apnea; Hernia; History of stress test (2011); Hypothyroidism; GERD (gastroesophageal reflux disease); Renal calculus; H/O hiatal hernia; and Arthritis.     PAST SURGICAL HISTORY: Past Surgical History  Procedure Laterality Date   Back surgery      L-4 , L-5  patient states that it was years ago.   Appendectomy      50 years ago   Colonoscopy      at age 81   Upper gastrointestinal endoscopy      egd/ed   Eye surgery      cataracts removed- bilateral- /w IOL   Thyroidectomy Right 09/17/2012    Procedure: RIGHT HEMI-THYROIDECTOMY;  Surgeon: Ascencion Dike, MD;  Location: [facility];  Service: ENT;  Laterality: Right;     CURRENT MEDICATIONS: has a current medication list which includes the following prescription(s): fluticasone, gabapentin, ketoconazole, levothyroxine, omeprazole, omeprazole, pravastatin, PRESCRIPTION MEDICATION, triamcinolone cream, valsartan-hydrochlorothiazide, and  budesonide-formoterol.   ALLERGIES: Penicillins   SOCIAL HISTORY:  reports that he has never smoked. His smokeless tobacco use includes Chew. He reports that he drinks alcohol. He reports that he does not use illicit drugs.   FAMILY HISTORY: family history includes Healthy in his son; Heart disease in his brother, father, and mother.    REVIEW OF SYSTEMS:  Other than that discussed above is noncontributory.    PHYSICAL EXAM:  weight is 193 lb 14.4 oz (87.952 kg). His oral temperature is 97.9 F (36.6 C). His blood pressure is 109/71 and his pulse is 62. His respiration is 18 and oxygen saturation is 95%.    GENERAL:alert, no distress and comfortable SKIN: skin color, texture, turgor are normal, no rashes or significant lesions EYES: normal, Conjunctiva are pink and non-injected, sclera clear OROPHARYNX:no exudate, no erythema and lips, buccal mucosa, and tongue normal  NECK: supple,, non-tender,  thyroid surgical scar is well-healed, status post right thyroid lobectomy. CHEST: Normal AP diameter with no breast masses. LYMPH:  no palpable lymphadenopathy in the cervical, axillary or inguinal LUNGS: clear to auscultation and percussion with normal breathing effort. No wheezes or rhonchi. HEART: regular rate & rhythm and no murmurs ABDOMEN:abdomen soft, non-tender and normal bowel sounds. Soft with no hepatosplenomegaly, ascites, or CVA tenderness. MUSCULOSKELETALl:no cyanosis of digits, no clubbing or edema  NEURO: alert & oriented x 3 with fluent speech, no focal motor/sensory deficits. Hearing deficit wearing a hearing aid.    LABORATORY DATA:  No visits with results within 30 Day(s) from this visit. Latest known visit with results is:  Office Visit on 08/03/2013  Component Date  Value Ref Range Status   Cholesterol 08/11/2013 161  0 - 200 mg/dL Final   Comment: ATP III Classification:                                < 200        mg/dL        Desirable                                200 - 239     mg/dL        Borderline High                               >= 240        mg/dL        High                              Triglycerides 08/11/2013 231* <150 mg/dL Final   HDL 08/11/2013 38* >39 mg/dL Final   Total CHOL/HDL Ratio 08/11/2013 4.2   Final   VLDL 08/11/2013 46* 0 - 40 mg/dL Final   LDL Cholesterol 08/11/2013 77  0 - 99 mg/dL Final   Comment:                            Total Cholesterol/HDL Ratio:CHD Risk                                                 Coronary Heart Disease Risk Table                                                                 Men       Women                                   1/2 Average Risk              3.4        3.3                                       Average Risk              5.0        4.4                                    2X Average Risk              9.6        7.1  3X Average Risk             23.4       11.0                          Use the calculated Patient Ratio above and the CHD Risk table                           to determine the patient's CHD Risk.                          ATP III Classification (LDL):                                < 100        mg/dL         Optimal                               100 - 129     mg/dL         Near or Above Optimal                               130 - 159     mg/dL         Borderline High                               160 - 189     mg/dL         High                                > 190        mg/dL         Very High                              WBC 08/11/2013 5.3  4.0 - 10.5 K/uL Final   RBC 08/11/2013 5.53  4.22 - 5.81 MIL/uL Final   Hemoglobin 08/11/2013 18.2* 13.0 - 17.0 g/dL Final   HCT 08/11/2013 51.0  39.0 - 52.0 % Final   MCV 08/11/2013 92.2  78.0 - 100.0 fL Final   MCH 08/11/2013 32.9  26.0 - 34.0 pg Final   MCHC 08/11/2013 35.7  30.0 - 36.0 g/dL Final   RDW 08/11/2013 13.8  11.5 - 15.5 % Final   Platelets 08/11/2013 168  150 -  400 K/uL Final   Neutrophils Relative % 08/11/2013 57  43 - 77 % Final   Neutro Abs 08/11/2013 3.0  1.7 - 7.7 K/uL Final   Lymphocytes Relative 08/11/2013 30  12 - 46 % Final   Lymphs Abs 08/11/2013 1.6  0.7 - 4.0 K/uL Final   Monocytes Relative 08/11/2013 8  3 - 12 % Final   Monocytes Absolute 08/11/2013 0.4  0.1 - 1.0 K/uL Final   Eosinophils Relative 08/11/2013 4  0 - 5 % Final   Eosinophils Absolute 08/11/2013 0.2  0.0 - 0.7 K/uL Final   Basophils Relative 08/11/2013 1  0 - 1 %  Final   Basophils Absolute 08/11/2013 0.1  0.0 - 0.1 K/uL Final   Smear Review 08/11/2013 Criteria for review not met   Final   Free T4 08/11/2013 1.01  0.80 - 1.80 ng/dL Final   TSH 08/11/2013 12.559* 0.350 - 4.500 uIU/mL Final   Sodium 08/11/2013 140  135 - 145 mEq/L Final   Potassium 08/11/2013 4.1  3.5 - 5.3 mEq/L Final   Chloride 08/11/2013 103  96 - 112 mEq/L Final   CO2 08/11/2013 29  19 - 32 mEq/L Final   Glucose, Bld 08/11/2013 94  70 - 99 mg/dL Final   BUN 08/11/2013 12  6 - 23 mg/dL Final   Creat 08/11/2013 1.16  0.50 - 1.35 mg/dL Final   Calcium 08/11/2013 9.4  8.4 - 10.5 mg/dL Final   Total Bilirubin 08/11/2013 1.1  0.2 - 1.2 mg/dL Final   Bilirubin, Direct 08/11/2013 0.2  0.0 - 0.3 mg/dL Final   Indirect Bilirubin 08/11/2013 0.9  0.2 - 1.2 mg/dL Final   Alkaline Phosphatase 08/11/2013 86  39 - 117 U/L Final   AST 08/11/2013 32  0 - 37 U/L Final   ALT 08/11/2013 37  0 - 53 U/L Final   Total Protein 08/11/2013 6.8  6.0 - 8.3 g/dL Final   Albumin 08/11/2013 4.5  3.5 - 5.2 g/dL Final   PSA 08/11/2013 1.25  <=4.00 ng/mL Final   Comment: Test Methodology: ECLIA PSA (Electrochemiluminescence Immunoassay)                                                     For PSA values from 2.5-4.0, particularly in younger men <60 years                          old, the AUA and NCCN suggest testing for % Free PSA (3515) and                          evaluation of the rate of increase  in PSA (PSA velocity).    Urinalysis No results found for this basename: colorurine,  appearanceur,  labspec,  phurine,  glucoseu,  hgbur,  bilirubinur,  ketonesur,  proteinur,  urobilinogen,  nitrite,  leukocytesur      '@RADIOGRAPHY' : CT Chest Wo Contrast Status: Final result         PACS Images    Show images for CT Chest Wo Contrast         Study Result    CLINICAL DATA: Pulmonary nodule; thoracic aortic dilatation  EXAM:  CT CHEST WITHOUT CONTRAST  TECHNIQUE:  Multidetector CT imaging of the chest was performed following the  standard protocol without IV contrast.  COMPARISON: Chest CT April 29, 2012; chest radiograph September 15, 2012  FINDINGS:  There is a stable 3 mm nodular opacity in the anterior segment of  the right upper lobe, best seen on axial slice 29 series 3 and  sagittal slice 38 series 5. No other parenchymal lung lesion is  identified. There is mild scarring in both lung bases as well as in  the anterior segment right upper lobe medially, stable.  There is no appreciable thoracic adenopathy. Subcentimeter  mediastinal lymph nodes do not meet size criteria for pathologic  significance.  Pericardium is not  thickened. There are scattered foci of coronary  artery calcification. The ascending thoracic aorta has a maximum  transverse diameter of 3.9 x 4.2 cm, stable. Aorta appears unchanged  compared to prior study.  Visualized upper abdominal structures appear unremarkable except for  mild atherosclerotic change in the aorta. There are no blastic or  lytic bone lesions. Since the prior study, the patient has undergone  on removal of the right lobe of the thyroid. Remaining thyroid  appears unremarkable on this noncontrast enhanced study.  IMPRESSION:  Stable 3 mm nodular opacity in the anterior segment right upper  lobe. Unless patient has history of or suspicion for underlying  malignancy, further assessment is not felt to be necessary from an   imaging standpoint.  Status post partial thyroidectomy. Remaining thyroid appears  unremarkable on this noncontrast enhanced study.  No adenopathy.  Stable prominence of the ascending thoracic aorta without change  from prior CT examination.  Electronically Signed  By: Lowella Grip M.D.  On: 08/10/2013      PATHOLOGY:  for AMAL, SAIKI ([id number]) Patient: ZEVEN, KOCAK Collected: 09/17/2012 Client: [facility] Accession: [id number] Received: 09/17/2012 Raylene Miyamoto, MD DOB: [birth year] Age: 21 Gender: M Reported: 09/18/2012 [address] Patient Ph: [phone] MRN #: [id number] [place], [place] [address] Visit #: [id number] Chart #: Phone:  Fax: CC: REPORT OF SURGICAL PATHOLOGY FINAL DIAGNOSIS Diagnosis Thyroid, lobectomy, Right - BENIGN THYROID PARENCHYMA WITH EXTENSIVE LYMPHOCYTIC THYROIDITIS. - BENIGN HYPERPLASTIC THYROID NODULE, 2 CM. - BENIGN HYALINIZED NODULE CONTAINING DYSTROPHIC CALCIFICATION, 0.6 CM. - NO EVIDENCE OF MALIGNANCY. Willeen Niece MD Pathologist, Electronic Signature (Case signed 09/18/2012) Intraoperative Diagnosis RIGHT THYROID LOBE, FROZEN SECTION DIAGNOSIS: LYMPHOCYTIC THYROIDITIS. DEFER TO PERMANENT FOR CYTOLOGIC ASSESSMENT FOR PAPILLARY THYROID CARCINOMA. (RH) Specimen Gross and Clinical Information Specimen(s) Obtained: Thyroid, lobectomy, Right Specimen Clinical Information Thyroid mass (tl) Gross Received fresh for rapid intraoperative consult is a 10 gram, 4.7 x 2.5 x 1.8 cm portion of thyroid, clinically right lobe. The margins are inked prior to sectioning. At the lower pole there is a 2 cm ill-defined friable hemorrhagic nodule. The surrounding parenchyma is vaguely nodular, rubbery red-brown. There is a 0.6 cm calcified tan-yellow nodule. A section is submitted for frozen section and touch preparations are submitted for rapid intraoperative consult. Additional sections are submitted in 6 cassettes: A =  section of lower pole nodule submitted for frozen section. B - isthmus margin. C - E = remainder of lower pole nodule. F = uninvolved parenchyma. G = decalcified sections of calcified nodule. (GRP:ecj 09/17/2012) 1 of 2 FINAL for EUFEMIO, STRAHM [phone]) Report signed out from the following location(s) [address],[place] [address].[id number]., [address] [place], [place], [place] [address]. CLIA #: [id number],  IMPRESSION:  #1. Probable myeloproliferative disorder manifesting as polycythemia. #2. Allergic rhinitis with asthma. #3. Hypertension, controlled. #4. Gastroesophageal reflux disease. #5. Thyroiditis, status post right thyroid lobectomy #6. Coronary artery disease without evidence of dysrhythmia or heart failure.   PLAN:  #1. A discussion was held regarding the difference between primary and secondary polycythemia. Additional lab tests were done today to help discriminate between these possibilities. #2. The patient was told to continue his current medications. #3. Followup in 2 weeks with CBC.  I appreciate the opportunity of sharing in his care.   Farrel Gobble, MD 09/11/2013 3:08 PM   DISCLAIMER:  This note was dictated with voice recognition softwre.  Similar sounding words can inadvertently be transcribed  inaccurately and may not be corrected upon review.

## 2013-09-14 LAB — ERYTHROPOIETIN: Erythropoietin: 9.4 m[IU]/mL (ref 2.6–18.5)

## 2013-09-15 LAB — P210 BCR-ABL 1: P210 BCR ABL1: NOT DETECTED

## 2013-09-15 LAB — BCR/ABL GENE REARRANGEMENT QNT, PCR
BCR ABL1 / ABL1 IS: 0 %
BCR ABL1/ABL1: 0 %

## 2013-09-15 LAB — P190 BCR-ABL 1: P190 BCR ABL1: NOT DETECTED

## 2013-09-17 DIAGNOSIS — J3089 Other allergic rhinitis: Secondary | ICD-10-CM | POA: Diagnosis not present

## 2013-09-22 DIAGNOSIS — J3089 Other allergic rhinitis: Secondary | ICD-10-CM | POA: Diagnosis not present

## 2013-09-28 ENCOUNTER — Encounter (HOSPITAL_COMMUNITY): Payer: Self-pay

## 2013-09-28 ENCOUNTER — Encounter (HOSPITAL_COMMUNITY): Payer: Medicare Other | Attending: Hematology and Oncology

## 2013-09-28 ENCOUNTER — Encounter (HOSPITAL_COMMUNITY): Payer: Medicare Other

## 2013-09-28 VITALS — BP 112/66 | HR 68 | Temp 97.8°F | Resp 16 | Wt 194.9 lb

## 2013-09-28 DIAGNOSIS — D45 Polycythemia vera: Secondary | ICD-10-CM | POA: Diagnosis not present

## 2013-09-28 DIAGNOSIS — E039 Hypothyroidism, unspecified: Secondary | ICD-10-CM

## 2013-09-28 DIAGNOSIS — D751 Secondary polycythemia: Secondary | ICD-10-CM

## 2013-09-28 LAB — CBC WITH DIFFERENTIAL/PLATELET
BASOS ABS: 0 10*3/uL (ref 0.0–0.1)
BASOS PCT: 1 % (ref 0–1)
EOS ABS: 0.2 10*3/uL (ref 0.0–0.7)
Eosinophils Relative: 4 % (ref 0–5)
HCT: 49.1 % (ref 39.0–52.0)
Hemoglobin: 17.8 g/dL — ABNORMAL HIGH (ref 13.0–17.0)
Lymphocytes Relative: 24 % (ref 12–46)
Lymphs Abs: 1.5 10*3/uL (ref 0.7–4.0)
MCH: 33.7 pg (ref 26.0–34.0)
MCHC: 36.3 g/dL — AB (ref 30.0–36.0)
MCV: 93 fL (ref 78.0–100.0)
Monocytes Absolute: 0.3 10*3/uL (ref 0.1–1.0)
Monocytes Relative: 5 % (ref 3–12)
NEUTROS PCT: 66 % (ref 43–77)
Neutro Abs: 4.1 10*3/uL (ref 1.7–7.7)
PLATELETS: 146 10*3/uL — AB (ref 150–400)
RBC: 5.28 MIL/uL (ref 4.22–5.81)
RDW: 13 % (ref 11.5–15.5)
WBC: 6.2 10*3/uL (ref 4.0–10.5)

## 2013-09-28 LAB — TSH: TSH: 1.92 u[IU]/mL (ref 0.350–4.500)

## 2013-09-28 NOTE — Progress Notes (Unsigned)
Thomas [name] presented for labwork. Labs per MD order drawn via Peripheral Line 23 gauge needle inserted in Right AC  Good blood return present. Procedure without incident.  Needle removed intact. Patient tolerated procedure well.

## 2013-09-28 NOTE — Progress Notes (Signed)
Thomas [name]  OFFICE PROGRESS NOTE  Sallee Lange, MD [address] [place] [address]  DIAGNOSIS: Unspecified hypothyroidism - Plan: TSH  Polycythemia vera(238.4)  Chief Complaint  Patient presents with  . Polycythemia    CURRENT THERAPY: Completed workup for polycythemia.   INTERVAL HISTORY: KELLYN MANSFIELD 79 y.o. male returns for followup of elevated hemoglobin after undergoing additional testing. He had been found to be hypothyroid in September 2014 when TSH was 38.7. Repeat determination on 08/11/2013 was 12.559 at which time Synthroid dose was changed to 100 mcg daily by his family physician. Repeat value will be done today along with CBC. There appears to be a chronologic association between his elevated hemoglobin and his TSH---higher hemoglobin with higher TSH value except in January of 2015 where there is a discordance. He continues to do well with no specific complaints. He denies any pruritus, cough, shortness of breath, melena, hematochezia, hematuria, peripheral paresthesias, lower extremity swelling or redness, chest pain, cough, wheezing, skin rash, headache, or seizures.  MEDICAL HISTORY: Past Medical History  Diagnosis Date  . Asthma   . Pollen allergies   . Kidney stones   . Hypertension   . Sleep apnea     mild no cpap  . Hernia   . History of stress test 2011  . Hypothyroidism   . GERD (gastroesophageal reflux disease)   . Renal calculus     some removed by Cystoscopy, one passed spontaneously  . H/O hiatal hernia   . Arthritis     "my entire back is gone"    INTERIM HISTORY: has HYPERLIPIDEMIA; HYPERTENSION; CORONARY ATHEROSCLEROSIS NATIVE CORONARY ARTERY; ABNORMAL ELECTROCARDIOGRAM; Chest pain, musculoskeletal; GERD (gastroesophageal reflux disease); Reactive airway disease; Thyroid nodule; Unspecified hypothyroidism; Polycythemia vera(238.4); and Allergic rhinitis on his problem list.     ALLERGIES:  is allergic to penicillins.  MEDICATIONS: has a current medication list which includes the following prescription(s): budesonide-formoterol, fluticasone, gabapentin, ketoconazole, levothyroxine, omeprazole, pravastatin, PRESCRIPTION MEDICATION, triamcinolone cream, and valsartan-hydrochlorothiazide.  SURGICAL HISTORY:  Past Surgical History  Procedure Laterality Date  . Back surgery      L-4 , L-5  patient states that it was years ago.  Marland Kitchen Appendectomy      50 years ago  . Colonoscopy      at age 60  . Upper gastrointestinal endoscopy      egd/ed  . Eye surgery      cataracts removed- bilateral- /w IOL  . Thyroidectomy Right 09/17/2012    Procedure: RIGHT HEMI-THYROIDECTOMY;  Surgeon: Ascencion Dike, MD;  Location: [facility];  Service: ENT;  Laterality: Right;    FAMILY HISTORY: family history includes Healthy in his son; Heart disease in his brother, father, and mother.  SOCIAL HISTORY:  reports that he has never smoked. His smokeless tobacco use includes Chew. He reports that he drinks alcohol. He reports that he does not use illicit drugs.  REVIEW OF SYSTEMS:  Other than that discussed above is noncontributory.  PHYSICAL EXAMINATION: ECOG PERFORMANCE STATUS: 1 - Symptomatic but completely ambulatory  Blood pressure 112/66, pulse 68, temperature 97.8 F (36.6 C), temperature source Oral, resp. rate 16, weight 194 lb 14.4 oz (88.406 kg).  GENERAL:alert, no distress and comfortable SKIN: skin color, texture, turgor are normal, no rashes or significant lesions EYES: PERLA; Conjunctiva are pink and non-injected, sclera clear SINUSES: No redness or tenderness over maxillary or ethmoid sinuses OROPHARYNX:no exudate, no erythema on lips, buccal mucosa,  or tongue. NECK: supple, thyroid normal size, non-tender, without nodularity. No masses CHEST: Normal AP diameter with no breast masses. LYMPH:  no palpable lymphadenopathy in the cervical, axillary or inguinal LUNGS: clear to  auscultation and percussion with normal breathing effort HEART: regular rate & rhythm and no murmurs. ABDOMEN:abdomen soft, non-tender and normal bowel sounds. Soft with no organomegaly, ascites, or CVA tenderness. MUSCULOSKELETAL:no cyanosis of digits and no clubbing. Range of motion normal.  NEURO: alert & oriented x 3 with fluent speech, no focal motor/sensory deficits. Hearing aid in place.   LABORATORY DATA: Results for RAFIQ, BUCKLIN (MRN [id number]) as of 09/28/2013 13:38  Ref. Range 11/12/2012 07:42 12/29/2012 07:46 08/11/2013 07:27 09/11/2013 14:40  Hemoglobin Latest Range: 13.0-17.0 g/dL 17.2 (H) 18.1 (H) 18.2 (H) 17.0   Results for ANTONY, SIAN (MRN [id number]) as of 09/28/2013 13:38  Ref. Range 12/29/2012 07:46 02/04/2013 08:54 05/11/2013 08:07 08/11/2013 07:27  TSH Latest Range: 0.350-4.500 uIU/mL 38.700 (H) 8.684 (H) 1.370 12.559 (H)    Appointment on 09/28/2013  Component Date Value Ref Range Status  . WBC 09/28/2013 6.2  4.0 - 10.5 K/uL Final  . RBC 09/28/2013 5.28  4.22 - 5.81 MIL/uL Final  . Hemoglobin 09/28/2013 17.8* 13.0 - 17.0 g/dL Final  . HCT 09/28/2013 49.1  39.0 - 52.0 % Final  . MCV 09/28/2013 93.0  78.0 - 100.0 fL Final  . MCH 09/28/2013 33.7  26.0 - 34.0 pg Final  . MCHC 09/28/2013 36.3* 30.0 - 36.0 g/dL Final  . RDW 09/28/2013 13.0  11.5 - 15.5 % Final  . Platelets 09/28/2013 146* 150 - 400 K/uL Final  . Neutrophils Relative % 09/28/2013 66  43 - 77 % Final  . Neutro Abs 09/28/2013 4.1  1.7 - 7.7 K/uL Final  . Lymphocytes Relative 09/28/2013 24  12 - 46 % Final  . Lymphs Abs 09/28/2013 1.5  0.7 - 4.0 K/uL Final  . Monocytes Relative 09/28/2013 5  3 - 12 % Final  . Monocytes Absolute 09/28/2013 0.3  0.1 - 1.0 K/uL Final  . Eosinophils Relative 09/28/2013 4  0 - 5 % Final  . Eosinophils Absolute 09/28/2013 0.2  0.0 - 0.7 K/uL Final  . Basophils Relative 09/28/2013 1  0 - 1 % Final  . Basophils Absolute 09/28/2013 0.0  0.0 - 0.1 K/uL Final  Office Visit  on 09/11/2013  Component Date Value Ref Range Status  . WBC 09/11/2013 5.5  4.0 - 10.5 K/uL Final  . RBC 09/11/2013 5.21  4.22 - 5.81 MIL/uL Final  . Hemoglobin 09/11/2013 17.0  13.0 - 17.0 g/dL Final  . HCT 09/11/2013 48.8  39.0 - 52.0 % Final  . MCV 09/11/2013 93.7  78.0 - 100.0 fL Final  . MCH 09/11/2013 32.6  26.0 - 34.0 pg Final  . MCHC 09/11/2013 34.8  30.0 - 36.0 g/dL Final  . RDW 09/11/2013 13.3  11.5 - 15.5 % Final  . Platelets 09/11/2013 166  150 - 400 K/uL Final  . Neutrophils Relative % 09/11/2013 64  43 - 77 % Final  . Neutro Abs 09/11/2013 3.5  1.7 - 7.7 K/uL Final  . Lymphocytes Relative 09/11/2013 25  12 - 46 % Final  . Lymphs Abs 09/11/2013 1.4  0.7 - 4.0 K/uL Final  . Monocytes Relative 09/11/2013 7  3 - 12 % Final  . Monocytes Absolute 09/11/2013 0.4  0.1 - 1.0 K/uL Final  . Eosinophils Relative 09/11/2013 3  0 - 5 % Final  . Eosinophils  Absolute 09/11/2013 0.2  0.0 - 0.7 K/uL Final  . Basophils Relative 09/11/2013 1  0 - 1 % Final  . Basophils Absolute 09/11/2013 0.0  0.0 - 0.1 K/uL Final  . Sodium 09/11/2013 138  137 - 147 mEq/L Final  . Potassium 09/11/2013 3.7  3.7 - 5.3 mEq/L Final  . Chloride 09/11/2013 99  96 - 112 mEq/L Final  . CO2 09/11/2013 28  19 - 32 mEq/L Final  . Glucose, Bld 09/11/2013 87  70 - 99 mg/dL Final  . BUN 09/11/2013 15  6 - 23 mg/dL Final  . Creatinine, Ser 09/11/2013 1.11  0.50 - 1.35 mg/dL Final  . Calcium 09/11/2013 9.4  8.4 - 10.5 mg/dL Final  . Total Protein 09/11/2013 7.3  6.0 - 8.3 g/dL Final  . Albumin 09/11/2013 4.1  3.5 - 5.2 g/dL Final  . AST 09/11/2013 37  0 - 37 U/L Final  . ALT 09/11/2013 40  0 - 53 U/L Final  . Alkaline Phosphatase 09/11/2013 87  39 - 117 U/L Final  . Total Bilirubin 09/11/2013 1.1  0.3 - 1.2 mg/dL Final  . GFR calc non Af Amer 09/11/2013 63* >90 mL/min Final  . GFR calc Af Amer 09/11/2013 73* >90 mL/min Final   Comment: (NOTE)                          The eGFR has been calculated using the CKD EPI  equation.                          This calculation has not been validated in all clinical situations.                          eGFR's persistently <90 mL/min signify possible Chronic Kidney                          Disease.  . Erythropoietin 09/11/2013 9.4  2.6 - 18.5 mIU/mL Final   Performed at [facility]  . Clinical Indication (JAK2) 09/11/2013 PENDING   Incomplete  . Specimen Source (JAK2) 09/11/2013 PENDING   Incomplete  . Block / Specimen ID (JAK2) 09/11/2013 PENDING   Incomplete  . Gene Result (JAK2) 09/11/2013 PENDING   Incomplete  . Interpretation (JAK2) 09/11/2013 PENDING   Incomplete  . JAK2 GenotypR 09/11/2013 Not Detected   Final   Comment: (NOTE)                                   ** Normal Reference Range: Not Detected **                          Clinical Utility:                          The somatic acquired mutation affecting Janus Tyrosine Kinase 2 (JAK2                          V617F) in exon 14 is associated with myeloproliferative disorders                          (MPD).  JAK2 V617F has been  found to be the most common molecular                          abnormality in patients with Polycythemia Vera (PV, >90%) or Essential                          Thombocythemia (ET, 35% - 70%).  The lowest frequency is found in IMF                          patients (chronic Idiopathic Myelofibrosis, 50%).  The presence of the                          JAK2 mutation causes activation of molecular signals that lead to                          proliferation of hematopoietic precursors outside of their normal                          pathways including erythropoietin-independent erythroid colony growth                          in most patients with PV and some patients with ET.  The JAK2 mutation                          is considered the main oncogenic event responsible for PV development                          but its precise role in ET and IMF remains questionable and may                           suggest the requirement of other genetic events to induce these                          pathologies.  The absence of JAK2 V617F does not exclude other                          changes, including in the exon 12.                          Test Methodology:                          Patient DNA is assayed using allele specific PCR technology from                          Qiagen and is tested using the Roche Light Cycler Real Time PCR                          analyzer. This assay is reported as detected when >5% of cells show                          the presence of the JAK2 V617F mutation.  This test was performed using a kit that has not been cleared or                          approved by the FDA.  The analytical performance characteristics of                          this test have been determined by [facility].  This                          test may not be used for diagnostic, prognostic or monitoring purposes                          without confirmation by other medically established means.                          Performed at [facility]  . BCR ABL1 / ABL1 09/11/2013 0.000   Final  . BCR ABL1 / ABL1 IS 09/11/2013 0.000   Final  . Interpretation - BCRQ 09/11/2013 REPORT   Final   Comment: (NOTE)                          The P190 and P210 BCR-ABL1 fusion transcripts are NOT                          detected.                          Reverse transcription real-time PCR is performed for                          the P190 and P210 BCR-ABL1 transcripts associated with                          the t(9;22) chromosomal translocation. For P190,                          results are expressed as a percent ratio of BCR-ABL1                          to the ABL1 transcript, and further adjusted to the                          international scale (IS) for P210.                          Assay sensitivity is dependent on RNA quality and                           sample cellularity but is usually at least 4-logs                          below BCR-ABL1 baseline transcript levels. Reference                          range is 0.000% BCR-ABL1/ABL1.  This test was developed and its performance                          characteristics have been determined by [employer], [place], [place].                          Performance characteristics refer to the                          analytical performance of the test.                                                                               Brett Fairy, M.D., [facility], [place] Director, Molecular Oncology.                          Performed at [facility]  . P190 BCR ABL1 09/11/2013 Not Detected   Final   Performed at [facility]  . P210 BCR ABL1 09/11/2013 Not Detected   Final   Performed at [place]: Peripheral smear failed to reveal evidence of premature forms.  Urinalysis No results found for this basename: colorurine,  appearanceur,  labspec,  phurine,  glucoseu,  hgbur,  bilirubinur,  ketonesur,  proteinur,  urobilinogen,  nitrite,  leukocytesur    RADIOGRAPHIC STUDIES: No results found.  ASSESSMENT:  #1. Probable myeloproliferative disorder but with negative JAK-2 and BCR-ABL in peripheral blood. Hemoglobin is not high enough to warrant phlebotomy in my opinion. I would also did not wish to start the patient on a myelosuppressive drugs such as Hydrea which may unfortunately decreases white blood cell count and platelet count. #2. Hypothyroidism, currently on Synthroid 100 mcg daily, last TSH of 13 done in April of 2015 just prior to a Synthroid dose was increased to 100 mcg daily. Repeat values done today. The usual association of thyroid disease with polycythemia is that hyperthyroidism results in polycythemia, not hypothyroidism. #3.  Allergic rhinitis with asthma, stable. #4. Hypertension, controlled. #5. Thyroiditis, status post right thyroid lobectomy, now frankly hypothyroid, on Synthroid. #6. Coronary artery disease without evidence of dysrhythmia or heart failure.   PLAN:  #1. Continue monitoring CBC. Would not recommend phlebotomy or Hydrea at this time. #2. Continue current medications including a 1 mg of aspirin daily which the patient started about a week ago. #3. Followup in 6 months with CBC and TSH.   All questions were answered. The patient knows to call the clinic with any problems, questions or concerns. We can certainly see the patient much sooner if necessary.   I spent 25 minutes counseling the patient face to face. The total time spent in the appointment was 30 minutes.    Doroteo Bradford, MD 09/28/2013 2:39  PM  DISCLAIMER:  This note was dictated with voice recognition software.  Similar sounding words can inadvertently be transcribed inaccurately and may not be corrected upon review.

## 2013-09-28 NOTE — Patient Instructions (Signed)
[facility] Discharge Instructions  RECOMMENDATIONS MADE BY THE CONSULTANT AND ANY TEST RESULTS WILL BE SENT TO YOUR REFERRING PHYSICIAN.  EXAM FINDINGS BY THE PHYSICIAN TODAY AND SIGNS OR SYMPTOMS TO REPORT TO CLINIC OR PRIMARY PHYSICIAN: Exam and findings as discussed by Dr. Barnet Glasgow.  You have a high TSH and a high hemoglobin but MD does not think any intervention is needed at this time.  Report unusual bruising or bleeding or other concerns.  MEDICATIONS PRESCRIBED:  none  INSTRUCTIONS/FOLLOW-UP: Follow-up in 6 months with labs and office visit.  Thank you for choosing [facility] to provide your oncology and hematology care.  To afford each patient quality time with our providers, please arrive at least 15 minutes before your scheduled appointment time.  With your help, our goal is to use those 15 minutes to complete the necessary work-up to ensure our physicians have the information they need to help with your evaluation and healthcare recommendations.    Effective January 1st, 2014, we ask that you re-schedule your appointment with our physicians should you arrive 10 or more minutes late for your appointment.  We strive to give you quality time with our providers, and arriving late affects you and other patients whose appointments are after yours.    Again, thank you for choosing [facility].  Our hope is that these requests will decrease the amount of time that you wait before being seen by our physicians.       _____________________________________________________________  Should you have questions after your visit to [facility], please contact our office at [phone] between the hours of 8:30 a.m. and 5:00 p.m.  Voicemails left after 4:30 p.m. will not be returned until the following business day.  For prescription refill requests, have your pharmacy contact our office with your prescription refill request.

## 2013-10-06 DIAGNOSIS — J3089 Other allergic rhinitis: Secondary | ICD-10-CM | POA: Diagnosis not present

## 2013-10-07 ENCOUNTER — Other Ambulatory Visit: Payer: Self-pay | Admitting: *Deleted

## 2013-10-07 MED ORDER — FLUTICASONE PROPIONATE 50 MCG/ACT NA SUSP: NASAL | Status: DC

## 2013-10-07 MED ORDER — LEVOTHYROXINE SODIUM 100 MCG PO TABS: 100.0000 ug | ORAL_TABLET | Freq: Every day | ORAL | Status: DC

## 2013-10-09 LAB — JAK2 GENOTYPR: JAK2 GENOTYPR: NOT DETECTED

## 2013-10-19 ENCOUNTER — Telehealth: Payer: Self-pay | Admitting: Family Medicine

## 2013-10-19 DIAGNOSIS — K219 Gastro-esophageal reflux disease without esophagitis: Secondary | ICD-10-CM | POA: Diagnosis not present

## 2013-10-19 DIAGNOSIS — J3089 Other allergic rhinitis: Secondary | ICD-10-CM | POA: Diagnosis not present

## 2013-10-19 DIAGNOSIS — J45909 Unspecified asthma, uncomplicated: Secondary | ICD-10-CM | POA: Diagnosis not present

## 2013-10-19 NOTE — Telephone Encounter (Signed)
LMOM for him to call an make an appt to see Dr Richardson Landry

## 2013-10-19 NOTE — Telephone Encounter (Signed)
Last seen April and a lot going on, so, yes

## 2013-10-19 NOTE — Telephone Encounter (Signed)
Pt wants to know if you want to see him after he does all his blood work at the cancer center?  This BW has been done already

## 2013-10-19 NOTE — Telephone Encounter (Signed)
Patient scheduled follow up office visit.

## 2013-10-19 NOTE — Telephone Encounter (Signed)
Dr Nicki Reaper is his primary,

## 2013-10-22 DIAGNOSIS — J3089 Other allergic rhinitis: Secondary | ICD-10-CM | POA: Diagnosis not present

## 2013-10-30 ENCOUNTER — Encounter: Payer: Self-pay | Admitting: Family Medicine

## 2013-10-30 ENCOUNTER — Ambulatory Visit (INDEPENDENT_AMBULATORY_CARE_PROVIDER_SITE_OTHER): Payer: Medicare Other | Admitting: Family Medicine

## 2013-10-30 VITALS — BP 128/82 | Ht 68.0 in | Wt 194.6 lb

## 2013-10-30 DIAGNOSIS — I10 Essential (primary) hypertension: Secondary | ICD-10-CM

## 2013-10-30 DIAGNOSIS — E039 Hypothyroidism, unspecified: Secondary | ICD-10-CM | POA: Diagnosis not present

## 2013-10-30 DIAGNOSIS — D45 Polycythemia vera: Secondary | ICD-10-CM

## 2013-10-30 DIAGNOSIS — Z23 Encounter for immunization: Secondary | ICD-10-CM | POA: Diagnosis not present

## 2013-10-30 DIAGNOSIS — R109 Unspecified abdominal pain: Secondary | ICD-10-CM

## 2013-10-30 DIAGNOSIS — E785 Hyperlipidemia, unspecified: Secondary | ICD-10-CM | POA: Diagnosis not present

## 2013-10-30 DIAGNOSIS — I2510 Atherosclerotic heart disease of native coronary artery without angina pectoris: Secondary | ICD-10-CM | POA: Diagnosis not present

## 2013-10-30 NOTE — Progress Notes (Signed)
   Subjective:    Patient ID: Thomas [name], male    DOB: 1935/01/16, 79 y.o.   MRN: [id number]  Hypertension This is a chronic problem. The current episode started more than 1 year ago. Pertinent negatives include no chest pain. Risk factors for coronary artery disease include dyslipidemia and male gender. Treatments tried: diovan-Hct. There are no compliance problems.    I discussed with the patient how his visit went with the hematologist. Patient is trying eat healthy He is concerned about getting a tape worm. 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 Patient denies any chest tightness pressure pain shortness of breath currently   Review of Systems  Constitutional: Negative for activity change, appetite change and fatigue.  HENT: Negative for congestion.   Respiratory: Negative for cough.   Cardiovascular: Negative for chest pain.  Gastrointestinal: Negative for abdominal pain.  Endocrine: Negative for polydipsia and polyphagia.  Neurological: Negative for weakness.  Psychiatric/Behavioral: Negative for confusion.       Objective:   Physical Exam  Vitals reviewed. Constitutional: He appears well-nourished. No distress.  Cardiovascular: Normal rate, regular rhythm and normal heart sounds.   No murmur heard. Pulmonary/Chest: Effort normal and breath sounds normal. No respiratory distress.  Musculoskeletal: He exhibits no edema.  Lymphadenopathy:    He has no cervical adenopathy.  Neurological: He is alert.  Psychiatric: His behavior is normal.          Assessment & Plan:  1. HYPERTENSION Blood pressure overall good continue current medications.  2. Unspecified hypothyroidism Recent TSH looks good continue current dose  3. HYPERLIPIDEMIA LDL looking good we'll check lab work later this fall  4. Polycythemia vera(238.4) Does not need phlebotomy. Being followed by hematology.  5. Abdominal pain, unspecified site Patient concerned about the possibility of tape warm  well check O&P x2 - Ova and parasite examination - Ova and parasite examination

## 2013-11-05 DIAGNOSIS — J3089 Other allergic rhinitis: Secondary | ICD-10-CM | POA: Diagnosis not present

## 2013-11-12 DIAGNOSIS — R109 Unspecified abdominal pain: Secondary | ICD-10-CM | POA: Diagnosis not present

## 2013-11-13 LAB — OVA AND PARASITE EXAMINATION: OP: NONE SEEN

## 2013-11-16 MED ORDER — LEVOTHYROXINE SODIUM 100 MCG PO TABS: 100.0000 ug | ORAL_TABLET | Freq: Every day | ORAL | Status: DC

## 2013-11-16 NOTE — Addendum Note (Signed)
Addended by: Dairl Ponder on: 11/16/2013 04:49 PM   Modules accepted: Orders

## 2013-11-19 DIAGNOSIS — J3089 Other allergic rhinitis: Secondary | ICD-10-CM | POA: Diagnosis not present

## 2013-11-25 DIAGNOSIS — J309 Allergic rhinitis, unspecified: Secondary | ICD-10-CM | POA: Diagnosis not present

## 2013-11-28 ENCOUNTER — Other Ambulatory Visit: Payer: Self-pay | Admitting: Family Medicine

## 2013-12-01 DIAGNOSIS — Z961 Presence of intraocular lens: Secondary | ICD-10-CM | POA: Diagnosis not present

## 2013-12-01 DIAGNOSIS — H43399 Other vitreous opacities, unspecified eye: Secondary | ICD-10-CM | POA: Diagnosis not present

## 2013-12-03 DIAGNOSIS — J3089 Other allergic rhinitis: Secondary | ICD-10-CM | POA: Diagnosis not present

## 2013-12-17 DIAGNOSIS — J3089 Other allergic rhinitis: Secondary | ICD-10-CM | POA: Diagnosis not present

## 2013-12-31 DIAGNOSIS — J3089 Other allergic rhinitis: Secondary | ICD-10-CM | POA: Diagnosis not present

## 2014-01-21 ENCOUNTER — Encounter: Payer: Self-pay | Admitting: Family Medicine

## 2014-01-21 ENCOUNTER — Ambulatory Visit (INDEPENDENT_AMBULATORY_CARE_PROVIDER_SITE_OTHER): Payer: Medicare Other | Admitting: Family Medicine

## 2014-01-21 VITALS — BP 118/70 | Ht 68.0 in | Wt 192.0 lb

## 2014-01-21 DIAGNOSIS — J0141 Acute recurrent pansinusitis: Secondary | ICD-10-CM | POA: Diagnosis not present

## 2014-01-21 DIAGNOSIS — Z23 Encounter for immunization: Secondary | ICD-10-CM

## 2014-01-21 DIAGNOSIS — I10 Essential (primary) hypertension: Secondary | ICD-10-CM

## 2014-01-21 DIAGNOSIS — I2510 Atherosclerotic heart disease of native coronary artery without angina pectoris: Secondary | ICD-10-CM | POA: Diagnosis not present

## 2014-01-21 MED ORDER — VALSARTAN-HYDROCHLOROTHIAZIDE 80-12.5 MG PO TABS: ORAL_TABLET | ORAL | Status: DC

## 2014-01-21 MED ORDER — LEVOFLOXACIN 500 MG PO TABS: 500.0000 mg | ORAL_TABLET | Freq: Every day | ORAL | Status: DC

## 2014-01-21 NOTE — Progress Notes (Signed)
   Subjective:    Patient ID: Thomas [name], male    DOB: April 13, 1935, 79 y.o.   MRN: [id number]  Hypertension This is a chronic problem. There are no associated agents to hypertension. There are no known risk factors for coronary artery disease. There are no compliance problems.    Patient is taking Diovan HCT.  Patient wants to know if he needs to have a FLU shot today. Patient states that he received a flu booster a few months ago.   Patient has concerns about having dizziness lately. And having problems with his sinus.   Review of Systems Denies chest tightness pressure pain shortness breath having some moderate head congestion drainage coughing    Objective:   Physical Exam Mild sinus tenderness throat normal neck supple lungs clear       Assessment & Plan:  HTN decent control continue current medications watch salt in diet stay physically active  Immunization given today  Sinusitis antibiotics prescribed followup ongoing trouble  Followup again in 4 months time  Has polycythemia is followed by hematology.  Hypothyroidism stable. Has lab work pending

## 2014-01-28 DIAGNOSIS — J3089 Other allergic rhinitis: Secondary | ICD-10-CM | POA: Diagnosis not present

## 2014-02-04 DIAGNOSIS — J3089 Other allergic rhinitis: Secondary | ICD-10-CM | POA: Diagnosis not present

## 2014-02-16 DIAGNOSIS — J3089 Other allergic rhinitis: Secondary | ICD-10-CM | POA: Diagnosis not present

## 2014-02-25 DIAGNOSIS — J3089 Other allergic rhinitis: Secondary | ICD-10-CM | POA: Diagnosis not present

## 2014-03-09 DIAGNOSIS — J3089 Other allergic rhinitis: Secondary | ICD-10-CM | POA: Diagnosis not present

## 2014-03-23 ENCOUNTER — Ambulatory Visit (HOSPITAL_COMMUNITY): Payer: Medicare Other

## 2014-03-23 DIAGNOSIS — J3089 Other allergic rhinitis: Secondary | ICD-10-CM | POA: Diagnosis not present

## 2014-03-30 ENCOUNTER — Encounter (HOSPITAL_COMMUNITY): Payer: Medicare Other | Attending: Hematology and Oncology

## 2014-03-30 ENCOUNTER — Encounter (HOSPITAL_COMMUNITY): Payer: Self-pay

## 2014-03-30 ENCOUNTER — Encounter (HOSPITAL_BASED_OUTPATIENT_CLINIC_OR_DEPARTMENT_OTHER): Payer: Medicare Other

## 2014-03-30 VITALS — BP 110/65 | HR 63 | Temp 97.9°F | Resp 18 | Wt 195.6 lb

## 2014-03-30 DIAGNOSIS — J309 Allergic rhinitis, unspecified: Secondary | ICD-10-CM | POA: Insufficient documentation

## 2014-03-30 DIAGNOSIS — D751 Secondary polycythemia: Secondary | ICD-10-CM | POA: Insufficient documentation

## 2014-03-30 DIAGNOSIS — I10 Essential (primary) hypertension: Secondary | ICD-10-CM | POA: Insufficient documentation

## 2014-03-30 DIAGNOSIS — J449 Chronic obstructive pulmonary disease, unspecified: Secondary | ICD-10-CM | POA: Diagnosis not present

## 2014-03-30 DIAGNOSIS — E039 Hypothyroidism, unspecified: Secondary | ICD-10-CM | POA: Diagnosis not present

## 2014-03-30 DIAGNOSIS — I2510 Atherosclerotic heart disease of native coronary artery without angina pectoris: Secondary | ICD-10-CM | POA: Diagnosis not present

## 2014-03-30 LAB — COMPREHENSIVE METABOLIC PANEL
ALK PHOS: 91 U/L (ref 39–117)
ALT: 39 U/L (ref 0–53)
AST: 31 U/L (ref 0–37)
Albumin: 3.8 g/dL (ref 3.5–5.2)
Anion gap: 11 (ref 5–15)
BILIRUBIN TOTAL: 0.6 mg/dL (ref 0.3–1.2)
BUN: 13 mg/dL (ref 6–23)
CO2: 28 mEq/L (ref 19–32)
Calcium: 9.1 mg/dL (ref 8.4–10.5)
Chloride: 102 mEq/L (ref 96–112)
Creatinine, Ser: 1 mg/dL (ref 0.50–1.35)
GFR calc non Af Amer: 70 mL/min — ABNORMAL LOW (ref >90)
GFR, EST AFRICAN AMERICAN: 82 mL/min — AB (ref >90)
GLUCOSE: 105 mg/dL — AB (ref 70–99)
POTASSIUM: 3.8 meq/L (ref 3.7–5.3)
Sodium: 141 mEq/L (ref 137–147)
TOTAL PROTEIN: 7 g/dL (ref 6.0–8.3)

## 2014-03-30 LAB — CBC WITH DIFFERENTIAL/PLATELET
Basophils Absolute: 0 10*3/uL (ref 0.0–0.1)
Basophils Relative: 1 % (ref 0–1)
EOS ABS: 0.4 10*3/uL (ref 0.0–0.7)
EOS PCT: 6 % — AB (ref 0–5)
HEMATOCRIT: 48.8 % (ref 39.0–52.0)
Hemoglobin: 17.5 g/dL — ABNORMAL HIGH (ref 13.0–17.0)
Lymphocytes Relative: 22 % (ref 12–46)
Lymphs Abs: 1.3 10*3/uL (ref 0.7–4.0)
MCH: 33 pg (ref 26.0–34.0)
MCHC: 35.9 g/dL (ref 30.0–36.0)
MCV: 91.9 fL (ref 78.0–100.0)
MONO ABS: 0.3 10*3/uL (ref 0.1–1.0)
MONOS PCT: 6 % (ref 3–12)
NEUTROS ABS: 3.9 10*3/uL (ref 1.7–7.7)
Neutrophils Relative %: 65 % (ref 43–77)
Platelets: 155 10*3/uL (ref 150–400)
RBC: 5.31 MIL/uL (ref 4.22–5.81)
RDW: 13.1 % (ref 11.5–15.5)
WBC: 5.9 10*3/uL (ref 4.0–10.5)

## 2014-03-30 LAB — LACTATE DEHYDROGENASE: LDH: 181 U/L (ref 94–250)

## 2014-03-30 NOTE — Patient Instructions (Addendum)
[facility] Discharge Instructions  RECOMMENDATIONS MADE BY THE CONSULTANT AND ANY TEST RESULTS WILL BE SENT TO YOUR REFERRING PHYSICIAN.  EXAM FINDINGS BY THE PHYSICIAN TODAY AND SIGNS OR SYMPTOMS TO REPORT TO CLINIC OR PRIMARY PHYSICIAN: Exam and findings as discussed by Dr. Barnet Glasgow.  You do ne need a phlebotomy at present. Results for Thomas [name], Thomas [name] (MRN [id number]) as of 03/30/2014 13:18  Ref. Range 03/30/2014 12:01  Sodium Latest Range: 137-147 mEq/L 141  Potassium Latest Range: 3.7-5.3 mEq/L 3.8  Chloride Latest Range: 96-112 mEq/L 102  CO2 Latest Range: 19-32 mEq/L 28  BUN Latest Range: 6-23 mg/dL 13  Creatinine Latest Range: 0.50-1.35 mg/dL 1.00  Calcium Latest Range: 8.4-10.5 mg/dL 9.1  GFR calc non Af Amer Latest Range: >90 mL/min 70 (L)  GFR calc Af Amer Latest Range: >90 mL/min 82 (L)  Glucose Latest Range: 70-99 mg/dL 105 (H)  Anion gap Latest Range: 5-15  11  Alkaline Phosphatase Latest Range: 39-117 U/L 91  Albumin Latest Range: 3.5-5.2 g/dL 3.8  AST Latest Range: 0-37 U/L 31  ALT Latest Range: 0-53 U/L 39  Total Protein Latest Range: 6.0-8.3 g/dL 7.0  Total Bilirubin Latest Range: 0.3-1.2 mg/dL 0.6  LDH Latest Range: 94-250 U/L 181  WBC Latest Range: 4.0-10.5 K/uL 5.9  RBC Latest Range: 4.22-5.81 MIL/uL 5.31  Hemoglobin Latest Range: 13.0-17.0 g/dL 17.5 (H)  HCT Latest Range: 39.0-52.0 % 48.8  MCV Latest Range: 78.0-100.0 fL 91.9  MCH Latest Range: 26.0-34.0 pg 33.0  MCHC Latest Range: 30.0-36.0 g/dL 35.9  RDW Latest Range: 11.5-15.5 % 13.1  Platelets Latest Range: 150-400 K/uL 155  Neutrophils Relative % Latest Range: 43-77 % 65  Lymphocytes Relative Latest Range: 12-46 % 22  Monocytes Relative Latest Range: 3-12 % 6  Eosinophils Relative Latest Range: 0-5 % 6 (H)  Basophils Relative Latest Range: 0-1 % 1  NEUT# Latest Range: 1.7-7.7 K/uL 3.9  Lymphocytes Absolute Latest Range: 0.7-4.0 K/uL 1.3  Monocytes Absolute Latest Range:  0.1-1.0 K/uL 0.3  Eosinophils Absolute Latest Range: 0.0-0.7 K/uL 0.4  Basophils Absolute Latest Range: 0.0-0.1 K/uL 0.0      INSTRUCTIONS/FOLLOW-UP: Follow-up in 6 months with labs and office visit.  Thank you for choosing [facility] to provide your oncology and hematology care.  To afford each patient quality time with our providers, please arrive at least 15 minutes before your scheduled appointment time.  With your help, our goal is to use those 15 minutes to complete the necessary work-up to ensure our physicians have the information they need to help with your evaluation and healthcare recommendations.    Effective January 1st, 2014, we ask that you re-schedule your appointment with our physicians should you arrive 10 or more minutes late for your appointment.  We strive to give you quality time with our providers, and arriving late affects you and other patients whose appointments are after yours.    Again, thank you for choosing [facility].  Our hope is that these requests will decrease the amount of time that you wait before being seen by our physicians.       _____________________________________________________________  Should you have questions after your visit to [facility], please contact our office at [phone] between the hours of 8:30 a.m. and 4:30 p.m.  Voicemails left after 4:30 p.m. will not be returned until the following business day.  For prescription refill requests, have your pharmacy contact our office with your prescription refill request.  _______________________________________________________________  We hope that we have given you very good care.  You may receive a patient satisfaction survey in the mail, please complete it and return it as soon as possible.  We value your feedback!  _______________________________________________________________  Have you asked about our STAR program?  STAR stands for  Survivorship Training and Rehabilitation, and this is a nationally recognized cancer care program that focuses on survivorship and rehabilitation.  Cancer and cancer treatments may cause problems, such as, pain, making you feel tired and keeping you from doing the things that you need or want to do. Cancer rehabilitation can help. Our goal is to reduce these troubling effects and help you have the best quality of life possible.  You may receive a survey from a nurse that asks questions about your current state of health.  Based on the survey results, all eligible patients will be referred to the [facility] program for an evaluation so we can better serve you!  A frequently asked questions sheet is available upon request.

## 2014-03-30 NOTE — Progress Notes (Signed)
[facility]  OFFICE PROGRESS NOTE  LUKING,SCOTT, MD [place] [address]  DIAGNOSIS: Polycythemia - Plan: CBC with Differential, Lactate dehydrogenase, Comprehensive metabolic panel  Chief Complaint  Patient presents with  . Polycythemia    CURRENT THERAPY: Watchful expectation for elevated hemoglobin, JAK-2 negative, BCR-ABL negative.  INTERVAL HISTORY: Thomas [name] 79 y.o. male returns for follow-up of polycythemia, JAK-2 and BCR-ABL negative.  He denies any worsening shortness of breath, pruritus, cough, wheezing, but still continues to smoke. Appetite is good with no nausea, vomiting, diarrhea, constipation, melena, hematochezia, hematuria, headache, lower extremity swelling or redness, joint pain, skin rash, or seizures.  MEDICAL HISTORY: Past Medical History  Diagnosis Date  . Asthma   . Pollen allergies   . Kidney stones   . Hypertension   . Sleep apnea     mild no cpap  . Hernia   . History of stress test 2011  . Hypothyroidism   . GERD (gastroesophageal reflux disease)   . Renal calculus     some removed by Cystoscopy, one passed spontaneously  . H/O hiatal hernia   . Arthritis     "my entire back is gone"  . Polycythemia     INTERIM HISTORY: has HYPERLIPIDEMIA; Essential hypertension; CORONARY ATHEROSCLEROSIS NATIVE CORONARY ARTERY; ABNORMAL ELECTROCARDIOGRAM; Chest pain, musculoskeletal; GERD (gastroesophageal reflux disease); Reactive airway disease; Thyroid nodule; Unspecified hypothyroidism; Polycythemia vera(238.4); and Allergic rhinitis on his problem list.    ALLERGIES:  is allergic to penicillins.  MEDICATIONS: has a current medication list which includes the following prescription(s): budesonide-formoterol, fluticasone, gabapentin, levothyroxine, omeprazole, pravastatin, valsartan-hydrochlorothiazide, levofloxacin, and PRESCRIPTION MEDICATION.  SURGICAL HISTORY:  Past Surgical  History  Procedure Laterality Date  . Back surgery      L-4 , L-5  patient states that it was years ago.  Marland Kitchen Appendectomy      50 years ago  . Colonoscopy      at age 49  . Upper gastrointestinal endoscopy      egd/ed  . Eye surgery      cataracts removed- bilateral- /w IOL  . Thyroidectomy Right 09/17/2012    Procedure: RIGHT HEMI-THYROIDECTOMY;  Surgeon: Ascencion Dike, MD;  Location: [facility];  Service: ENT;  Laterality: Right;    FAMILY HISTORY: family history includes Healthy in his son; Heart disease in his brother, father, and mother.  SOCIAL HISTORY:  reports that he has never smoked. His smokeless tobacco use includes Chew. He reports that he drinks alcohol. He reports that he does not use illicit drugs.  REVIEW OF SYSTEMS:  Other than that discussed above is noncontributory.  PHYSICAL EXAMINATION: ECOG PERFORMANCE STATUS: 1 - Symptomatic but completely ambulatory  Blood pressure 110/65, pulse 63, temperature 97.9 F (36.6 C), temperature source Oral, resp. rate 18, weight 195 lb 9.6 oz (88.724 kg), SpO2 97 %.  GENERAL:alert, no distress and comfortable SKIN: skin color, texture, turgor are normal, no rashes or significant lesions EYES: PERLA; Conjunctiva are pink and non-injected, sclera clear SINUSES: No redness or tenderness over maxillary or ethmoid sinuses OROPHARYNX:no exudate, no erythema on lips, buccal mucosa, or tongue. NECK: supple, thyroid normal size, non-tender, without nodularity. No masses CHEST: Increased AP diameter with no breast masses. LYMPH:  no palpable lymphadenopathy in the cervical, axillary or inguinal LUNGS: clear to auscultation and percussion with normal breathing effort HEART: regular rate & rhythm and no murmurs, P2 not audible at the apex. ABDOMEN:abdomen soft,  non-tender and normal bowel sounds. No hepatomegaly, ascites, or CVA tenderness MUSCULOSKELETAL:no cyanosis of digits and no clubbing. Range of motion normal.  NEURO: alert & oriented x 3  with fluent speech, no focal motor/sensory deficits   LABORATORY DATA: Lab on 03/30/2014  Component Date Value Ref Range Status  . WBC 03/30/2014 5.9  4.0 - 10.5 K/uL Final  . RBC 03/30/2014 5.31  4.22 - 5.81 MIL/uL Final  . Hemoglobin 03/30/2014 17.5* 13.0 - 17.0 g/dL Final  . HCT 03/30/2014 48.8  39.0 - 52.0 % Final  . MCV 03/30/2014 91.9  78.0 - 100.0 fL Final  . MCH 03/30/2014 33.0  26.0 - 34.0 pg Final  . MCHC 03/30/2014 35.9  30.0 - 36.0 g/dL Final  . RDW 03/30/2014 13.1  11.5 - 15.5 % Final  . Platelets 03/30/2014 155  150 - 400 K/uL Final  . Neutrophils Relative % 03/30/2014 65  43 - 77 % Final  . Neutro Abs 03/30/2014 3.9  1.7 - 7.7 K/uL Final  . Lymphocytes Relative 03/30/2014 22  12 - 46 % Final  . Lymphs Abs 03/30/2014 1.3  0.7 - 4.0 K/uL Final  . Monocytes Relative 03/30/2014 6  3 - 12 % Final  . Monocytes Absolute 03/30/2014 0.3  0.1 - 1.0 K/uL Final  . Eosinophils Relative 03/30/2014 6* 0 - 5 % Final  . Eosinophils Absolute 03/30/2014 0.4  0.0 - 0.7 K/uL Final  . Basophils Relative 03/30/2014 1  0 - 1 % Final  . Basophils Absolute 03/30/2014 0.0  0.0 - 0.1 K/uL Final  . LDH 03/30/2014 181  94 - 250 U/L Final  . Sodium 03/30/2014 141  137 - 147 mEq/L Final  . Potassium 03/30/2014 3.8  3.7 - 5.3 mEq/L Final  . Chloride 03/30/2014 102  96 - 112 mEq/L Final  . CO2 03/30/2014 28  19 - 32 mEq/L Final  . Glucose, Bld 03/30/2014 105* 70 - 99 mg/dL Final  . BUN 03/30/2014 13  6 - 23 mg/dL Final  . Creatinine, Ser 03/30/2014 1.00  0.50 - 1.35 mg/dL Final  . Calcium 03/30/2014 9.1  8.4 - 10.5 mg/dL Final  . Total Protein 03/30/2014 7.0  6.0 - 8.3 g/dL Final  . Albumin 03/30/2014 3.8  3.5 - 5.2 g/dL Final  . AST 03/30/2014 31  0 - 37 U/L Final  . ALT 03/30/2014 39  0 - 53 U/L Final  . Alkaline Phosphatase 03/30/2014 91  39 - 117 U/L Final  . Total Bilirubin 03/30/2014 0.6  0.3 - 1.2 mg/dL Final  . GFR calc non Af Amer 03/30/2014 70* >90 mL/min Final  . GFR calc Af Amer  03/30/2014 82* >90 mL/min Final   Comment: (NOTE) The eGFR has been calculated using the CKD EPI equation. This calculation has not been validated in all clinical situations. eGFR's persistently <90 mL/min signify possible Chronic Kidney Disease.   . Anion gap 03/30/2014 11  5 - 15 Final    PATHOLOGY: Peripheral smear failed to reveal evidence of premature forms.  Urinalysis No results found for: COLORURINE, APPEARANCEUR, LABSPEC, PHURINE, GLUCOSEU, HGBUR, BILIRUBINUR, KETONESUR, PROTEINUR, UROBILINOGEN, NITRITE, LEUKOCYTESUR  RADIOGRAPHIC STUDIES: No results found.  ASSESSMENT:  #1. Secondary polycythemia. #2. Chronic obstructive pulmonary disease. #3. Hypothyroidism, on treatment. #4. Allergic rhinitis with asthma, stable. #5. Hypertension, controlled. #6. Coronary artery disease, no evidence of dysrhythmia or heart failure.   PLAN:  #1. No phlebotomy. Patient requires red cell mass to counteract chronic hypoxemia. #2. Continue current medications. #3.  Follow-up in 6 months with CBC, chem profile, LDH.   All questions were answered. The patient knows to call the clinic with any problems, questions or concerns. We can certainly see the patient much sooner if necessary.   I spent 25 minutes counseling the patient face to face. The total time spent in the appointment was 30 minutes.    Doroteo Bradford, MD 03/30/2014 2:22 PM  DISCLAIMER:  This note was dictated with voice recognition software.  Similar sounding words can inadvertently be transcribed inaccurately and may not be corrected upon review.

## 2014-03-30 NOTE — Progress Notes (Signed)
Labs for cbcd,cmp,ldh  

## 2014-03-30 NOTE — Addendum Note (Signed)
Addended by: Mellissa Kohut on: 03/30/2014 06:52 PM   Modules accepted: Orders

## 2014-04-06 ENCOUNTER — Encounter: Payer: Self-pay | Admitting: Nurse Practitioner

## 2014-04-06 ENCOUNTER — Ambulatory Visit (INDEPENDENT_AMBULATORY_CARE_PROVIDER_SITE_OTHER): Payer: Medicare Other | Admitting: Nurse Practitioner

## 2014-04-06 VITALS — BP 136/80 | Temp 98.9°F | Ht 68.0 in | Wt 198.0 lb

## 2014-04-06 DIAGNOSIS — J329 Chronic sinusitis, unspecified: Secondary | ICD-10-CM

## 2014-04-06 DIAGNOSIS — I2510 Atherosclerotic heart disease of native coronary artery without angina pectoris: Secondary | ICD-10-CM | POA: Diagnosis not present

## 2014-04-06 DIAGNOSIS — J310 Chronic rhinitis: Secondary | ICD-10-CM

## 2014-04-06 DIAGNOSIS — J3089 Other allergic rhinitis: Secondary | ICD-10-CM | POA: Diagnosis not present

## 2014-04-06 MED ORDER — LEVOFLOXACIN 500 MG PO TABS: 500.0000 mg | ORAL_TABLET | Freq: Every day | ORAL | Status: DC

## 2014-04-10 ENCOUNTER — Encounter: Payer: Self-pay | Admitting: Nurse Practitioner

## 2014-04-10 NOTE — Progress Notes (Signed)
Subjective:  Presents for c/o sinus pressure over the past week. Cough mainly in the mornings. Producing green/yellow mucus. Facial area headache. No sore throat or ear pain. Slight wheeze. Used ProAir inhaler twice yesterday, none today.   Objective:   BP 136/80 mmHg  Temp(Src) 98.9 F (37.2 C)  Ht 5\' 8"  (1.727 m)  Wt 198 lb (89.812 kg)  BMI 30.11 kg/m2 NAD. Alert, oriented. TMs clear effusion. Pharynx injected with green PND noted. Neck supple with mild anterior adenopathy. Lungs clear. Heart RRR.  Assessment: Rhinosinusitis  Plan:  Meds ordered this encounter  Medications  . levofloxacin (LEVAQUIN) 500 MG tablet    Sig: Take 1 tablet (500 mg total) by mouth daily.    Dispense:  10 tablet    Refill:  0    Order Specific Question:  Supervising Provider    Answer:  Mikey Kirschner [[id number]]   OTC meds as directed. Call back if worsens or persists.

## 2014-05-11 ENCOUNTER — Other Ambulatory Visit: Payer: Self-pay

## 2014-05-11 DIAGNOSIS — K219 Gastro-esophageal reflux disease without esophagitis: Secondary | ICD-10-CM

## 2014-05-11 MED ORDER — OMEPRAZOLE 20 MG PO CPDR: 20.0000 mg | DELAYED_RELEASE_CAPSULE | Freq: Every day | ORAL | Status: DC

## 2014-05-11 MED ORDER — GABAPENTIN 400 MG PO CAPS: 400.0000 mg | ORAL_CAPSULE | Freq: Four times a day (QID) | ORAL | Status: DC

## 2014-05-11 MED ORDER — PRAVASTATIN SODIUM 40 MG PO TABS: ORAL_TABLET | ORAL | Status: DC

## 2014-05-27 ENCOUNTER — Other Ambulatory Visit: Payer: Self-pay | Admitting: *Deleted

## 2014-05-27 MED ORDER — PRAVASTATIN SODIUM 40 MG PO TABS: ORAL_TABLET | ORAL | Status: DC

## 2014-07-31 ENCOUNTER — Other Ambulatory Visit: Payer: Self-pay | Admitting: Family Medicine

## 2014-08-11 ENCOUNTER — Ambulatory Visit (INDEPENDENT_AMBULATORY_CARE_PROVIDER_SITE_OTHER): Payer: Medicare Other | Admitting: Family Medicine

## 2014-08-11 ENCOUNTER — Encounter: Payer: Self-pay | Admitting: Family Medicine

## 2014-08-11 VITALS — BP 114/70 | Ht 68.0 in | Wt 196.4 lb

## 2014-08-11 DIAGNOSIS — G629 Polyneuropathy, unspecified: Secondary | ICD-10-CM

## 2014-08-11 DIAGNOSIS — E038 Other specified hypothyroidism: Secondary | ICD-10-CM | POA: Diagnosis not present

## 2014-08-11 DIAGNOSIS — I10 Essential (primary) hypertension: Secondary | ICD-10-CM

## 2014-08-11 DIAGNOSIS — J4530 Mild persistent asthma, uncomplicated: Secondary | ICD-10-CM | POA: Diagnosis not present

## 2014-08-11 DIAGNOSIS — N289 Disorder of kidney and ureter, unspecified: Secondary | ICD-10-CM

## 2014-08-11 DIAGNOSIS — E785 Hyperlipidemia, unspecified: Secondary | ICD-10-CM

## 2014-08-11 MED ORDER — PRAVASTATIN SODIUM 40 MG PO TABS: ORAL_TABLET | ORAL | Status: DC

## 2014-08-11 MED ORDER — GABAPENTIN 400 MG PO CAPS: ORAL_CAPSULE | ORAL | Status: DC

## 2014-08-11 MED ORDER — OMEPRAZOLE 20 MG PO CPDR: DELAYED_RELEASE_CAPSULE | ORAL | Status: DC

## 2014-08-11 MED ORDER — LEVOTHYROXINE SODIUM 100 MCG PO TABS: 100.0000 ug | ORAL_TABLET | Freq: Every day | ORAL | Status: DC

## 2014-08-11 MED ORDER — VALSARTAN-HYDROCHLOROTHIAZIDE 80-12.5 MG PO TABS: ORAL_TABLET | ORAL | Status: DC

## 2014-08-11 NOTE — Progress Notes (Signed)
   Subjective:    Patient ID: Thomas [name], male    DOB: 1934-07-31, 80 y.o.   MRN: [id number]  Hypertension This is a chronic problem. The current episode started more than 1 year ago. The problem has been gradually improving since onset. The problem is controlled. Pertinent negatives include no chest pain. There are no associated agents to hypertension. There are no known risk factors for coronary artery disease. Treatments tried: Diovan-HCT. The current treatment provides significant improvement. There are no compliance problems.    Patient states that he has no concerns at this time.  25 minutes spent with patient discussing his COPD/reactive airway/hypothyroidism/hyperlipidemia/renal insufficiency/hypertension. Importance of a healthy diet taking medications on a regular basis were all discussed as well  Review of Systems  Constitutional: Negative for activity change, appetite change and fatigue.  HENT: Negative for congestion.   Respiratory: Negative for cough.   Cardiovascular: Negative for chest pain.  Gastrointestinal: Negative for abdominal pain.  Endocrine: Negative for polydipsia and polyphagia.  Neurological: Negative for weakness.  Psychiatric/Behavioral: Negative for confusion.       Objective:   Physical Exam  Constitutional: He appears well-nourished. No distress.  Cardiovascular: Normal rate, regular rhythm and normal heart sounds.   No murmur heard. Pulmonary/Chest: Effort normal and breath sounds normal. No respiratory distress.  Musculoskeletal: He exhibits no edema.  Lymphadenopathy:    He has no cervical adenopathy.  Neurological: He is alert.  Psychiatric: His behavior is normal.  Vitals reviewed.         Assessment & Plan:  1. Essential hypertension Blood pressure under good control check metabolic 7 watch diet stay active  - Hepatic function panel - Basic metabolic panel  2. Mild renal insufficiency History of renal insufficiency check  metabolic 7 await results - Hepatic function panel - Basic metabolic panel  3. Hyperlipidemia Hyperlipidemia check lipid profile watch diet - Lipid panel  4. Reactive airway disease, mild persistent, uncomplicated Continue inhalers lungs are doing good use face mask when around pollen and other measures  5. Peripheral neuropathy Increase Neurontin 1 in the morning one he afternoon to evening.  6. Other specified hypothyroidism Check thyroid function continue current medication - TSH Follow-up 6 months

## 2014-08-17 ENCOUNTER — Encounter: Payer: Self-pay | Admitting: Family Medicine

## 2014-08-17 LAB — LIPID PANEL
Chol/HDL Ratio: 3.9 ratio units (ref 0.0–5.0)
Cholesterol, Total: 138 mg/dL (ref 100–199)
HDL: 35 mg/dL — ABNORMAL LOW (ref >39)
LDL CALC: 63 mg/dL (ref 0–99)
TRIGLYCERIDES: 198 mg/dL — AB (ref 0–149)
VLDL Cholesterol Cal: 40 mg/dL (ref 5–40)

## 2014-08-17 LAB — BASIC METABOLIC PANEL
BUN / CREAT RATIO: 9 — AB (ref 10–22)
BUN: 9 mg/dL (ref 8–27)
CHLORIDE: 102 mmol/L (ref 97–108)
CO2: 26 mmol/L (ref 18–29)
Calcium: 9.1 mg/dL (ref 8.6–10.2)
Creatinine, Ser: 1.03 mg/dL (ref 0.76–1.27)
GFR calc Af Amer: 81 mL/min/{1.73_m2} (ref >59)
GFR calc non Af Amer: 70 mL/min/{1.73_m2} (ref >59)
Glucose: 102 mg/dL — ABNORMAL HIGH (ref 65–99)
POTASSIUM: 4.3 mmol/L (ref 3.5–5.2)
Sodium: 142 mmol/L (ref 134–144)

## 2014-08-17 LAB — TSH: TSH: 0.685 u[IU]/mL (ref 0.450–4.500)

## 2014-08-17 LAB — HEPATIC FUNCTION PANEL
ALT: 33 IU/L (ref 0–44)
AST: 26 IU/L (ref 0–40)
Albumin: 4.1 g/dL (ref 3.5–4.8)
Alkaline Phosphatase: 92 IU/L (ref 39–117)
BILIRUBIN TOTAL: 0.9 mg/dL (ref 0.0–1.2)
BILIRUBIN, DIRECT: 0.19 mg/dL (ref 0.00–0.40)
Total Protein: 6.3 g/dL (ref 6.0–8.5)

## 2014-09-21 ENCOUNTER — Encounter: Payer: Self-pay | Admitting: Family Medicine

## 2014-09-29 ENCOUNTER — Other Ambulatory Visit (HOSPITAL_COMMUNITY): Payer: Medicare Other

## 2014-09-29 ENCOUNTER — Ambulatory Visit (HOSPITAL_COMMUNITY): Payer: Medicare Other | Admitting: Hematology & Oncology

## 2014-09-29 ENCOUNTER — Encounter (HOSPITAL_COMMUNITY): Payer: Medicare Other | Attending: Hematology & Oncology

## 2014-09-29 ENCOUNTER — Encounter (HOSPITAL_COMMUNITY): Payer: Self-pay | Admitting: Hematology & Oncology

## 2014-09-29 ENCOUNTER — Encounter (HOSPITAL_BASED_OUTPATIENT_CLINIC_OR_DEPARTMENT_OTHER): Payer: Medicare Other | Admitting: Hematology & Oncology

## 2014-09-29 VITALS — BP 124/70 | HR 61 | Temp 98.2°F | Resp 16 | Wt 196.9 lb

## 2014-09-29 DIAGNOSIS — Z720 Tobacco use: Secondary | ICD-10-CM | POA: Insufficient documentation

## 2014-09-29 DIAGNOSIS — D751 Secondary polycythemia: Secondary | ICD-10-CM | POA: Insufficient documentation

## 2014-09-29 DIAGNOSIS — J4520 Mild intermittent asthma, uncomplicated: Secondary | ICD-10-CM | POA: Insufficient documentation

## 2014-09-29 DIAGNOSIS — J45909 Unspecified asthma, uncomplicated: Secondary | ICD-10-CM

## 2014-09-29 LAB — CBC WITH DIFFERENTIAL/PLATELET
BASOS ABS: 0 10*3/uL (ref 0.0–0.1)
Basophils Relative: 0 % (ref 0–1)
EOS PCT: 3 % (ref 0–5)
Eosinophils Absolute: 0.2 10*3/uL (ref 0.0–0.7)
HCT: 51.8 % (ref 39.0–52.0)
Hemoglobin: 18.6 g/dL — ABNORMAL HIGH (ref 13.0–17.0)
LYMPHS PCT: 23 % (ref 12–46)
Lymphs Abs: 1.5 10*3/uL (ref 0.7–4.0)
MCH: 33.8 pg (ref 26.0–34.0)
MCHC: 35.9 g/dL (ref 30.0–36.0)
MCV: 94 fL (ref 78.0–100.0)
MONO ABS: 0.5 10*3/uL (ref 0.1–1.0)
MONOS PCT: 7 % (ref 3–12)
NEUTROS PCT: 67 % (ref 43–77)
Neutro Abs: 4.3 10*3/uL (ref 1.7–7.7)
PLATELETS: 162 10*3/uL (ref 150–400)
RBC: 5.51 MIL/uL (ref 4.22–5.81)
RDW: 13.4 % (ref 11.5–15.5)
WBC: 6.5 10*3/uL (ref 4.0–10.5)

## 2014-09-29 LAB — COMPREHENSIVE METABOLIC PANEL
ALK PHOS: 76 U/L (ref 38–126)
ALT: 44 U/L (ref 17–63)
AST: 37 U/L (ref 15–41)
Albumin: 4.1 g/dL (ref 3.5–5.0)
Anion gap: 10 (ref 5–15)
BUN: 13 mg/dL (ref 6–20)
CHLORIDE: 102 mmol/L (ref 101–111)
CO2: 25 mmol/L (ref 22–32)
Calcium: 8.8 mg/dL — ABNORMAL LOW (ref 8.9–10.3)
Creatinine, Ser: 1.05 mg/dL (ref 0.61–1.24)
GFR calc Af Amer: >60 mL/min (ref >60)
Glucose, Bld: 108 mg/dL — ABNORMAL HIGH (ref 65–99)
POTASSIUM: 3.7 mmol/L (ref 3.5–5.1)
SODIUM: 137 mmol/L (ref 135–145)
Total Bilirubin: 1 mg/dL (ref 0.3–1.2)
Total Protein: 6.9 g/dL (ref 6.5–8.1)

## 2014-09-29 LAB — LACTATE DEHYDROGENASE: LDH: 174 U/L (ref 98–192)

## 2014-09-29 NOTE — Progress Notes (Signed)
[facility] Note  Patient Care Team: [name], MD as PCP - General (Family Medicine)  CHIEF COMPLAINTS/PURPOSE OF CONSULTATION:  Polycythemia, JAK-2 negative   HISTORY OF PRESENTING ILLNESS:  Thomas [name] 80 y.o. male is here because of JAK-2 negative polycythemia. He has never smoked but used chewing tobacco most of his life.  He is present alone today. 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 He has never been hospitalized for an asthma exacerbation.  He has previously been tested for sleep anea year ago, was told it was not bad enough to treat.  He snores. Wakes up feeling well rested most days, does take a mid-day nap however.  Neuropathy in his fingers, takes Gabapentin Bad disc disease in his back States that he does wheeze at times Has had a Colonscopy done before  MEDICAL HISTORY:  Past Medical History  Diagnosis Date  . Asthma   . Pollen allergies   . Kidney stones   . Hypertension   . Sleep apnea     mild no cpap  . Hernia   . History of stress test 2011  . Hypothyroidism   . GERD (gastroesophageal reflux disease)   . Renal calculus     some removed by Cystoscopy, one passed spontaneously  . H/O hiatal hernia   . Arthritis     "my entire back is gone"  . Polycythemia     SURGICAL HISTORY: Past Surgical History  Procedure Laterality Date  . Back surgery      L-4 , L-5  patient states that it was years ago.  Marland Kitchen Appendectomy      50 years ago  . Colonoscopy      at age 73  . Upper gastrointestinal endoscopy      egd/ed  . Eye surgery      cataracts removed- bilateral- /w IOL  . Thyroidectomy Right 09/17/2012    Procedure: RIGHT HEMI-THYROIDECTOMY;  Surgeon: Ascencion Dike, MD;  Location: [facility];  Service: ENT;  Laterality: Right;    SOCIAL HISTORY: History   Social History  . Marital Status: Widowed    Spouse Name: N/A  . Number of Children: N/A  . Years of Education: N/A   Occupational History  . Not on file.   Social History Main Topics  . Smoking status: Never Smoker   . Smokeless tobacco: Current User    Types: Chew     Comment: Patient states that he has been chewing for 60 plus years  . Alcohol Use: Yes     Comment: Patient states that he drinks a bout a case of beer a year  . Drug Use: No  . Sexual Activity: No   Other Topics Concern  . Not on file   Social History Narrative    FAMILY HISTORY: Family History  Problem Relation Age of Onset  . Heart disease Mother   . Heart disease Father   . Heart disease Brother   . Healthy Son    indicated that his mother is deceased. He indicated that his father is deceased. He indicated that his sister is deceased. He indicated that his brother is deceased. He indicated that his son  is alive.   ALLERGIES:  is allergic to penicillins.  MEDICATIONS:  Current Outpatient Prescriptions  Medication Sig Dispense Refill  . budesonide-formoterol (SYMBICORT) 160-4.5 MCG/ACT inhaler Inhale 1 puff into the lungs 2 (two) times daily.     . fluticasone (FLONASE) 50 MCG/ACT nasal spray USE 2 SPRAYS EACH NOSTRIL EVERY DAY 16 g 5  . gabapentin (NEURONTIN) 400 MG capsule Take 1 tab in am, 1 tab mid day, 2 tabs at bedtime 120 capsule 5  . levothyroxine (SYNTHROID, LEVOTHROID) 100 MCG tablet Take 1 tablet (100 mcg total) by mouth daily before breakfast. 90 tablet 1  . omeprazole (PRILOSEC) 20 MG capsule TAKE (1) CAPSULE BY MOUTH ONCE DAILY. 90 capsule 1  . pravastatin (PRAVACHOL) 40 MG tablet TAKE ONE TABLET AT BEDTIME AS DIRECTED. 90 tablet 1  . PRESCRIPTION MEDICATION Allergy shot weekly at Dr. Luan Pulling office    . SYMBICORT 80-4.5 MCG/ACT inhaler     . valsartan-hydrochlorothiazide (DIOVAN-HCT)  80-12.5 MG per tablet TAKE 1 TABLET BY MOUTH EVERY DAY 90 tablet 1   No current facility-administered medications for this visit.    Review of Systems  Constitutional: Negative for fever, chills, weight loss and malaise/fatigue.  HENT: Negative for congestion, hearing loss, nosebleeds, sore throat and tinnitus.   Eyes: Negative for blurred vision, double vision, pain and discharge.  Respiratory: Negative for cough, hemoptysis, sputum production, shortness of breath and wheezing.        Rare wheezing or SOB  Cardiovascular: Negative for chest pain, palpitations, claudication, leg swelling and PND.  Gastrointestinal: Negative for heartburn, nausea, vomiting, abdominal pain, diarrhea, constipation, blood in stool and melena.  Genitourinary: Negative for dysuria, urgency, frequency and hematuria.  Musculoskeletal: Negative for myalgias, joint pain and falls.  Skin: Negative for itching and rash.  Neurological: Negative for dizziness, tingling, tremors, sensory change, speech change, focal weakness, seizures, loss of consciousness, weakness and headaches.  Endo/Heme/Allergies: Does not bruise/bleed easily.  Psychiatric/Behavioral: Negative for depression, suicidal ideas, memory loss and substance abuse. The patient is not nervous/anxious and does not have insomnia.    14 point ROS was done and is otherwise as detailed above or in HPI   PHYSICAL EXAMINATION: ECOG PERFORMANCE STATUS: 0 - Asymptomatic  Filed Vitals:   09/29/14 1327  BP: 124/70  Pulse: 61  Temp: 98.2 F (36.8 C)  Resp: 16   Filed Weights   09/29/14 1327  Weight: 196 lb 14.4 oz (89.313 kg)    Physical Exam  Constitutional: He is oriented to person, place, and time and well-developed, well-nourished, and in no distress.  Appears younger than stated age  HENT:  Head: Normocephalic and atraumatic.  Nose: Nose normal.  Mouth/Throat: Oropharynx is clear and moist. No oropharyngeal exudate.  Eyes: Conjunctivae and EOM  are normal. Pupils are equal, round, and reactive to light. Right eye exhibits no discharge. Left eye exhibits no discharge. No scleral icterus.  Neck: Normal range of motion. Neck supple. No tracheal deviation present. No thyromegaly present.  Cardiovascular: Normal rate, regular rhythm and normal heart sounds.  Exam reveals no gallop and no friction rub.   No murmur heard. Pulmonary/Chest: Effort normal and breath sounds normal. He has no wheezes. He has no rales.  Abdominal: Soft. Bowel sounds are normal. He exhibits no distension and no mass. There is no tenderness. There is no rebound and no guarding.  Musculoskeletal: Normal range of motion. He exhibits no edema.  Lymphadenopathy:    He has no cervical adenopathy.  Neurological: He is alert  and oriented to person, place, and time. He has normal reflexes. No cranial nerve deficit. Gait normal. Coordination normal.  Skin: Skin is warm and dry. No rash noted.  Psychiatric: Mood, memory, affect and judgment normal.  Nursing note and vitals reviewed.    LABORATORY DATA:  I have reviewed the data as listed Lab Results  Component Value Date   WBC 6.5 09/29/2014   HGB 18.6* 09/29/2014   HCT 51.8 09/29/2014   MCV 94.0 09/29/2014   PLT 162 09/29/2014     ASSESSMENT & PLAN:  Polycythemia JAK-2 V617 F mutation negative Asthma No history of cigarette use  He has a history of asthma but does not appear to have her have been limited by his disease. He is very physically active. He only uses a Symbicort inhaler. He has never been hospitalized for an asthma exacerbation. He has never smoked. He has been tested for sleep apnea and was told it was negative. At this point I am not completely convinced his polycythemia is secondary. An erythropoietin level was checked last year and was 9.4 mIU/ml.  I discussed additional genetic testing with the patient and he is open to this. We will arrange for this and I advised him based upon the results  additional recommendations will follow. He may need a bone marrow biopsy but we will address this at future follow-up. For now we will arrange for additional laboratory studies. I will tentatively schedule him for six-month follow-up based upon the results of these labs. If any of them are positive I will bring him back in much sooner to discuss.  All questions were answered. The patient knows to call the clinic with any problems, questions or concerns.   This document serves as a record of services personally performed by Ancil Linsey, MD. It was created on her behalf by Janace Hoard, a trained medical scribe. The creation of this record is based on the scribe's personal observations and the provider's statements to them. This document has been checked and approved by the attending provider.  I have reviewed the above documentation for accuracy and completeness, and I agree with the above.  This note was electronically signed.   Kelby Fam. [place]

## 2014-09-29 NOTE — Patient Instructions (Signed)
..  [place] at [facility] Discharge Instructions  RECOMMENDATIONS MADE BY THE CONSULTANT AND ANY TEST RESULTS WILL BE SENT TO YOUR REFERRING PHYSICIAN.  Exam per Dr. Whitney Muse  6 months with labs   Thank you for choosing [place] at [facility] to provide your oncology and hematology care.  To afford each patient quality time with our provider, please arrive at least 15 minutes before your scheduled appointment time.    You need to re-schedule your appointment should you arrive 10 or more minutes late.  We strive to give you quality time with our providers, and arriving late affects you and other patients whose appointments are after yours.  Also, if you no show three or more times for appointments you may be dismissed from the clinic at the providers discretion.     Again, thank you for choosing [facility].  Our hope is that these requests will decrease the amount of time that you wait before being seen by our physicians.       _____________________________________________________________  Should you have questions after your visit to [facility], please contact our office at [phone] between the hours of 8:30 a.m. and 4:30 p.m.  Voicemails left after 4:30 p.m. will not be returned until the following business day.  For prescription refill requests, have your pharmacy contact our office.

## 2014-09-29 NOTE — Progress Notes (Signed)
LABS DRAWN

## 2014-10-12 ENCOUNTER — Encounter (HOSPITAL_COMMUNITY): Payer: Self-pay | Admitting: Hematology & Oncology

## 2014-10-20 ENCOUNTER — Other Ambulatory Visit (HOSPITAL_COMMUNITY): Payer: Self-pay

## 2014-10-20 DIAGNOSIS — D45 Polycythemia vera: Secondary | ICD-10-CM

## 2014-11-17 ENCOUNTER — Encounter (INDEPENDENT_AMBULATORY_CARE_PROVIDER_SITE_OTHER): Payer: Self-pay | Admitting: *Deleted

## 2014-11-17 ENCOUNTER — Encounter (HOSPITAL_COMMUNITY): Payer: Medicare Other | Attending: Hematology & Oncology

## 2014-11-17 DIAGNOSIS — Z720 Tobacco use: Secondary | ICD-10-CM | POA: Insufficient documentation

## 2014-11-17 DIAGNOSIS — D45 Polycythemia vera: Secondary | ICD-10-CM

## 2014-11-17 DIAGNOSIS — D751 Secondary polycythemia: Secondary | ICD-10-CM

## 2014-11-17 DIAGNOSIS — J4520 Mild intermittent asthma, uncomplicated: Secondary | ICD-10-CM | POA: Insufficient documentation

## 2014-11-17 LAB — CBC
HCT: 50.3 % (ref 39.0–52.0)
Hemoglobin: 17.9 g/dL — ABNORMAL HIGH (ref 13.0–17.0)
MCH: 33.8 pg (ref 26.0–34.0)
MCHC: 35.6 g/dL (ref 30.0–36.0)
MCV: 94.9 fL (ref 78.0–100.0)
PLATELETS: 152 10*3/uL (ref 150–400)
RBC: 5.3 MIL/uL (ref 4.22–5.81)
RDW: 13.4 % (ref 11.5–15.5)
WBC: 5.4 10*3/uL (ref 4.0–10.5)

## 2014-11-17 NOTE — Progress Notes (Signed)
LABS DRAWN

## 2014-11-18 ENCOUNTER — Ambulatory Visit (INDEPENDENT_AMBULATORY_CARE_PROVIDER_SITE_OTHER): Payer: Medicare Other | Admitting: Nurse Practitioner

## 2014-11-18 ENCOUNTER — Encounter: Payer: Self-pay | Admitting: Nurse Practitioner

## 2014-11-18 VITALS — BP 108/80 | Temp 98.5°F | Ht 68.0 in | Wt 196.0 lb

## 2014-11-18 DIAGNOSIS — B9689 Other specified bacterial agents as the cause of diseases classified elsewhere: Secondary | ICD-10-CM

## 2014-11-18 DIAGNOSIS — J069 Acute upper respiratory infection, unspecified: Secondary | ICD-10-CM

## 2014-11-18 MED ORDER — LEVOFLOXACIN 500 MG PO TABS: 500.0000 mg | ORAL_TABLET | Freq: Every day | ORAL | Status: DC

## 2014-11-18 MED ORDER — ALBUTEROL SULFATE HFA 108 (90 BASE) MCG/ACT IN AERS: 2.0000 | INHALATION_SPRAY | RESPIRATORY_TRACT | Status: DC | PRN

## 2014-11-20 ENCOUNTER — Encounter: Payer: Self-pay | Admitting: Nurse Practitioner

## 2014-11-20 NOTE — Progress Notes (Signed)
Subjective:  Presents for c/o head congestion and cough x 4-5 d. Fever yesterday. Sore throat better. Generalized headache. Increased cough at night. Producing green sputum. PND. No ear pain. No wheeze but needs refill on inhaler.   Objective:   BP 108/80 mmHg  Temp(Src) 98.5 F (36.9 C) (Oral)  Ht 5\' 8"  (1.727 m)  Wt 196 lb (88.905 kg)  BMI 29.81 kg/m2 NAD. Alert, oriented. TMs retracted, no erythema. Pharynx mild erythema with green PND noted. Neck supple with mild anterior adenopathy. Lungs clear. Heart RRR.   Assessment: Bacterial upper respiratory infection  Plan:  Meds ordered this encounter  Medications  . levofloxacin (LEVAQUIN) 500 MG tablet    Sig: Take 1 tablet (500 mg total) by mouth daily.    Dispense:  10 tablet    Refill:  0    Order Specific Question:  Supervising Provider    Answer:  Mikey Kirschner [[id number]]  . albuterol (PROVENTIL HFA;VENTOLIN HFA) 108 (90 BASE) MCG/ACT inhaler    Sig: Inhale 2 puffs into the lungs every 4 (four) hours as needed.    Dispense:  1 Inhaler    Refill:  0    Order Specific Question:  Supervising Provider    Answer:  Mikey Kirschner [[id number]]   Discussed risks associated with Levaquin. Continue claritin and flonase as directed.  Return if symptoms worsen or fail to improve.

## 2014-12-04 ENCOUNTER — Other Ambulatory Visit: Payer: Self-pay | Admitting: Family Medicine

## 2015-02-03 ENCOUNTER — Other Ambulatory Visit: Payer: Self-pay | Admitting: Family Medicine

## 2015-02-04 ENCOUNTER — Emergency Department (HOSPITAL_COMMUNITY): Payer: Medicare Other

## 2015-02-04 ENCOUNTER — Telehealth: Payer: Self-pay | Admitting: Family Medicine

## 2015-02-04 ENCOUNTER — Observation Stay (HOSPITAL_COMMUNITY)
Admission: EM | Admit: 2015-02-04 | Discharge: 2015-02-05 | Disposition: A | Discharge status: Home / Self Care | Payer: Medicare Other | Attending: Internal Medicine | Admitting: Internal Medicine

## 2015-02-04 ENCOUNTER — Encounter (HOSPITAL_COMMUNITY): Payer: Self-pay | Admitting: *Deleted

## 2015-02-04 DIAGNOSIS — K219 Gastro-esophageal reflux disease without esophagitis: Secondary | ICD-10-CM | POA: Diagnosis present

## 2015-02-04 DIAGNOSIS — D751 Secondary polycythemia: Secondary | ICD-10-CM | POA: Diagnosis present

## 2015-02-04 DIAGNOSIS — R079 Chest pain, unspecified: Principal | ICD-10-CM | POA: Diagnosis present

## 2015-02-04 DIAGNOSIS — Z880 Allergy status to penicillin: Secondary | ICD-10-CM | POA: Insufficient documentation

## 2015-02-04 DIAGNOSIS — I25110 Atherosclerotic heart disease of native coronary artery with unstable angina pectoris: Secondary | ICD-10-CM

## 2015-02-04 DIAGNOSIS — M1990 Unspecified osteoarthritis, unspecified site: Secondary | ICD-10-CM | POA: Insufficient documentation

## 2015-02-04 DIAGNOSIS — R61 Generalized hyperhidrosis: Secondary | ICD-10-CM | POA: Diagnosis not present

## 2015-02-04 DIAGNOSIS — J301 Allergic rhinitis due to pollen: Secondary | ICD-10-CM | POA: Diagnosis not present

## 2015-02-04 DIAGNOSIS — J45901 Unspecified asthma with (acute) exacerbation: Secondary | ICD-10-CM | POA: Insufficient documentation

## 2015-02-04 DIAGNOSIS — Z792 Long term (current) use of antibiotics: Secondary | ICD-10-CM | POA: Insufficient documentation

## 2015-02-04 DIAGNOSIS — I10 Essential (primary) hypertension: Secondary | ICD-10-CM | POA: Diagnosis not present

## 2015-02-04 DIAGNOSIS — G4730 Sleep apnea, unspecified: Secondary | ICD-10-CM | POA: Diagnosis not present

## 2015-02-04 DIAGNOSIS — Z7951 Long term (current) use of inhaled steroids: Secondary | ICD-10-CM | POA: Insufficient documentation

## 2015-02-04 DIAGNOSIS — Z79899 Other long term (current) drug therapy: Secondary | ICD-10-CM | POA: Diagnosis not present

## 2015-02-04 DIAGNOSIS — E039 Hypothyroidism, unspecified: Secondary | ICD-10-CM | POA: Diagnosis not present

## 2015-02-04 DIAGNOSIS — K469 Unspecified abdominal hernia without obstruction or gangrene: Secondary | ICD-10-CM | POA: Diagnosis not present

## 2015-02-04 DIAGNOSIS — N200 Calculus of kidney: Secondary | ICD-10-CM | POA: Insufficient documentation

## 2015-02-04 DIAGNOSIS — I2510 Atherosclerotic heart disease of native coronary artery without angina pectoris: Secondary | ICD-10-CM | POA: Diagnosis present

## 2015-02-04 LAB — COMPREHENSIVE METABOLIC PANEL
ALBUMIN: 4.3 g/dL (ref 3.5–5.0)
ALT: 41 U/L (ref 17–63)
AST: 37 U/L (ref 15–41)
Alkaline Phosphatase: 76 U/L (ref 38–126)
Anion gap: 8 (ref 5–15)
BILIRUBIN TOTAL: 1.3 mg/dL — AB (ref 0.3–1.2)
BUN: 13 mg/dL (ref 6–20)
CHLORIDE: 104 mmol/L (ref 101–111)
CO2: 29 mmol/L (ref 22–32)
Calcium: 9.2 mg/dL (ref 8.9–10.3)
Creatinine, Ser: 1.1 mg/dL (ref 0.61–1.24)
GFR calc Af Amer: >60 mL/min (ref >60)
GFR calc non Af Amer: >60 mL/min (ref >60)
GLUCOSE: 119 mg/dL — AB (ref 65–99)
POTASSIUM: 3.7 mmol/L (ref 3.5–5.1)
Sodium: 141 mmol/L (ref 135–145)
TOTAL PROTEIN: 7.3 g/dL (ref 6.5–8.1)

## 2015-02-04 LAB — TSH: TSH: 1.701 u[IU]/mL (ref 0.350–4.500)

## 2015-02-04 LAB — CBC
HCT: 52 % (ref 39.0–52.0)
Hemoglobin: 18.8 g/dL — ABNORMAL HIGH (ref 13.0–17.0)
MCH: 34.2 pg — ABNORMAL HIGH (ref 26.0–34.0)
MCHC: 36.2 g/dL — ABNORMAL HIGH (ref 30.0–36.0)
MCV: 94.7 fL (ref 78.0–100.0)
PLATELETS: 147 10*3/uL — AB (ref 150–400)
RBC: 5.49 MIL/uL (ref 4.22–5.81)
RDW: 13.4 % (ref 11.5–15.5)
WBC: 6.3 10*3/uL (ref 4.0–10.5)

## 2015-02-04 LAB — TROPONIN I
Troponin I: <0.03 ng/mL (ref <0.031)
Troponin I: <0.03 ng/mL (ref <0.031)
Troponin I: <0.03 ng/mL (ref <0.031)

## 2015-02-04 MED ORDER — BUDESONIDE-FORMOTEROL FUMARATE 160-4.5 MCG/ACT IN AERO
1.0000 | INHALATION_SPRAY | Freq: Two times a day (BID) | RESPIRATORY_TRACT | Status: DC
  Administered 2015-02-04 – 2015-02-05 (×2): 1 via RESPIRATORY_TRACT
  Filled 2015-02-04: qty 6

## 2015-02-04 MED ORDER — ASPIRIN 81 MG PO CHEW
324.0000 mg | CHEWABLE_TABLET | Freq: Once | ORAL | Status: AC
  Administered 2015-02-04: 324 mg via ORAL
  Filled 2015-02-04: qty 4

## 2015-02-04 MED ORDER — NITROGLYCERIN 0.4 MG SL SUBL
0.4000 mg | SUBLINGUAL_TABLET | SUBLINGUAL | Status: DC | PRN
  Administered 2015-02-04: 0.4 mg via SUBLINGUAL
  Filled 2015-02-04: qty 1

## 2015-02-04 MED ORDER — ALBUTEROL SULFATE (2.5 MG/3ML) 0.083% IN NEBU: 2.5000 mg | INHALATION_SOLUTION | RESPIRATORY_TRACT | Status: DC | PRN

## 2015-02-04 MED ORDER — GI COCKTAIL ~~LOC~~: 30.0000 mL | Freq: Four times a day (QID) | ORAL | Status: DC | PRN

## 2015-02-04 MED ORDER — BUDESONIDE-FORMOTEROL FUMARATE 160-4.5 MCG/ACT IN AERO
1.0000 | INHALATION_SPRAY | Freq: Two times a day (BID) | RESPIRATORY_TRACT | Status: DC
  Filled 2015-02-04: qty 6

## 2015-02-04 MED ORDER — GABAPENTIN 400 MG PO CAPS
400.0000 mg | ORAL_CAPSULE | Freq: Three times a day (TID) | ORAL | Status: DC
  Administered 2015-02-04 (×2): 400 mg via ORAL
  Filled 2015-02-04 (×3): qty 1

## 2015-02-04 MED ORDER — ONDANSETRON HCL 4 MG/2ML IJ SOLN: 4.0000 mg | Freq: Four times a day (QID) | INTRAMUSCULAR | Status: DC | PRN

## 2015-02-04 MED ORDER — HYDROCHLOROTHIAZIDE 12.5 MG PO CAPS
12.5000 mg | ORAL_CAPSULE | Freq: Every day | ORAL | Status: DC
  Administered 2015-02-04: 12.5 mg via ORAL
  Filled 2015-02-04 (×2): qty 1

## 2015-02-04 MED ORDER — ENOXAPARIN SODIUM 40 MG/0.4ML ~~LOC~~ SOLN
40.0000 mg | SUBCUTANEOUS | Status: DC
  Filled 2015-02-04: qty 0.4

## 2015-02-04 MED ORDER — VALSARTAN-HYDROCHLOROTHIAZIDE 80-12.5 MG PO TABS: 1.0000 | ORAL_TABLET | Freq: Every day | ORAL | Status: DC

## 2015-02-04 MED ORDER — PRAVASTATIN SODIUM 40 MG PO TABS
40.0000 mg | ORAL_TABLET | Freq: Every day | ORAL | Status: DC
  Administered 2015-02-04: 40 mg via ORAL
  Filled 2015-02-04: qty 1

## 2015-02-04 MED ORDER — LEVOTHYROXINE SODIUM 100 MCG PO TABS
100.0000 ug | ORAL_TABLET | Freq: Every day | ORAL | Status: DC
  Filled 2015-02-04: qty 1

## 2015-02-04 MED ORDER — PANTOPRAZOLE SODIUM 40 MG PO TBEC
40.0000 mg | DELAYED_RELEASE_TABLET | Freq: Every day | ORAL | Status: DC
Start: 2015-02-04 — End: 2015-02-05
  Administered 2015-02-04: 40 mg via ORAL
  Filled 2015-02-04 (×2): qty 1

## 2015-02-04 MED ORDER — IRBESARTAN 75 MG PO TABS
75.0000 mg | ORAL_TABLET | Freq: Every day | ORAL | Status: DC
  Administered 2015-02-04: 75 mg via ORAL
  Filled 2015-02-04 (×2): qty 1

## 2015-02-04 MED ORDER — ACETAMINOPHEN 325 MG PO TABS: 650.0000 mg | ORAL_TABLET | ORAL | Status: DC | PRN

## 2015-02-04 MED ORDER — SODIUM CHLORIDE 0.9 % IV SOLN
INTRAVENOUS | Status: DC
Start: 2015-02-04 — End: 2015-02-05
  Administered 2015-02-04: 1000 mL via INTRAVENOUS

## 2015-02-04 NOTE — ED Notes (Signed)
Pt states intermittent CP x 2 weeks, states pressure-type pain at times. Non-radiating.

## 2015-02-04 NOTE — H&P (Signed)
[facility]          History and Physical    PCP:   Sallee Lange, MD   EDP: Julious Oka, MD  Chief Complaint:  Chest Pain  HPI: Patient is a 80 year old man with past medical history significant for asthma, hypertension, hyperlipidemia and GERD who presents to the hospital today with chest pain. He states he has been having chest pain across his entire chest for about the past 2 weeks. It is not brought on by activity, in fact he has been very physically active the past 2 days in his yard carrying trimmers and leaf blower's and has not had any discomfort. He denies any associated shortness of breath, chest pain, he does have GERD although he does not feel like this pain is similar to his GERD. It is not tender to palpation. He decided to come into the hospital today for evaluation. His coronary artery disease risk factors include his age, his gender, history of hypertension, hyperlipidemia, he chews tobacco and has a strong family history. Because of these risk factors we have been asked to admit him for further evaluation and management.  Allergies:   Allergies  Allergen Reactions  . Penicillins Other (See Comments)    Reaction many years ago-unknown reaction      Past Medical History  Diagnosis Date  . Asthma   . Pollen allergies   . Kidney stones   . Hypertension   . Sleep apnea     mild no cpap  . Hernia   . History of stress test 2011  . Hypothyroidism   . GERD (gastroesophageal reflux disease)   . Renal calculus     some removed by Cystoscopy, one passed spontaneously  . H/O hiatal hernia   . Arthritis     "my entire back is gone"  . Polycythemia     Past Surgical History  Procedure Laterality Date  . Back surgery      L-4 , L-5  patient states that it was years ago.  Marland Kitchen Appendectomy      50 years ago  . Colonoscopy      at age 34  . Upper gastrointestinal endoscopy      egd/ed  . Eye surgery      cataracts removed- bilateral-  /w IOL  . Thyroidectomy Right 09/17/2012    Procedure: RIGHT HEMI-THYROIDECTOMY;  Surgeon: Ascencion Dike, MD;  Location: [facility];  Service: ENT;  Laterality: Right;    Prior to Admission medications   Medication Sig Start Date End Date Taking? Authorizing Provider  budesonide-formoterol (SYMBICORT) 160-4.5 MCG/ACT inhaler Inhale 1 puff into the lungs 2 (two) times daily.    Yes Historical Provider, MD  gabapentin (NEURONTIN) 400 MG capsule Take 1 tab in am, 1 tab mid day, 2 tabs at bedtime Patient taking differently: Take 400-800 mg by mouth 3 (three) times daily. Take 1 tab in am, 1 tab mid day, 2 tabs at bedtime 08/11/14  Yes [name], MD  levothyroxine (SYNTHROID, LEVOTHROID) 100 MCG tablet TAKE ONE DROPPERFUL BY MOUTH DAILY BEFORE BREAKFAST. Patient taking differently: TAKE ONE TABLET BY MOUTH DAILY BEFORE BREAKFAST. 02/03/15  Yes Mikey Kirschner, MD  omeprazole (PRILOSEC) 20 MG capsule TAKE (1) CAPSULE BY MOUTH ONCE DAILY. Patient taking differently: Take 20 mg by mouth daily.  08/11/14  Yes [name], MD  pravastatin (PRAVACHOL) 40 MG tablet TAKE ONE TABLET AT  BEDTIME AS DIRECTED. 12/06/14  Yes [name], MD  PRESCRIPTION MEDICATION Allergy shot weekly at Dr. Luan Pulling office   Yes Historical Provider, MD  valsartan-hydrochlorothiazide (DIOVAN-HCT) 80-12.5 MG tablet TAKE ONE TABLET BY MOUTH ONCE DAILY. 02/03/15  Yes Mikey Kirschner, MD  albuterol (PROVENTIL HFA;VENTOLIN HFA) 108 (90 BASE) MCG/ACT inhaler Inhale 2 puffs into the lungs every 4 (four) hours as needed. Patient taking differently: Inhale 2 puffs into the lungs every 4 (four) hours as needed for wheezing or shortness of breath.  11/18/14   Nilda Simmer, NP  fluticasone (FLONASE) 50 MCG/ACT nasal spray USE 2 SPRAYS EACH NOSTRIL EVERY DAY Patient taking differently: Place 2 sprays into both nostrils daily as needed for allergies.  10/07/13   [name], MD  levofloxacin (LEVAQUIN) 500 MG tablet Take 1 tablet (500 mg  total) by mouth daily. Patient not taking: Reported on 02/04/2015 11/18/14   Nilda Simmer, NP    Social History:  reports that he has never smoked. His smokeless tobacco use includes Chew. He reports that he does not drink alcohol or use illicit drugs.  Family History  Problem Relation Age of Onset  . Heart disease Mother   . Heart disease Father   . Heart disease Brother   . Healthy Son     Review of Systems:  Constitutional: Denies fever, chills, diaphoresis, appetite change and fatigue.  HEENT: Denies photophobia, eye pain, redness, hearing loss, ear pain, congestion, sore throat, rhinorrhea, sneezing, mouth sores, trouble swallowing, neck pain, neck stiffness and tinnitus.   Respiratory: Denies SOB, DOE, cough, chest tightness,  and wheezing.   Cardiovascular: Denies  palpitations and leg swelling.  Gastrointestinal: Denies nausea, vomiting, abdominal pain, diarrhea, constipation, blood in stool and abdominal distention.  Genitourinary: Denies dysuria, urgency, frequency, hematuria, flank pain and difficulty urinating.  Endocrine: Denies: hot or cold intolerance, sweats, changes in hair or nails, polyuria, polydipsia. Musculoskeletal: Denies myalgias, back pain, joint swelling, arthralgias and gait problem.  Skin: Denies pallor, rash and wound.  Neurological: Denies dizziness, seizures, syncope, weakness, light-headedness, numbness and headaches.  Hematological: Denies adenopathy. Easy bruising, personal or family bleeding history  Psychiatric/Behavioral: Denies suicidal ideation, mood changes, confusion, nervousness, sleep disturbance and agitation   Physical Exam: Blood pressure 120/76, pulse 50, temperature 97.9 F (36.6 C), temperature source Oral, resp. rate 13, SpO2 100 %. General: Alert, awake, oriented 3, no current distress, sitting in chair and eating lunch. HEENT: Normocephalic, atraumatic, pupils equal round reactive to light, extraocular movements intact, moist  mucous membranes. Neck: Supple, no JVD, no lymphadenopathy, no bruits, no goiter. Cardiovascular: Regular rate and rhythm, no murmurs, rubs or gallops. Lungs: Clear to auscultation bilaterally. Abdomen: Soft, nontender, nondistended, positive bowel sounds. Extremities: No clubbing, cyanosis or edema, positive pulses. Neurologic: Grossly intact and nonfocal. I have seen him ambulating around the room  Labs on Admission:  Results for orders placed or performed during the hospital encounter of 02/04/15 (from the past 48 hour(s))  CBC     Status: Abnormal   Collection Time: 02/04/15 10:05 AM  Result Value Ref Range   WBC 6.3 4.0 - 10.5 K/uL   RBC 5.49 4.22 - 5.81 MIL/uL   Hemoglobin 18.8 (H) 13.0 - 17.0 g/dL   HCT 52.0 39.0 - 52.0 %   MCV 94.7 78.0 - 100.0 fL   MCH 34.2 (H) 26.0 - 34.0 pg   MCHC 36.2 (H) 30.0 - 36.0 g/dL   RDW 13.4 11.5 - 15.5 %   Platelets  147 (L) 150 - 400 K/uL  Troponin I     Status: None   Collection Time: 02/04/15 10:05 AM  Result Value Ref Range   Troponin I <0.03 <0.031 ng/mL    Comment:        NO INDICATION OF MYOCARDIAL INJURY.   Comprehensive metabolic panel     Status: Abnormal   Collection Time: 02/04/15 10:05 AM  Result Value Ref Range   Sodium 141 135 - 145 mmol/L   Potassium 3.7 3.5 - 5.1 mmol/L   Chloride 104 101 - 111 mmol/L   CO2 29 22 - 32 mmol/L   Glucose, Bld 119 (H) 65 - 99 mg/dL   BUN 13 6 - 20 mg/dL   Creatinine, Ser 1.10 0.61 - 1.24 mg/dL   Calcium 9.2 8.9 - 10.3 mg/dL   Total Protein 7.3 6.5 - 8.1 g/dL   Albumin 4.3 3.5 - 5.0 g/dL   AST 37 15 - 41 U/L   ALT 41 17 - 63 U/L   Alkaline Phosphatase 76 38 - 126 U/L   Total Bilirubin 1.3 (H) 0.3 - 1.2 mg/dL   GFR calc non Af Amer >60 >60 mL/min   GFR calc Af Amer >60 >60 mL/min    Comment: (NOTE) The eGFR has been calculated using the CKD EPI equation. This calculation has not been validated in all clinical situations. eGFR's persistently <60 mL/min signify possible Chronic  Kidney Disease.    Anion gap 8 5 - 15    Radiological Exams on Admission: Dg Chest 2 View  02/04/2015  CLINICAL DATA:  Chest pain EXAM: CHEST  2 VIEW COMPARISON:  09/15/2012 chest radiograph FINDINGS: Stable cardiomediastinal silhouette with normal heart size. No pneumothorax. No pleural effusion. Stable hyperinflated lungs. No pulmonary edema. No focal lung consolidation. IMPRESSION: 1. Stable hyperinflated lungs, suggesting nonspecific chronic obstructive lung disease. 2. Otherwise no acute cardiopulmonary disease. Electronically Signed   By: Ilona Sorrel M.D.   On: 02/04/2015 10:42    Assessment/Plan Principal Problem:   Chest pain, rule out acute myocardial infarction Active Problems:   HTN (hypertension), benign   Essential hypertension   CORONARY ATHEROSCLEROSIS NATIVE CORONARY ARTERY   GERD (gastroesophageal reflux disease)   Hypothyroidism   Polycythemia vera (HCC)   Chest pain    Chest pain -Given his risk factors and heart score of 4, will admit to the hospital for the purpose of an acute coronary syndrome rule out. -We'll check troponins every 6 hours and recheck EKG in the morning. -Initial troponin is negative and EKG as interpreted by myself shows sinus rhythm without any acute abnormalities. -If he rules out, can be discharged home in the morning but given his risk factors and his prior history of coronary artery disease (which is unclear as to what workup he has had as this was all done at [facility] over 10 years ago), will recommend close outpatient follow-up with cardiology for consideration of a stress test.  Hypothyroidism -Check TSH and continue home dose of Synthroid.  Hypertension -Well-controlled, continue home medications.  GERD -Continue PPI.  Hyperlipidemia -Continue statin.  Polycythemia vera -Followed as an outpatient by oncology.  DVT prophylaxis -Lovenox  CODE STATUS -Full code   Time Spent on Admission: 85 minutes  HERNANDEZ  ACOSTA,Rafeef Lau [facility] Pager: [phone] 02/04/2015, 2:56 PM

## 2015-02-04 NOTE — Telephone Encounter (Signed)
Patient called office with complaints of upper center chest discomfort and periodic moments of SOB. Patient states it feel like pressure to center of chest with some soreness. Discussed with Dr.Scott Luking in real time. Was told by Dr.Scott Luking to have patient go directly go to ER ASAP and if symptoms worsen to call 911. Patient verbalized understanding and stated he felt well enough to drive himself. Per Dr.Scott Wolfgang [place] I called [facility] ER department to inform Triage nurse of patient and patient's situation.

## 2015-02-04 NOTE — ED Provider Notes (Signed)
CSN: [id number]     Arrival date & time 02/04/15  4081 History  By signing my name below, I, Meriel Pica, attest that this documentation has been prepared under the direction and in the presence of Ripley Fraise, MD. Electronically Signed: Meriel Pica, ED Scribe. 02/04/2015. 10:49 AM.   Chief Complaint  Patient presents with  . Chest Pain   Patient is a 80 y.o. male presenting with chest pain. The history is provided by the patient. No language interpreter was used.  Chest Pain Chest pain location: bilateral upper chest. Pain quality: pressure   Pain radiates to:  Does not radiate Pain radiates to the back: no   Pain severity:  Moderate Onset quality:  Sudden Duration:  6 hours Timing:  Constant Progression:  Unchanged Chronicity:  New Context: at rest   Relieved by:  None tried Worsened by:  Nothing tried Ineffective treatments:  None tried Associated symptoms: diaphoresis and shortness of breath   Associated symptoms: no abdominal pain, no back pain, no fever, no lower extremity edema, no nausea, no numbness, not vomiting and no weakness   Risk factors: hypertension and male sex   Risk factors: no aortic disease, no diabetes mellitus, no high cholesterol, no immobilization, no prior DVT/PE and no smoking    HPI Comments: Thomas [name] is a 80 y.o. male, with a PMhx of asthma, GERD, and HTN, who presents to the Emergency Department complaining of intermittent, non-radiating, bilateral upper chest pain that has been present for 2 weeks that he describes as a pressure and attributes to GERD. The pt was prompted to the ED this morning after the CP woke him up from sleep and has been constant since onset for 6 hours. He associates mild diaphoresis this morning which has resolved. He endorses performing strenuous work over the past 3 days but denies worsening of the intermittent CP at those times. He cannot recall any factors that trigger his CP. Strong family history of  cardiomyopathies. Denies BLE edema, nausea, vomiting, abdominal pain, back pain, or PMhx of MI, CVA, PE or DVT, or DM. Pt is not a current smoker. He reports having had a stress test in the past but has no h/o cardiac catheterization. He has not taken any aspirin or his HTN medication today.   Past Medical History  Diagnosis Date  . Asthma   . Pollen allergies   . Kidney stones   . Hypertension   . Sleep apnea     mild no cpap  . Hernia   . History of stress test 2011  . Hypothyroidism   . GERD (gastroesophageal reflux disease)   . Renal calculus     some removed by Cystoscopy, one passed spontaneously  . H/O hiatal hernia   . Arthritis     "my entire back is gone"  . Polycythemia    Past Surgical History  Procedure Laterality Date  . Back surgery      L-4 , L-5  patient states that it was years ago.  Marland Kitchen Appendectomy      50 years ago  . Colonoscopy      at age 73  . Upper gastrointestinal endoscopy      egd/ed  . Eye surgery      cataracts removed- bilateral- /w IOL  . Thyroidectomy Right 09/17/2012    Procedure: RIGHT HEMI-THYROIDECTOMY;  Surgeon: Ascencion Dike, MD;  Location: [facility] OR;  Service: ENT;  Laterality: Right;   Family History  Problem Relation Age of Onset  .  Heart disease Mother   . Heart disease Father   . Heart disease Brother   . Healthy Son    Social History  Substance Use Topics  . Smoking status: Never Smoker   . Smokeless tobacco: Current User    Types: Chew     Comment: Patient states that he has been chewing for 60 plus years  . Alcohol Use: No     Comment: Patient states that he drinks a bout a case of beer a year    Review of Systems  Constitutional: Positive for diaphoresis. Negative for fever.  Respiratory: Positive for shortness of breath.   Cardiovascular: Positive for chest pain. Negative for leg swelling.  Gastrointestinal: Negative for nausea, vomiting and abdominal pain.  Musculoskeletal: Negative for back pain.  Neurological:  Negative for weakness and numbness.  All other systems reviewed and are negative.  Allergies  Penicillins  Home Medications   Prior to Admission medications   Medication Sig Start Date End Date Taking? Authorizing Provider  albuterol (PROVENTIL HFA;VENTOLIN HFA) 108 (90 BASE) MCG/ACT inhaler Inhale 2 puffs into the lungs every 4 (four) hours as needed. 11/18/14   Nilda Simmer, NP  budesonide-formoterol [facility]) 160-4.5 MCG/ACT inhaler Inhale 1 puff into the lungs 2 (two) times daily.     Historical Provider, MD  fluticasone (FLONASE) 50 MCG/ACT nasal spray USE 2 SPRAYS EACH NOSTRIL EVERY DAY 10/07/13   [name], MD  gabapentin (NEURONTIN) 400 MG capsule Take 1 tab in am, 1 tab mid day, 2 tabs at bedtime 08/11/14   [name], MD  levofloxacin (LEVAQUIN) 500 MG tablet Take 1 tablet (500 mg total) by mouth daily. 11/18/14   Nilda Simmer, NP  levothyroxine (SYNTHROID, LEVOTHROID) 100 MCG tablet TAKE ONE DROPPERFUL BY MOUTH DAILY BEFORE BREAKFAST. 02/03/15   Mikey Kirschner, MD  omeprazole (PRILOSEC) 20 MG capsule TAKE (1) CAPSULE BY MOUTH ONCE DAILY. 08/11/14   [name], MD  pravastatin (PRAVACHOL) 40 MG tablet TAKE ONE TABLET AT BEDTIME AS DIRECTED. 12/06/14   [name], MD  PRESCRIPTION MEDICATION Allergy shot weekly at Dr. Luan Pulling office    Historical Provider, MD  [facility] 80-4.5 MCG/ACT inhaler  09/04/14   Historical Provider, MD  valsartan-hydrochlorothiazide (DIOVAN-HCT) 80-12.5 MG tablet TAKE ONE TABLET BY MOUTH ONCE DAILY. 02/03/15   Mikey Kirschner, MD   BP 144/87 mmHg  Pulse 58  Resp 17  SpO2 100% Physical Exam Nursing notes including past medical history and social history reviewed and considered in documentation CONSTITUTIONAL: Well developed/well nourished HEAD: Normocephalic/atraumatic EYES: EOMI/PERRL ENMT: Mucous membranes moist NECK: supple no meningeal signs SPINE/BACK:entire spine nontender CV: S1/S2 noted, no murmurs/rubs/gallops  noted LUNGS: Lungs are clear to auscultation bilaterally, no apparent distress ABDOMEN: soft, nontender, no rebound or guarding, bowel sounds noted throughout abdomen GU:no cva tenderness NEURO: Pt is awake/alert/appropriate, moves all extremitiesx4.  No facial droop.   EXTREMITIES: pulses normal/equal, full ROM, no edema or calf tenderness SKIN: warm, color normal PSYCH: no abnormalities of mood noted, alert and oriented to situation  ED Course  Procedures  Medications  nitroGLYCERIN (NITROSTAT) SL tablet 0.4 mg (0.4 mg Sublingual Given 02/04/15 1036)  aspirin chewable tablet 324 mg (324 mg Oral Given 02/04/15 1036)    DIAGNOSTIC STUDIES: Oxygen Saturation is 100% on RA, normal by my interpretation.    COORDINATION OF CARE: 10:17 AM Discussed treatment plan with pt at bedside and pt agreed to plan. Will order cardiac workup. Aspirin and NTG ordered.  11:24 AM Pt stable Imaging/labs unremarkable HEART score 4 Will advise admission 12:37 PM Pt improved However due to risk factors, pt will need admitted for observation for ACS rule out I doubt PE/Dissection at this time D/w dr Jerilee Hoh will admit Labs Review Labs Reviewed  CBC - Abnormal; Notable for the following:    Hemoglobin 18.8 (*)    MCH 34.2 (*)    MCHC 36.2 (*)    Platelets 147 (*)    All other components within normal limits  COMPREHENSIVE METABOLIC PANEL - Abnormal; Notable for the following:    Glucose, Bld 119 (*)    Total Bilirubin 1.3 (*)    All other components within normal limits  TROPONIN I    Imaging Review Dg Chest 2 View  02/04/2015  CLINICAL DATA:  Chest pain EXAM: CHEST  2 VIEW COMPARISON:  09/15/2012 chest radiograph FINDINGS: Stable cardiomediastinal silhouette with normal heart size. No pneumothorax. No pleural effusion. Stable hyperinflated lungs. No pulmonary edema. No focal lung consolidation. IMPRESSION: 1. Stable hyperinflated lungs, suggesting nonspecific chronic obstructive lung  disease. 2. Otherwise no acute cardiopulmonary disease. Electronically Signed   By: Ilona Sorrel M.D.   On: 02/04/2015 10:42   I have personally reviewed and evaluated these images and lab results as part of my medical decision-making.   EKG Interpretation   Date/Time:  Friday February 04 2015 10:00:51 EDT Ventricular Rate:  61 PR Interval:  164 QRS Duration: 94 QT Interval:  431 QTC Calculation: 434 R Axis:   63 Text Interpretation:  Sinus rhythm Nonspecific T wave abnormality No  significant change since last tracing Confirmed by Christy Gentles  MD, Elenore Rota  [phone]) on 02/04/2015 10:20:45 AM      MDM   Final diagnoses:  Chest pain, rule out acute myocardial infarction    Nursing notes including past medical history and social history reviewed and considered in documentation Labs/vital reviewed myself and considered during evaluation xrays/imaging reviewed by myself and considered during evaluation   I, Sharyon Cable, personally performed the services described in this documentation. All medical record entries made by the scribe were at my direction and in my presence.  I have reviewed the chart and  agree that the record reflects my personal performance and is accurate and complete. Sharyon Cable.  02/04/2015. 11:23 AM.      Ripley Fraise, MD 02/04/15 1239

## 2015-02-05 DIAGNOSIS — Z79899 Other long term (current) drug therapy: Secondary | ICD-10-CM | POA: Diagnosis not present

## 2015-02-05 DIAGNOSIS — N200 Calculus of kidney: Secondary | ICD-10-CM | POA: Diagnosis not present

## 2015-02-05 DIAGNOSIS — K469 Unspecified abdominal hernia without obstruction or gangrene: Secondary | ICD-10-CM | POA: Diagnosis not present

## 2015-02-05 DIAGNOSIS — I10 Essential (primary) hypertension: Secondary | ICD-10-CM | POA: Diagnosis not present

## 2015-02-05 DIAGNOSIS — M1990 Unspecified osteoarthritis, unspecified site: Secondary | ICD-10-CM | POA: Diagnosis not present

## 2015-02-05 DIAGNOSIS — Z7951 Long term (current) use of inhaled steroids: Secondary | ICD-10-CM | POA: Diagnosis not present

## 2015-02-05 DIAGNOSIS — D751 Secondary polycythemia: Secondary | ICD-10-CM | POA: Diagnosis not present

## 2015-02-05 DIAGNOSIS — K219 Gastro-esophageal reflux disease without esophagitis: Secondary | ICD-10-CM | POA: Diagnosis not present

## 2015-02-05 DIAGNOSIS — G4730 Sleep apnea, unspecified: Secondary | ICD-10-CM | POA: Diagnosis not present

## 2015-02-05 DIAGNOSIS — J45901 Unspecified asthma with (acute) exacerbation: Secondary | ICD-10-CM | POA: Diagnosis not present

## 2015-02-05 DIAGNOSIS — J301 Allergic rhinitis due to pollen: Secondary | ICD-10-CM | POA: Diagnosis not present

## 2015-02-05 DIAGNOSIS — R079 Chest pain, unspecified: Secondary | ICD-10-CM | POA: Diagnosis not present

## 2015-02-05 DIAGNOSIS — Z792 Long term (current) use of antibiotics: Secondary | ICD-10-CM | POA: Diagnosis not present

## 2015-02-05 DIAGNOSIS — E039 Hypothyroidism, unspecified: Secondary | ICD-10-CM | POA: Diagnosis not present

## 2015-02-05 DIAGNOSIS — Z880 Allergy status to penicillin: Secondary | ICD-10-CM | POA: Diagnosis not present

## 2015-02-05 DIAGNOSIS — R61 Generalized hyperhidrosis: Secondary | ICD-10-CM | POA: Diagnosis not present

## 2015-02-05 NOTE — Discharge Summary (Signed)
Physician Discharge Summary  Thomas [name]:[id number] DOB: [birth year] DOA: 02/04/2015  PCP: Sallee Lange, MD  Admit date: 02/04/2015 Discharge date: 02/05/2015  Time spent: 25 minutes  Recommendations for Outpatient Follow-up:  Follow up with PCP on 02/16/15 as scheduled.   Follow up with cardiology for consideration of outpatient stress test.    Discharge Diagnoses:  Principal Problem:   Chest pain, rule out acute myocardial infarction Active Problems:   HTN (hypertension), benign   Essential hypertension   CORONARY ATHEROSCLEROSIS NATIVE CORONARY ARTERY   GERD (gastroesophageal reflux disease)   Hypothyroidism   Polycythemia vera (HCC)   Chest pain   Discharge Condition: Improved  Filed Weights   02/04/15 1517  Weight: 87.7 kg (193 lb 5.5 oz)    History of present illness:  80 year old male with past medical history significant for asthma, hypertension, hyperlipidemia and GERD  presented to the hospital with chest pain. He stated he has been having chest pain across his entire chest for about the past 2 weeks. It is not brought on by activity, in fact he has been very physically active the past 2 days in his yard carrying trimmers and leaf blower's and has not had any discomfort. He denies any associated shortness of breath. He does have GERD although he does not feel like this pain is similar to his hx of GERD.  Because of his risk factors he was admitted for further evaluation and management.  Hospital Course:   Chest pain - Improved but still present - Serial troponins negative. Repeat EKG as interpreted by myself shows sinus bradycardia without any acute abnormalities. - Given risk factors, will recommend close outpatient follow-up with cardiology for consideration of a stress test.  Hypothyroidism -TSH wnl.  - Continue home dose of Synthroid.  Hypertension -Well-controlled, continue home medications.  GERD -Continue  PPI.  Hyperlipidemia -Continue statin.  Polycythemia vera -Followed as an outpatient by oncology.  Procedures:  none  Consultations:  none  Discharge Instructions      Discharge Instructions    Diet - low sodium heart healthy    Complete by:  As directed      Increase activity slowly    Complete by:  As directed             Medication List    STOP taking these medications        levofloxacin 500 MG tablet  Commonly known as:  LEVAQUIN     PRESCRIPTION MEDICATION      TAKE these medications        albuterol 108 (90 BASE) MCG/ACT inhaler  Commonly known as:  PROVENTIL HFA;VENTOLIN HFA  Inhale 2 puffs into the lungs every 4 (four) hours as needed.     budesonide-formoterol 160-4.5 MCG/ACT inhaler  Commonly known as:  SYMBICORT  Inhale 1 puff into the lungs 2 (two) times daily.     fluticasone 50 MCG/ACT nasal spray  Commonly known as:  FLONASE  USE 2 SPRAYS EACH NOSTRIL EVERY DAY     gabapentin 400 MG capsule  Commonly known as:  NEURONTIN  Take 1 tab in am, 1 tab mid day, 2 tabs at bedtime     levothyroxine 100 MCG tablet  Commonly known as:  SYNTHROID, LEVOTHROID  TAKE ONE DROPPERFUL BY MOUTH DAILY BEFORE BREAKFAST.     omeprazole 20 MG capsule  Commonly known as:  PRILOSEC  TAKE (1) CAPSULE BY MOUTH ONCE DAILY.     pravastatin 40 MG tablet  Commonly  known as:  PRAVACHOL  TAKE ONE TABLET AT BEDTIME AS DIRECTED.     valsartan-hydrochlorothiazide 80-12.5 MG tablet  Commonly known as:  DIOVAN-HCT  TAKE ONE TABLET BY MOUTH ONCE DAILY.       Allergies  Allergen Reactions  . Penicillins Other (See Comments)    Reaction many years ago-unknown reaction   Follow-up Information    Follow up with Sallee Lange, MD.   Specialty:  Family Medicine   Why:  as scheduled for 11/2   Contact information:   [address] [place] [address] [phone]        The results of significant diagnostics from this hospitalization (including  imaging, microbiology, ancillary and laboratory) are listed below for reference.    Significant Diagnostic Studies: Dg Chest 2 View  02/04/2015  CLINICAL DATA:  Chest pain EXAM: CHEST  2 VIEW COMPARISON:  09/15/2012 chest radiograph FINDINGS: Stable cardiomediastinal silhouette with normal heart size. No pneumothorax. No pleural effusion. Stable hyperinflated lungs. No pulmonary edema. No focal lung consolidation. IMPRESSION: 1. Stable hyperinflated lungs, suggesting nonspecific chronic obstructive lung disease. 2. Otherwise no acute cardiopulmonary disease. Electronically Signed   By: Ilona Sorrel M.D.   On: 02/04/2015 10:42    Labs: Basic Metabolic Panel:  Recent Labs Lab 02/04/15 1005  NA 141  K 3.7  CL 104  CO2 29  GLUCOSE 119*  BUN 13  CREATININE 1.10  CALCIUM 9.2   Liver Function Tests:  Recent Labs Lab 02/04/15 1005  AST 37  ALT 41  ALKPHOS 76  BILITOT 1.3*  PROT 7.3  ALBUMIN 4.3   CBC:  Recent Labs Lab 02/04/15 1005  WBC 6.3  HGB 18.8*  HCT 52.0  MCV 94.7  PLT 147*   Cardiac Enzymes:  Recent Labs Lab 02/04/15 1005 02/04/15 1456 02/04/15 2055  TROPONINI <0.03 <0.03 <0.03      Signed: Domingo Mend, MD  [facility] Pager: [phone] 02/05/2015, 9:43 AM   By signing my name below, I, Rosalie Doctor, attest that this documentation has been prepared under the direction and in the presence of Domingo Mend, MD.  Electronically Signed: Rosalie Doctor, Scribe. 02/05/2015 10:07am  I have reviewed the above documentation for accuracy and completeness, and I agree with the above.  Domingo Mend, MD [facility] Pager: [phone]

## 2015-02-09 ENCOUNTER — Encounter: Payer: Medicare Other | Admitting: Family Medicine

## 2015-02-16 ENCOUNTER — Ambulatory Visit (INDEPENDENT_AMBULATORY_CARE_PROVIDER_SITE_OTHER): Payer: Medicare Other | Admitting: Family Medicine

## 2015-02-16 ENCOUNTER — Encounter: Payer: Self-pay | Admitting: Family Medicine

## 2015-02-16 VITALS — BP 128/78 | Ht 68.0 in | Wt 195.0 lb

## 2015-02-16 DIAGNOSIS — Z79899 Other long term (current) drug therapy: Secondary | ICD-10-CM | POA: Diagnosis not present

## 2015-02-16 DIAGNOSIS — Z0000 Encounter for general adult medical examination without abnormal findings: Secondary | ICD-10-CM | POA: Diagnosis not present

## 2015-02-16 DIAGNOSIS — I10 Essential (primary) hypertension: Secondary | ICD-10-CM | POA: Diagnosis not present

## 2015-02-16 DIAGNOSIS — E785 Hyperlipidemia, unspecified: Secondary | ICD-10-CM

## 2015-02-16 NOTE — Progress Notes (Signed)
   Subjective:    Patient ID: Thomas [name], male    DOB: 12-07-1934, 80 y.o.   MRN: [id number]  HPI AWV- Annual Wellness Visit  The patient was seen for their annual wellness visit. The patient's past medical history, surgical history, and family history were reviewed. Pertinent vaccines were reviewed ( tetanus, pneumonia, shingles, flu) The patient's medication list was reviewed and updated.  The height and weight were entered. The patient's current BMI is:29.65  Cognitive screening was completed. Outcome of Mini - Cog: pass  Falls within the past 6 months:none  Current tobacco usage: none (All patients who use tobacco were given written and verbal information on quitting)  Recent listing of emergency department/hospitalizations over the past year were reviewed.  current specialist the patient sees on a regular basis: no   Medicare annual wellness visit patient questionnaire was reviewed.  A written screening schedule for the patient for the next 5-10 years was given. Appropriate discussion of followup regarding next visit was discussed.  We will go ahead and check lipid liver profile. Continue medication. Patient does take blood pressure medicine on a regular basis. No problems or concerns per patient. Recent hospitalization for chest pain but none cardiac per patient.   Review of Systems Patient denies any dyspnea urinary frequency chest tightness pressure pain denies abdominal pain denies sweats chills. States energy level overall doing fairly well. Denies headaches    Objective:   Physical Exam Patient wears hearing aids no neck masses felt sinus nontender lungs clear hearts regular abdomen soft prostate exam normal foot exam normal  Blood pressure under good control     Assessment & Plan:   annual exam safety dietary discussed. Patient up-to-date on immunizations. Shingles vaccine recommended. Colonoscopy next year.    recent chest pain I really doubt that this  is coronary artery disease he has reoccurrence of chest discomfort he will need to follow-up referral to cardiology. Patient has been working vigorously for the past few days without any chest pain or pressure or shortness of breath   hyperlipidemia check lipid liver profile. Continue cholesterol medicine.   Blood pressure good control continue current medication

## 2015-02-16 NOTE — Patient Instructions (Signed)
Aspirin and Your Heart  Aspirin is a medicine that affects the way blood clots. Aspirin can be used to help reduce the risk of blood clots, heart attacks, and other heart-related problems.  SHOULD I TAKE ASPIRIN? Your health care provider will help you determine whether it is safe and beneficial for you to take aspirin daily. Taking aspirin daily may be beneficial if you:  Have had a heart attack or chest pain.  Have undergone open heart surgery such as coronary artery bypass surgery (CABG).  Have had coronary angioplasty.  Have experienced a stroke or transient ischemic attack (TIA).  Have peripheral vascular disease (PVD).  Have chronic heart rhythm problems such as atrial fibrillation. ARE THERE ANY RISKS OF TAKING ASPIRIN DAILY? Daily use of aspirin can increase your risk of side effects. Some of these include:  Bleeding. Bleeding problems can be minor or serious. An example of a minor problem is a cut that does not stop bleeding. An example of a more serious problem is stomach bleeding or bleeding into the brain. Your risk of bleeding is increased if you are also taking non-steroidal anti-inflammatory medicine (NSAIDs).  Increased bruising.  Upset stomach.  An allergic reaction. People who have nasal polyps have an increased risk of developing an aspirin allergy. WHAT ARE SOME GUIDELINES I SHOULD FOLLOW WHEN TAKING ASPIRIN?   Take aspirin only as directed by your health care provider. Make sure you understand how much you should take and what form you should take. The two forms of aspirin are:  Non-enteric-coated. This type of aspirin does not have a coating and is absorbed quickly. Non-enteric-coated aspirin is usually recommended for people with chest pain. This type of aspirin also comes in a chewable form.  Enteric-coated. This type of aspirin has a special coating that releases the medicine very slowly. Enteric-coated aspirin causes less stomach upset than non-enteric-coated  aspirin. This type of aspirin should not be chewed or crushed.  Drink alcohol in moderation. Drinking alcohol increases your risk of bleeding. WHEN SHOULD I SEEK MEDICAL CARE?   You have unusual bleeding or bruising.  You have stomach pain.  You have an allergic reaction. Symptoms of an allergic reaction include:  Hives.  Itchy skin.  Swelling of the lips, tongue, or face.  You have ringing in your ears. WHEN SHOULD I SEEK IMMEDIATE MEDICAL CARE?   Your bowel movements are bloody, dark red, or black in color.  You vomit or cough up blood.  You have blood in your urine.  You cough, wheeze, or feel short of breath. If you have any of the following symptoms, this is an emergency. Do not wait to see if the pain will go away. Get medical help at once. Call your local emergency services (911 in the [place] Do not drive yourself to the hospital.  You have severe chest pain, especially if the pain is crushing or pressure-like and spreads to the arms, back, neck, or jaw.  You have stroke-like symptoms, such as:   Loss of vision.   Difficulty talking.   Numbness or weakness on one side of your body.   Numbness or weakness in your arm or leg.   Not thinking clearly or feeling confused.    This information is not intended to replace advice given to you by your health care provider. Make sure you discuss any questions you have with your health care provider.   Document Released: 03/15/2008 Document Revised: 04/23/2014 Document Reviewed: 07/08/2013 Elsevier Interactive Patient Education 2016 [employer]  

## 2015-03-01 ENCOUNTER — Encounter: Payer: Self-pay | Admitting: Family Medicine

## 2015-03-01 ENCOUNTER — Other Ambulatory Visit: Payer: Self-pay

## 2015-03-01 DIAGNOSIS — Z0000 Encounter for general adult medical examination without abnormal findings: Secondary | ICD-10-CM

## 2015-03-01 LAB — LIPID PANEL
CHOL/HDL RATIO: 3.9 ratio (ref 0.0–5.0)
Cholesterol, Total: 156 mg/dL (ref 100–199)
HDL: 40 mg/dL (ref >39)
LDL CALC: 81 mg/dL (ref 0–99)
Triglycerides: 177 mg/dL — ABNORMAL HIGH (ref 0–149)
VLDL Cholesterol Cal: 35 mg/dL (ref 5–40)

## 2015-03-01 LAB — HEPATIC FUNCTION PANEL
ALBUMIN: 4.5 g/dL (ref 3.5–4.8)
ALT: 51 IU/L — ABNORMAL HIGH (ref 0–44)
AST: 38 IU/L (ref 0–40)
Alkaline Phosphatase: 87 IU/L (ref 39–117)
BILIRUBIN TOTAL: 0.8 mg/dL (ref 0.0–1.2)
Bilirubin, Direct: 0.24 mg/dL (ref 0.00–0.40)
Total Protein: 6.9 g/dL (ref 6.0–8.5)

## 2015-03-01 LAB — POC HEMOCCULT BLD/STL (HOME/3-CARD/SCREEN)
FECAL OCCULT BLD: NEGATIVE
FECAL OCCULT BLD: NEGATIVE
Fecal Occult Blood, POC: NEGATIVE

## 2015-03-01 LAB — BASIC METABOLIC PANEL
BUN/Creatinine Ratio: 10 (ref 10–22)
BUN: 10 mg/dL (ref 8–27)
CALCIUM: 9.3 mg/dL (ref 8.6–10.2)
CHLORIDE: 97 mmol/L (ref 97–106)
CO2: 26 mmol/L (ref 18–29)
Creatinine, Ser: 0.96 mg/dL (ref 0.76–1.27)
GFR, EST AFRICAN AMERICAN: 87 mL/min/{1.73_m2} (ref >59)
GFR, EST NON AFRICAN AMERICAN: 75 mL/min/{1.73_m2} (ref >59)
Glucose: 82 mg/dL (ref 65–99)
Potassium: 4.4 mmol/L (ref 3.5–5.2)
Sodium: 142 mmol/L (ref 136–144)

## 2015-03-24 ENCOUNTER — Other Ambulatory Visit (HOSPITAL_COMMUNITY): Payer: Self-pay

## 2015-04-01 ENCOUNTER — Encounter: Payer: Self-pay | Admitting: Nurse Practitioner

## 2015-04-01 ENCOUNTER — Ambulatory Visit (INDEPENDENT_AMBULATORY_CARE_PROVIDER_SITE_OTHER): Payer: Medicare Other | Admitting: Nurse Practitioner

## 2015-04-01 VITALS — BP 136/88 | Temp 98.4°F | Wt 195.0 lb

## 2015-04-01 DIAGNOSIS — J329 Chronic sinusitis, unspecified: Secondary | ICD-10-CM | POA: Diagnosis not present

## 2015-04-01 MED ORDER — AZITHROMYCIN 250 MG PO TABS: ORAL_TABLET | ORAL | Status: DC

## 2015-04-01 NOTE — Progress Notes (Signed)
[place] at [facility] Progress Note  Patient Care Team: [name], MD as PCP - General (Family Medicine)  CHIEF COMPLAINTS/PURPOSE OF CONSULTATION:  Polycythemia, JAK-2 negative   HISTORY OF PRESENTING ILLNESS:  Thomas [name] 80 y.o. male is here because of JAK-2 negative polycythemia. He has never smoked but used chewing tobacco most of his life.  He is present alone today. 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 He has never been hospitalized for an asthma exacerbation.  He has previously been tested for sleep anea year ago, was told it was not bad enough to treat.  He snores. Wakes up feeling well rested most days, does take a mid-day nap however.  Pt states that he has sinus infection.  About twice a year he gets those. Bowels good. Denies chest pain.  Just one episodes couple months ago, checked out here and cleared. Flu shot but not at this clinic  For christmas spending time with children and son. No other complaints or concerns today.   MEDICAL HISTORY:  Past Medical History  Diagnosis Date  . Asthma   . Pollen allergies   . Kidney stones   . Hypertension   . Sleep apnea     mild no cpap  . Hernia   . History of stress test 2011  . Hypothyroidism   . GERD (gastroesophageal reflux disease)   . Renal calculus     some removed by Cystoscopy, one passed spontaneously  . H/O hiatal hernia   . Arthritis     "my entire back is gone"  . Polycythemia     SURGICAL HISTORY: Past Surgical History  Procedure Laterality Date  . Back surgery      L-4 , L-5  patient states that it was years ago.  Marland Kitchen Appendectomy      50 years ago  . Colonoscopy      at age 89  . Upper gastrointestinal endoscopy     egd/ed  . Eye surgery      cataracts removed- bilateral- /w IOL  . Thyroidectomy Right 09/17/2012    Procedure: RIGHT HEMI-THYROIDECTOMY;  Surgeon: Ascencion Dike, MD;  Location: [facility] OR;  Service: ENT;  Laterality: Right;    SOCIAL HISTORY: Social History   Social History  . Marital Status: Widowed    Spouse Name: N/A  . Number of Children: N/A  . Years of Education: N/A   Occupational History  . Not on file.   Social History Main Topics  . Smoking status: Never Smoker   . Smokeless tobacco: Current User    Types: Chew     Comment: Patient states that he has been chewing for 60 plus years  . Alcohol Use: No     Comment: Patient states that he drinks a bout a case of beer a year  . Drug Use: No  . Sexual Activity: No   Other Topics Concern  . Not on file   Social History Narrative    FAMILY HISTORY: Family History  Problem Relation Age of Onset  . Heart disease Mother   . Heart disease Father   . Heart disease Brother   . Healthy Son    indicated that his  mother is deceased. He indicated that his father is deceased. He indicated that his sister is deceased. He indicated that his brother is deceased. He indicated that his son is alive.   ALLERGIES:  is allergic to penicillins.  MEDICATIONS:  Current Outpatient Prescriptions  Medication Sig Dispense Refill  . albuterol (PROVENTIL HFA;VENTOLIN HFA) 108 (90 BASE) MCG/ACT inhaler Inhale 2 puffs into the lungs every 4 (four) hours as needed. (Patient taking differently: Inhale 2 puffs into the lungs every 4 (four) hours as needed for wheezing or shortness of breath. ) 1 Inhaler 0  . azithromycin (ZITHROMAX Z-PAK) 250 MG tablet Take 2 tablets (500 mg) on  Day 1,  followed by 1 tablet (250 mg) once daily on Days 2 through 5. 6 each 0  . budesonide-formoterol (SYMBICORT) 160-4.5 MCG/ACT inhaler Inhale 1 puff into the lungs 2 (two) times daily.     Marland Kitchen gabapentin (NEURONTIN) 400 MG capsule Take 1 tab in am, 1 tab mid day, 2 tabs at  bedtime (Patient taking differently: Take 400-800 mg by mouth 3 (three) times daily. Take 1 tab in am, 1 tab mid day, 2 tabs at bedtime) 120 capsule 5  . levothyroxine (SYNTHROID, LEVOTHROID) 100 MCG tablet TAKE ONE DROPPERFUL BY MOUTH DAILY BEFORE BREAKFAST. (Patient taking differently: TAKE ONE TABLET BY MOUTH DAILY BEFORE BREAKFAST.) 90 tablet 0  . omeprazole (PRILOSEC) 20 MG capsule TAKE (1) CAPSULE BY MOUTH ONCE DAILY. (Patient taking differently: Take 20 mg by mouth daily. ) 90 capsule 1  . pravastatin (PRAVACHOL) 40 MG tablet TAKE ONE TABLET AT BEDTIME AS DIRECTED. 90 tablet 0  . valsartan-hydrochlorothiazide (DIOVAN-HCT) 80-12.5 MG tablet TAKE ONE TABLET BY MOUTH ONCE DAILY. 90 tablet 0  . azithromycin (ZITHROMAX) 250 MG tablet Take 1 tablet  Daily til finished 4 each 0  . fluticasone (FLONASE) 50 MCG/ACT nasal spray INHALE 1 SPRAY INTO EACH NOSTRIL TWICE DAILY. 16 g 5   No current facility-administered medications for this visit.    Review of Systems  Constitutional: Negative for fever, chills, weight loss and malaise/fatigue.  HENT: Negative for congestion, hearing loss, nosebleeds, sore throat and tinnitus.   Eyes: Negative for blurred vision, double vision, pain and discharge.  Respiratory: Negative for cough, hemoptysis, sputum production, shortness of breath and wheezing.        Rare wheezing or SOB  Cardiovascular: Negative for chest pain, palpitations, claudication, leg swelling and PND.  Gastrointestinal: Negative for heartburn, nausea, vomiting, abdominal pain, diarrhea, constipation, blood in stool and melena.  Genitourinary: Negative for dysuria, urgency, frequency and hematuria.  Musculoskeletal: Negative for myalgias, joint pain and falls.  Skin: Negative for itching and rash.  Neurological: Negative for dizziness, tingling, tremors, sensory change, speech change, focal weakness, seizures, loss of consciousness, weakness and headaches.  Endo/Heme/Allergies: Does not  bruise/bleed easily.  Psychiatric/Behavioral: Negative for depression, suicidal ideas, memory loss and substance abuse. The patient is not nervous/anxious and does not have insomnia.    14 point ROS was done and is otherwise as detailed above or in HPI   PHYSICAL EXAMINATION: ECOG PERFORMANCE STATUS: 0 - Asymptomatic  Filed Vitals:   04/05/15 1302  BP: 143/84  Pulse: 66  Temp: 98.3 F (36.8 C)  Resp: 20   Filed Weights   04/05/15 1302  Weight: 191 lb 12.8 oz (87 kg)    Physical Exam  Constitutional: He is oriented to person, place, and time and well-developed, well-nourished, and in no distress.  Appears younger than stated age  HENT:  Head:  Normocephalic and atraumatic.  Nose: Nose normal.  Mouth/Throat: Oropharynx is clear and moist. No oropharyngeal exudate.  Eyes: Conjunctivae and EOM are normal. Pupils are equal, round, and reactive to light. Right eye exhibits no discharge. Left eye exhibits no discharge. No scleral icterus.  Neck: Normal range of motion. Neck supple. No tracheal deviation present. No thyromegaly present.  Cardiovascular: Normal rate, regular rhythm and normal heart sounds.  Exam reveals no gallop and no friction rub.   No murmur heard. Pulmonary/Chest: Effort normal and breath sounds normal. He has no wheezes. He has no rales.  Abdominal: Soft. Bowel sounds are normal. He exhibits no distension and no mass. There is no tenderness. There is no rebound and no guarding.  Musculoskeletal: Normal range of motion. He exhibits no edema.  Lymphadenopathy:    He has no cervical adenopathy.  Neurological: He is alert and oriented to person, place, and time. He has normal reflexes. No cranial nerve deficit. Gait normal. Coordination normal.  Skin: Skin is warm and dry. No rash noted.  Psychiatric: Mood, memory, affect and judgment normal.  Nursing note and vitals reviewed.    LABORATORY DATA:  I have reviewed the data as listed Lab Results  Component Value  Date   WBC 5.1 04/05/2015   HGB 18.0* 04/05/2015   HCT 50.8 04/05/2015   MCV 95.1 04/05/2015   PLT 134* 04/05/2015     ASSESSMENT & PLAN:  Polycythemia JAK-2 V617 F mutation negative Asthma No history of cigarette use Thrombocytopenia  He has a history of asthma but does not appear to have her have been limited by his disease. He is very physically active. He only uses a Symbicort inhaler. He has never been hospitalized for an asthma exacerbation. He has never smoked. He has been tested for sleep apnea and was told it was negative. At this point I am not completely convinced his polycythemia is secondary. An erythropoietin level was checked last year and was 9.4 mIU/ml.  I discussed additional genetic testing with the patient and he is open to this. We will arrange for this and I advised him based upon the results additional recommendations will follow. He may need a bone marrow biopsy but we will address this at future follow-up. Remainder of MPD evaluation has been added to his next follow-up.  Platelet count today was slightly decreased.  He is agreeable to repeating labs in several months for follow-up. He has recently been on an antibiotic. Exam completed by Dr Whitney Muse today  Please call the clinic if you have any questions or concerns   All questions were answered. The patient knows to call the clinic with any problems, questions or concerns.   This note was electronically signed.   Kelby Fam. [place]

## 2015-04-01 NOTE — Patient Instructions (Signed)
Take Align daily for bowels

## 2015-04-05 ENCOUNTER — Encounter: Payer: Self-pay | Admitting: Nurse Practitioner

## 2015-04-05 ENCOUNTER — Encounter (HOSPITAL_BASED_OUTPATIENT_CLINIC_OR_DEPARTMENT_OTHER): Payer: Medicare Other

## 2015-04-05 ENCOUNTER — Encounter (HOSPITAL_COMMUNITY): Payer: Self-pay | Admitting: Hematology & Oncology

## 2015-04-05 ENCOUNTER — Encounter (HOSPITAL_COMMUNITY): Payer: Medicare Other | Attending: Hematology & Oncology | Admitting: Hematology & Oncology

## 2015-04-05 VITALS — BP 143/84 | HR 66 | Temp 98.3°F | Resp 20 | Wt 191.8 lb

## 2015-04-05 DIAGNOSIS — D45 Polycythemia vera: Secondary | ICD-10-CM | POA: Diagnosis not present

## 2015-04-05 DIAGNOSIS — D751 Secondary polycythemia: Secondary | ICD-10-CM | POA: Diagnosis not present

## 2015-04-05 DIAGNOSIS — J45909 Unspecified asthma, uncomplicated: Secondary | ICD-10-CM

## 2015-04-05 LAB — CBC
HCT: 50.8 % (ref 39.0–52.0)
HEMOGLOBIN: 18 g/dL — AB (ref 13.0–17.0)
MCH: 33.7 pg (ref 26.0–34.0)
MCHC: 35.4 g/dL (ref 30.0–36.0)
MCV: 95.1 fL (ref 78.0–100.0)
PLATELETS: 134 10*3/uL — AB (ref 150–400)
RBC: 5.34 MIL/uL (ref 4.22–5.81)
RDW: 13.5 % (ref 11.5–15.5)
WBC: 5.1 10*3/uL (ref 4.0–10.5)

## 2015-04-05 MED ORDER — AZITHROMYCIN 250 MG PO TABS: ORAL_TABLET | ORAL | Status: DC

## 2015-04-05 NOTE — Patient Instructions (Addendum)
[place] at [facility] Discharge Instructions  RECOMMENDATIONS MADE BY THE CONSULTANT AND ANY TEST RESULTS WILL BE SENT TO YOUR REFERRING PHYSICIAN.   Exam completed by Dr Whitney Muse today Return to see doctor in 3 mont hs due to platelets being low Labs in 3 months (antibiodies added to next labs) Zithromax 4 more days called into pharmacy Please call the clinic if you have any questions or concerns     Thank you for choosing [place] at [facility] to provide your oncology and hematology care.  To afford each patient quality time with our provider, please arrive at least 15 minutes before your scheduled appointment time.    You need to re-schedule your appointment should you arrive 10 or more minutes late.  We strive to give you quality time with our providers, and arriving late affects you and other patients whose appointments are after yours.  Also, if you no show three or more times for appointments you may be dismissed from the clinic at the providers discretion.     Again, thank you for choosing [facility].  Our hope is that these requests will decrease the amount of time that you wait before being seen by our physicians.       _____________________________________________________________  Should you have questions after your visit to [facility], please contact our office at [phone] between the hours of 8:30 a.m. and 4:30 p.m.  Voicemails left after 4:30 p.m. will not be returned until the following business day.  For prescription refill requests, have your pharmacy contact our office.

## 2015-04-05 NOTE — Progress Notes (Signed)
Subjective:  Presents for c/o sinus congestion x 1 week. No fever. Frontal area headache. Cough producing green sputum. Nausea, no vomiting. Slight diarrhea today, no abd pain. No ear pain or wheezing. Taking fluids well. Voiding nl.   Objective:   BP 136/88 mmHg  Temp(Src) 98.4 F (36.9 C) (Oral)  Wt 195 lb (88.451 kg) NAD. Alert, oriented. TMs clear effusion. Pharynx erythema with green PND noted. Neck supple with mild anterior adenopathy. Lungs clear. Heart RRR. Abdomen soft, non tender.   Assessment: Rhinosinusitis  Plan:  Meds ordered this encounter  Medications  . azithromycin (ZITHROMAX Z-PAK) 250 MG tablet    Sig: Take 2 tablets (500 mg) on  Day 1,  followed by 1 tablet (250 mg) once daily on Days 2 through 5.    Dispense:  6 each    Refill:  0    Order Specific Question:  Supervising Provider    Answer:  Mikey Kirschner [[id number]]   OTC meds as directed. Call back if worsens or persists.

## 2015-04-05 NOTE — Progress Notes (Signed)
..  [name]'s reason for visit today are for labs as scheduled per MD orders.  Venipuncture performed with a 23 gauge butterfly needle to R Antecubital.  [name] tolerated venipuncture well and without incident; questions were answered and patient was discharged.

## 2015-04-18 ENCOUNTER — Other Ambulatory Visit: Payer: Self-pay | Admitting: Family Medicine

## 2015-04-19 DIAGNOSIS — J3089 Other allergic rhinitis: Secondary | ICD-10-CM | POA: Diagnosis not present

## 2015-04-28 IMAGING — US US THYROID BIOPSY
1 series · 13 of 13 positions shown · non-contrast
Comparison: none

CLINICAL DATA: Right thyroid mass

ULTRASOUND GUIDED FINE NEEDLE ASPIRATION BIOPSY OF THYROID NODULE:
TECHNIQUE: Written informed consent for procedure obtained after discussion of
procedure and risks.
Time-out protocol performed.
Right lobe thyroid mass localized by ultrasound.
Mass measures 2.3 x 2.3 x 1.7 cm.

[Series 1: us thyroid biopsy · 0.07mm/px · 13 acquisitions, 13 frames shown]
[im 1/13]
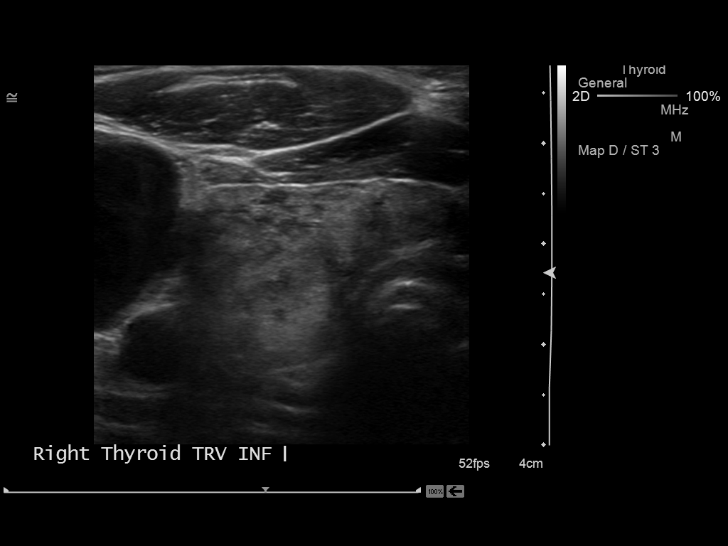
[im 2/13]
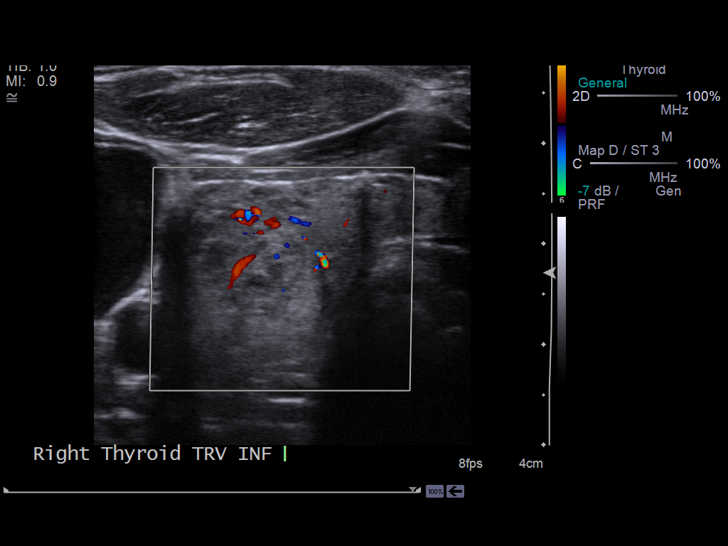
[im 3/13]
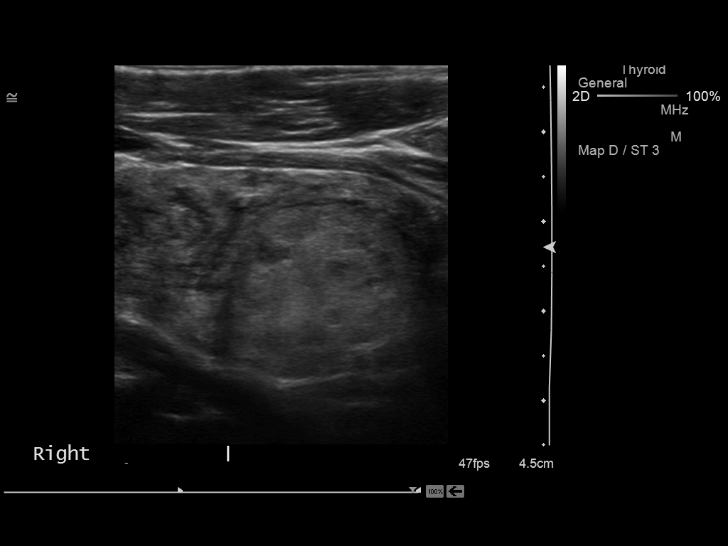
[im 4/13]
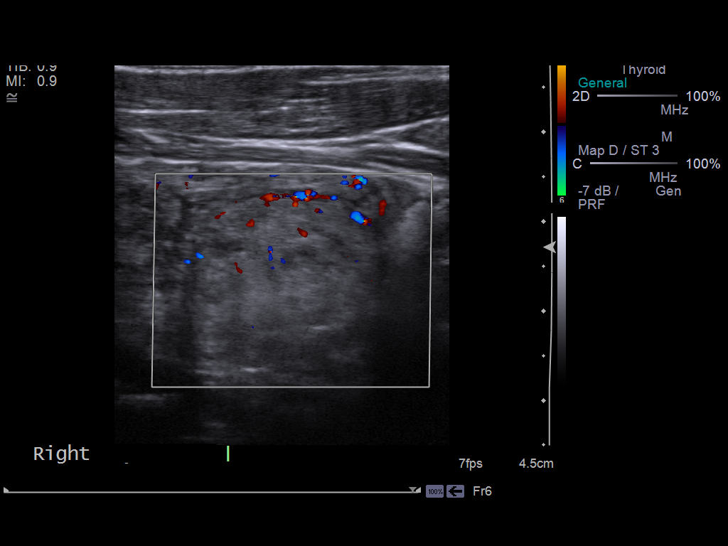
[im 5/13]
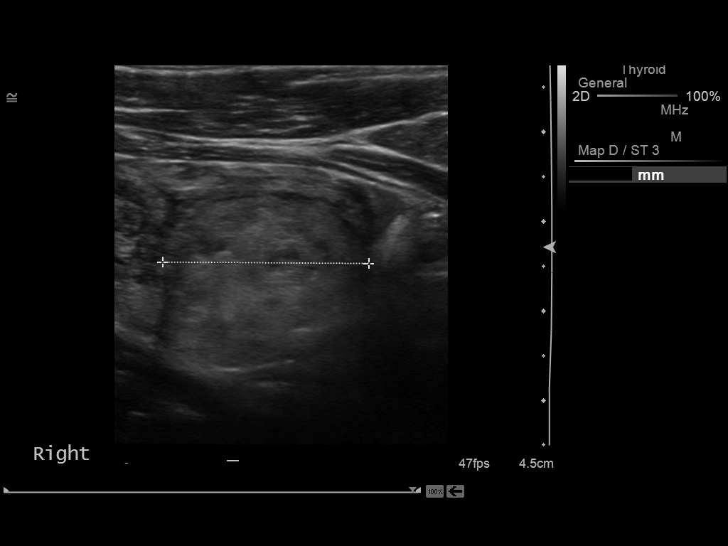
[im 6/13]
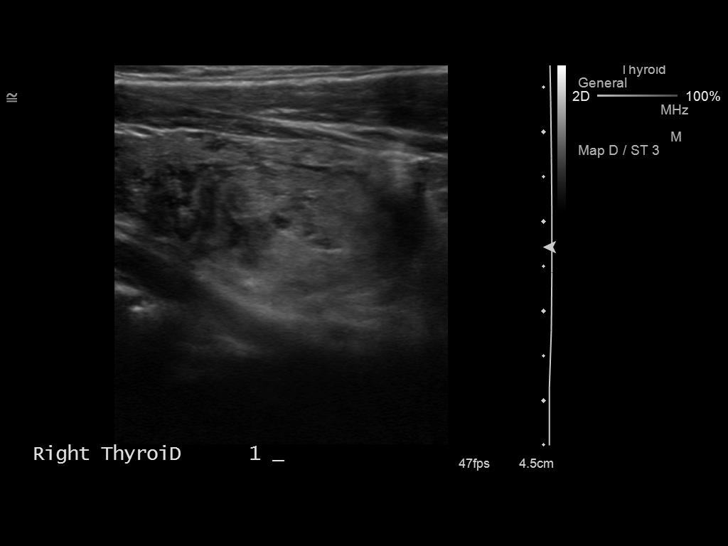
[im 7/13]
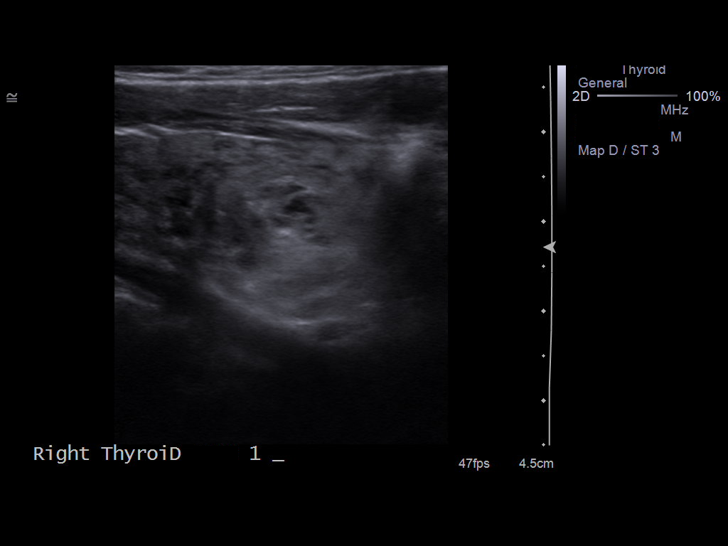
[im 8/13]
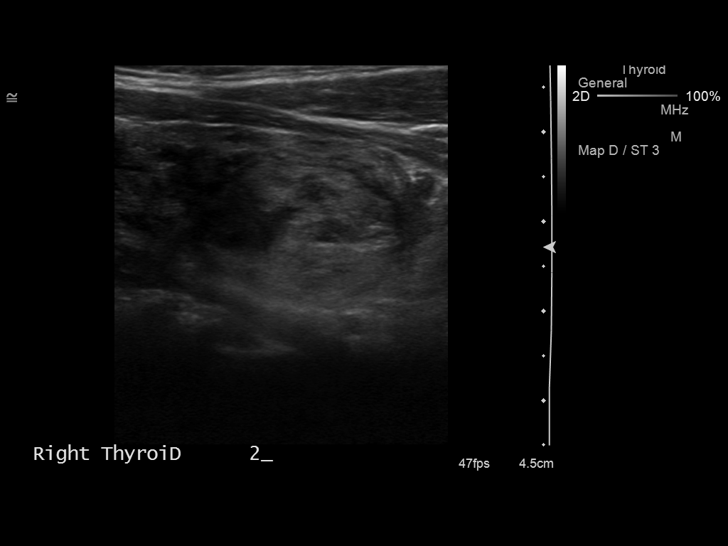
[im 9/13]
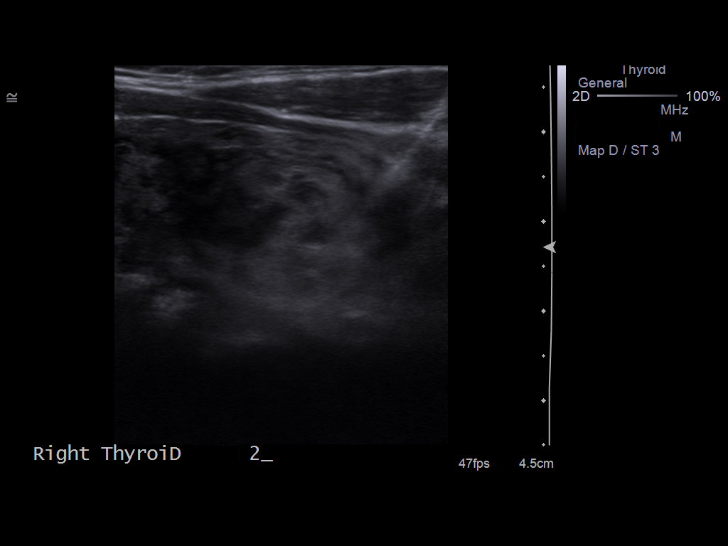
[im 10/13]
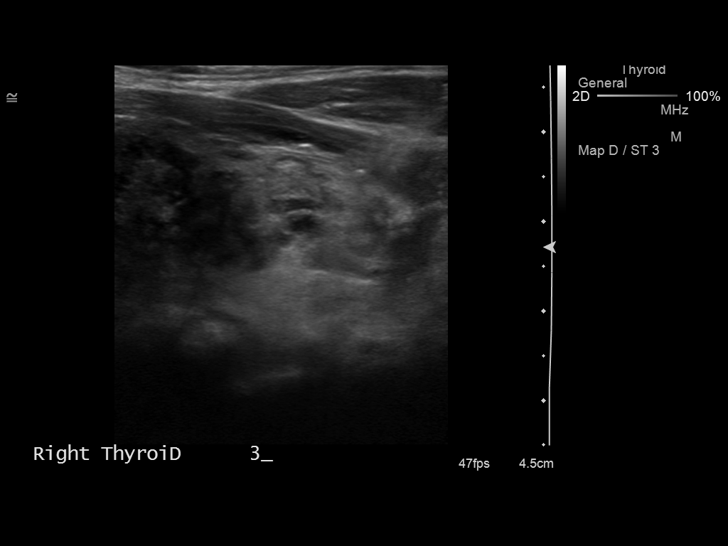
[im 11/13]
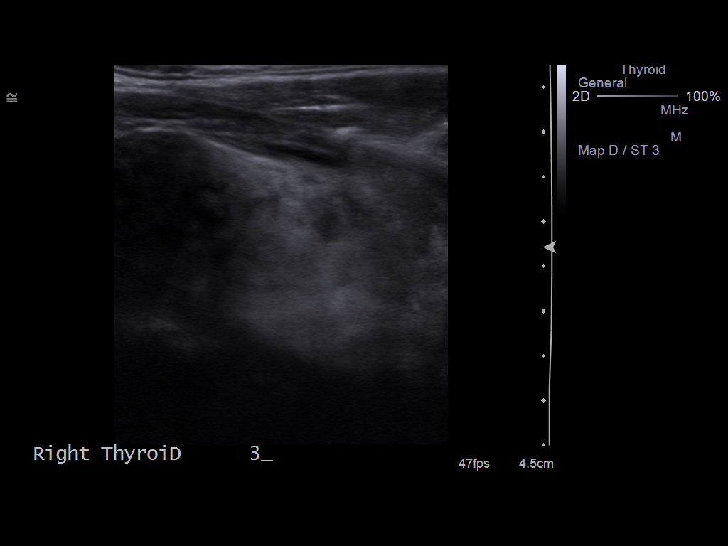
[im 12/13]
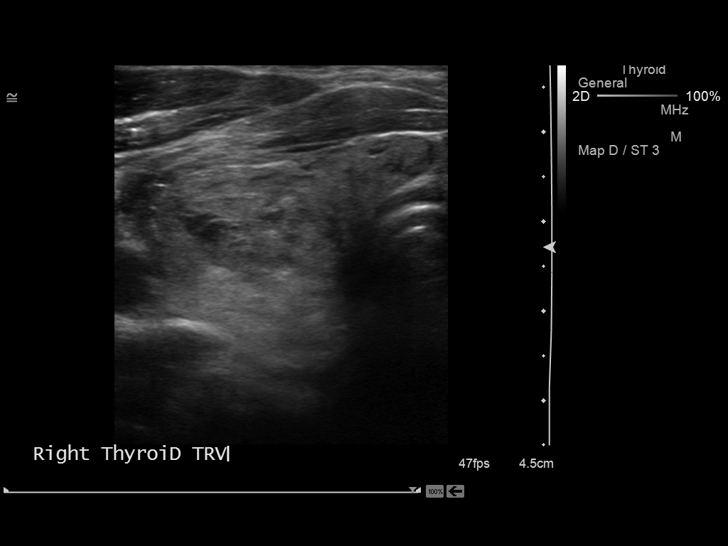
[im 13/13]
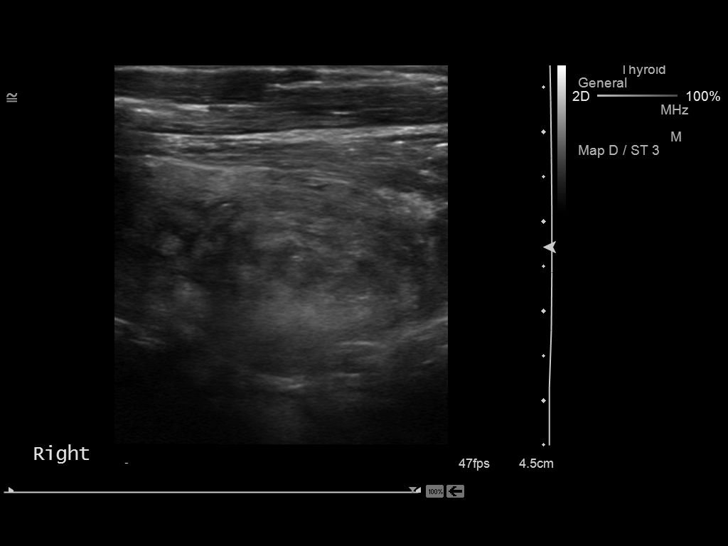

[13 of 13 positions shown; findings below may reference images not displayed]

Skin prepped and draped in usual sterile fashion.
Skin and soft tissues anesthetized with 3 ml of % lidocaine.
Under sonographic guidance, three 25-gauge fine needle aspirations
of the right thyroid mass were performed.
Procedure tolerated well by patient without immediate complication.
No evidence of hematoma on postprocedural images.
Standard post procedure instructions were given to patient.
Specimen sent to laboratory for analysis.
IMPRESSION: Ultrasound guided fine needle aspiration biopsy of a right lobe
thyroid mass.

## 2015-05-03 DIAGNOSIS — J3089 Other allergic rhinitis: Secondary | ICD-10-CM | POA: Diagnosis not present

## 2015-05-12 ENCOUNTER — Ambulatory Visit (INDEPENDENT_AMBULATORY_CARE_PROVIDER_SITE_OTHER): Payer: Medicare Other | Admitting: Otolaryngology

## 2015-05-12 DIAGNOSIS — J310 Chronic rhinitis: Secondary | ICD-10-CM | POA: Diagnosis not present

## 2015-05-12 DIAGNOSIS — J0101 Acute recurrent maxillary sinusitis: Secondary | ICD-10-CM

## 2015-05-17 DIAGNOSIS — J3089 Other allergic rhinitis: Secondary | ICD-10-CM | POA: Diagnosis not present

## 2015-05-18 ENCOUNTER — Other Ambulatory Visit (INDEPENDENT_AMBULATORY_CARE_PROVIDER_SITE_OTHER): Payer: Self-pay | Admitting: Otolaryngology

## 2015-05-18 DIAGNOSIS — J329 Chronic sinusitis, unspecified: Secondary | ICD-10-CM

## 2015-05-20 ENCOUNTER — Ambulatory Visit (HOSPITAL_COMMUNITY)
Admission: RE | Admit: 2015-05-20 | Discharge: 2015-05-20 | Disposition: A | Discharge status: Home / Self Care | Payer: Medicare Other | Source: Ambulatory Visit | Attending: Otolaryngology | Admitting: Otolaryngology

## 2015-05-20 DIAGNOSIS — J329 Chronic sinusitis, unspecified: Secondary | ICD-10-CM | POA: Insufficient documentation

## 2015-05-20 DIAGNOSIS — J0101 Acute recurrent maxillary sinusitis: Secondary | ICD-10-CM | POA: Insufficient documentation

## 2015-05-21 ENCOUNTER — Encounter (HOSPITAL_COMMUNITY): Payer: Self-pay | Admitting: Hematology & Oncology

## 2015-05-26 ENCOUNTER — Ambulatory Visit (INDEPENDENT_AMBULATORY_CARE_PROVIDER_SITE_OTHER): Payer: Medicare Other | Admitting: Otolaryngology

## 2015-05-26 DIAGNOSIS — J322 Chronic ethmoidal sinusitis: Secondary | ICD-10-CM

## 2015-05-26 DIAGNOSIS — J320 Chronic maxillary sinusitis: Secondary | ICD-10-CM

## 2015-06-02 ENCOUNTER — Ambulatory Visit (INDEPENDENT_AMBULATORY_CARE_PROVIDER_SITE_OTHER): Payer: Medicare Other | Admitting: Internal Medicine

## 2015-06-02 ENCOUNTER — Ambulatory Visit (HOSPITAL_COMMUNITY)
Admission: RE | Admit: 2015-06-02 | Discharge: 2015-06-02 | Disposition: A | Discharge status: Home / Self Care | Payer: Medicare Other | Source: Ambulatory Visit | Attending: Family Medicine | Admitting: Family Medicine

## 2015-06-02 ENCOUNTER — Encounter (INDEPENDENT_AMBULATORY_CARE_PROVIDER_SITE_OTHER): Payer: Self-pay | Admitting: Internal Medicine

## 2015-06-02 ENCOUNTER — Ambulatory Visit (INDEPENDENT_AMBULATORY_CARE_PROVIDER_SITE_OTHER): Payer: Medicare Other | Admitting: Family Medicine

## 2015-06-02 ENCOUNTER — Encounter: Payer: Self-pay | Admitting: Family Medicine

## 2015-06-02 VITALS — BP 130/82 | HR 64 | Temp 97.6°F | Ht 68.0 in | Wt 186.6 lb

## 2015-06-02 VITALS — BP 116/70 | Temp 98.3°F | Ht 68.0 in | Wt 188.0 lb

## 2015-06-02 DIAGNOSIS — R05 Cough: Secondary | ICD-10-CM

## 2015-06-02 DIAGNOSIS — K219 Gastro-esophageal reflux disease without esophagitis: Secondary | ICD-10-CM

## 2015-06-02 DIAGNOSIS — J45909 Unspecified asthma, uncomplicated: Secondary | ICD-10-CM | POA: Insufficient documentation

## 2015-06-02 DIAGNOSIS — J189 Pneumonia, unspecified organism: Secondary | ICD-10-CM | POA: Diagnosis not present

## 2015-06-02 DIAGNOSIS — R059 Cough, unspecified: Secondary | ICD-10-CM

## 2015-06-02 DIAGNOSIS — R0989 Other specified symptoms and signs involving the circulatory and respiratory systems: Secondary | ICD-10-CM | POA: Insufficient documentation

## 2015-06-02 DIAGNOSIS — J329 Chronic sinusitis, unspecified: Secondary | ICD-10-CM

## 2015-06-02 DIAGNOSIS — R918 Other nonspecific abnormal finding of lung field: Secondary | ICD-10-CM | POA: Diagnosis not present

## 2015-06-02 MED ORDER — LEVOFLOXACIN 500 MG PO TABS: 500.0000 mg | ORAL_TABLET | Freq: Every day | ORAL | Status: DC

## 2015-06-02 NOTE — Patient Instructions (Signed)
OV in 2 years. Continue the Omeprazole

## 2015-06-02 NOTE — Progress Notes (Addendum)
   Subjective:    Patient ID: Thomas [name], male    DOB: [birth year], 81 y.o.   MRN: [id number]  Sinusitis This is a recurrent problem. The current episode started more than 1 month ago. The problem has been gradually worsening since onset. Associated symptoms include chills, congestion, coughing, headaches and sinus pressure. (Abdominal pain) Treatments tried: Clartin, Advil, Mucinex DM max. The treatment provided no relief.   Patient states went to see Dr.Teoh on 05/12/15 for sinus infection. Had CT scan done on sinus cavities. Was treated with prednisone with Dr.Teoh.  patient with a lot of coughing but denies high fevers does relate some chills felt bad over the past week Patient relates several weeks of head congestion drainage coughing Review of Systems  Constitutional: Positive for chills.  HENT: Positive for congestion and sinus pressure.   Respiratory: Positive for cough.   Neurological: Positive for headaches.   25 minutes spent with this patient both in the evaluation treatment plus also discussion of the results of his CAT scan and the implications of this. Community-acquired pneumonia    Objective:   Physical Exam  left lower lung has crackles rest of lung normal heart regular neck no masses HEENT mild sinus tenderness  Suspected pneumonia    Assessment & Plan:   sinusitis Levaquin 10 days Chest x-ray because of lung findings await the results Suspected pneumonia.  It should be noted that the chest x-ray did in fact show pneumonia. Patient on antibiotics. Patient aware. Patient needs to repeat chest x-ray in 3-4 weeks May need CT scan if doesn't clear up patient is to call us if worse or other problems

## 2015-06-02 NOTE — Addendum Note (Signed)
Addended by: Sallee Lange A on: 06/02/2015 06:00 PM   Modules accepted: Level of Service

## 2015-06-02 NOTE — Progress Notes (Addendum)
Subjective:    Patient ID: Thomas [name], male    DOB: December 19, 1934, 81 y.o.   MRN: [id number]  HPI Here today for f/u of his chronic GERD. He was last seen in 2015. He tells me his acid reflux has been doing good. He does tells me he had a stomach virus in December.He had a sinus infection and was given a Zpak. After taking the Zpak he developed diarrhea. He says his stools was green and terrible odor. The diarrhea lasted one day. After the diarrhea stopped, he felt much better. Appetite is good. He has lost 10 pounds since his last visit in 2015. No melena or BRRB   Hx of polycythemia vera and is followed by the [place] at [facility].   06/2005 Colonoscopy with polypectomy: FINAL DIAGNOSES:  1. A 5-to-6-mm polyp snared from the cecum.  2. A few tiny diverticula at the sigmoid colon.  Biopsy: 1) polyp (Cecum, polypectomy): SMALL INTRAMUCOSAL LEIOMYOMA WITH INFLAMMATION OF OVERLYING MUCOSA. SEE MICROSCOPIC.   Review of Systems Past Medical History  Diagnosis Date  . Asthma   . Pollen allergies   . Kidney stones   . Hypertension   . Sleep apnea     mild no cpap  . Hernia   . History of stress test 2011  . Hypothyroidism   . GERD (gastroesophageal reflux disease)   . Renal calculus     some removed by Cystoscopy, one passed spontaneously  . H/O hiatal hernia   . Arthritis     "my entire back is gone"  . Polycythemia     Past Surgical History  Procedure Laterality Date  . Back surgery      L-4 , L-5  patient states that it was years ago.  Marland Kitchen Appendectomy      50 years ago  . Colonoscopy      at age 31  . Upper gastrointestinal endoscopy      egd/ed  . Eye surgery      cataracts removed- bilateral- /w IOL  . Thyroidectomy Right 09/17/2012    Procedure: RIGHT HEMI-THYROIDECTOMY;  Surgeon: Ascencion Dike, MD;  Location: [place];  Service: ENT;  Laterality: Right;    Allergies  Allergen Reactions  . Penicillins Other (See Comments)    Reaction many years ago-unknown  reaction    Current Outpatient Prescriptions on File Prior to Visit  Medication Sig Dispense Refill  . albuterol (PROVENTIL HFA;VENTOLIN HFA) 108 (90 BASE) MCG/ACT inhaler Inhale 2 puffs into the lungs every 4 (four) hours as needed. (Patient taking differently: Inhale 2 puffs into the lungs every 4 (four) hours as needed for wheezing or shortness of breath. ) 1 Inhaler 0  . budesonide-formoterol (SYMBICORT) 160-4.5 MCG/ACT inhaler Inhale 1 puff into the lungs 2 (two) times daily.     . fluticasone (FLONASE) 50 MCG/ACT nasal spray INHALE 1 SPRAY INTO EACH NOSTRIL TWICE DAILY. 16 g 5  . gabapentin (NEURONTIN) 400 MG capsule Take 1 tab in am, 1 tab mid day, 2 tabs at bedtime (Patient taking differently: Take 400-800 mg by mouth 3 (three) times daily. Take 1 tab in am, 1 tab mid day, 2 tabs at bedtime) 120 capsule 5  . levothyroxine (SYNTHROID, LEVOTHROID) 100 MCG tablet TAKE ONE DROPPERFUL BY MOUTH DAILY BEFORE BREAKFAST. (Patient taking differently: TAKE ONE TABLET BY MOUTH DAILY BEFORE BREAKFAST.) 90 tablet 0  . omeprazole (PRILOSEC) 20 MG capsule TAKE (1) CAPSULE BY MOUTH ONCE DAILY. (Patient taking differently: Take 20 mg by  mouth daily. ) 90 capsule 1  . pravastatin (PRAVACHOL) 40 MG tablet TAKE ONE TABLET AT BEDTIME AS DIRECTED. 90 tablet 0  . valsartan-hydrochlorothiazide (DIOVAN-HCT) 80-12.5 MG tablet TAKE ONE TABLET BY MOUTH ONCE DAILY. 90 tablet 0   No current facility-administered medications on file prior to visit.        Objective:   Physical Exam Blood pressure 130/82, pulse 64, temperature 97.6 F (36.4 C), height 5\' 8"  (1.727 m), weight 186 lb 9.6 oz (84.641 kg).  Alert and oriented. Skin warm and dry. Oral mucosa is moist.   . Sclera anicteric, conjunctivae is pink. Thyroid not enlarged. No cervical lymphadenopathy. Lungs clear. Heart regular rate and rhythm.  Abdomen is soft. Bowel sounds are positive. No hepatomegaly. No abdominal masses felt. No tenderness.  No edema to lower  extremities        Assessment & Plan:  GERD controlled with Omeprazole at this time. OV in 1 years.

## 2015-06-03 ENCOUNTER — Other Ambulatory Visit: Payer: Self-pay

## 2015-06-03 DIAGNOSIS — R059 Cough, unspecified: Secondary | ICD-10-CM

## 2015-06-03 DIAGNOSIS — R05 Cough: Secondary | ICD-10-CM

## 2015-06-07 DIAGNOSIS — J3089 Other allergic rhinitis: Secondary | ICD-10-CM | POA: Diagnosis not present

## 2015-06-14 DIAGNOSIS — J3089 Other allergic rhinitis: Secondary | ICD-10-CM | POA: Diagnosis not present

## 2015-06-21 DIAGNOSIS — J3089 Other allergic rhinitis: Secondary | ICD-10-CM | POA: Diagnosis not present

## 2015-06-27 ENCOUNTER — Ambulatory Visit (HOSPITAL_COMMUNITY)
Admission: RE | Admit: 2015-06-27 | Discharge: 2015-06-27 | Disposition: A | Discharge status: Home / Self Care | Payer: Medicare Other | Source: Ambulatory Visit | Attending: Family Medicine | Admitting: Family Medicine

## 2015-06-27 DIAGNOSIS — J189 Pneumonia, unspecified organism: Secondary | ICD-10-CM | POA: Diagnosis not present

## 2015-06-27 DIAGNOSIS — R05 Cough: Secondary | ICD-10-CM | POA: Insufficient documentation

## 2015-06-27 DIAGNOSIS — J9811 Atelectasis: Secondary | ICD-10-CM | POA: Insufficient documentation

## 2015-06-27 DIAGNOSIS — J3089 Other allergic rhinitis: Secondary | ICD-10-CM | POA: Diagnosis not present

## 2015-06-29 ENCOUNTER — Other Ambulatory Visit: Payer: Self-pay | Admitting: Family Medicine

## 2015-06-30 ENCOUNTER — Encounter (INDEPENDENT_AMBULATORY_CARE_PROVIDER_SITE_OTHER): Payer: Self-pay | Admitting: *Deleted

## 2015-07-04 ENCOUNTER — Encounter (HOSPITAL_COMMUNITY): Payer: Medicare Other

## 2015-07-04 ENCOUNTER — Encounter (HOSPITAL_COMMUNITY): Payer: Self-pay | Admitting: Hematology & Oncology

## 2015-07-04 ENCOUNTER — Encounter (HOSPITAL_COMMUNITY): Payer: Medicare Other | Attending: Hematology & Oncology | Admitting: Hematology & Oncology

## 2015-07-04 VITALS — BP 133/62 | HR 64 | Temp 97.8°F | Resp 18 | Wt 188.0 lb

## 2015-07-04 DIAGNOSIS — D751 Secondary polycythemia: Secondary | ICD-10-CM | POA: Diagnosis not present

## 2015-07-04 DIAGNOSIS — J449 Chronic obstructive pulmonary disease, unspecified: Secondary | ICD-10-CM

## 2015-07-04 DIAGNOSIS — J4520 Mild intermittent asthma, uncomplicated: Secondary | ICD-10-CM

## 2015-07-04 DIAGNOSIS — J45909 Unspecified asthma, uncomplicated: Secondary | ICD-10-CM

## 2015-07-04 DIAGNOSIS — D696 Thrombocytopenia, unspecified: Secondary | ICD-10-CM

## 2015-07-04 DIAGNOSIS — Z720 Tobacco use: Secondary | ICD-10-CM

## 2015-07-04 DIAGNOSIS — D45 Polycythemia vera: Secondary | ICD-10-CM

## 2015-07-04 LAB — CBC WITH DIFFERENTIAL/PLATELET
BASOS ABS: 0 10*3/uL (ref 0.0–0.1)
BASOS PCT: 0 %
Eosinophils Absolute: 0.1 10*3/uL (ref 0.0–0.7)
Eosinophils Relative: 2 %
HEMATOCRIT: 50.6 % (ref 39.0–52.0)
HEMOGLOBIN: 18.4 g/dL — AB (ref 13.0–17.0)
LYMPHS PCT: 33 %
Lymphs Abs: 1.5 10*3/uL (ref 0.7–4.0)
MCH: 34 pg (ref 26.0–34.0)
MCHC: 36.4 g/dL — ABNORMAL HIGH (ref 30.0–36.0)
MCV: 93.5 fL (ref 78.0–100.0)
Monocytes Absolute: 0.3 10*3/uL (ref 0.1–1.0)
Monocytes Relative: 6 %
NEUTROS ABS: 2.6 10*3/uL (ref 1.7–7.7)
NEUTROS PCT: 59 %
Platelets: 144 10*3/uL — ABNORMAL LOW (ref 150–400)
RBC: 5.41 MIL/uL (ref 4.22–5.81)
RDW: 13.3 % (ref 11.5–15.5)
WBC: 4.5 10*3/uL (ref 4.0–10.5)

## 2015-07-04 LAB — COMPREHENSIVE METABOLIC PANEL
ALBUMIN: 4.3 g/dL (ref 3.5–5.0)
ALK PHOS: 62 U/L (ref 38–126)
ALT: 48 U/L (ref 17–63)
ANION GAP: 7 (ref 5–15)
AST: 42 U/L — ABNORMAL HIGH (ref 15–41)
BILIRUBIN TOTAL: 1.4 mg/dL — AB (ref 0.3–1.2)
BUN: 12 mg/dL (ref 6–20)
CALCIUM: 9.1 mg/dL (ref 8.9–10.3)
CO2: 28 mmol/L (ref 22–32)
CREATININE: 1.31 mg/dL — AB (ref 0.61–1.24)
Chloride: 105 mmol/L (ref 101–111)
GFR calc non Af Amer: 50 mL/min — ABNORMAL LOW (ref >60)
GFR, EST AFRICAN AMERICAN: 58 mL/min — AB (ref >60)
GLUCOSE: 101 mg/dL — AB (ref 65–99)
Potassium: 3.6 mmol/L (ref 3.5–5.1)
SODIUM: 140 mmol/L (ref 135–145)
TOTAL PROTEIN: 7.1 g/dL (ref 6.5–8.1)

## 2015-07-04 LAB — SEDIMENTATION RATE: Sed Rate: 2 mm/hr (ref 0–16)

## 2015-07-04 NOTE — Progress Notes (Signed)
[place] at [facility] Progress Note  Patient Care Team: [name], MD as PCP - General (Family Medicine)  CHIEF COMPLAINTS/PURPOSE OF CONSULTATION:  Polycythemia, JAK-2 negative   HISTORY OF PRESENTING ILLNESS:  Thomas [name] 81 y.o. male is here because of JAK-2 negative polycythemia. He has never smoked but used chewing tobacco most of his life.  He is present alone today. 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 He has never been hospitalized for an asthma exacerbation.  Mr. Karim is here alone. I personally reviewed and went over laboratory studies with the patient at length.  He has been doing well. His asthma continues to be well controlled. His appetite is good, "eating everything I get my hands on". He has no other major complaints or concerns.   MEDICAL HISTORY:  Past Medical History  Diagnosis Date  . Asthma   . Pollen allergies   . Kidney stones   . Hypertension   . Sleep apnea     mild no cpap  . Hernia   . History of stress test 2011  . Hypothyroidism   . GERD (gastroesophageal reflux disease)   . Renal calculus     some removed by Cystoscopy, one passed spontaneously  . H/O hiatal hernia   . Arthritis     "my entire back is gone"  . Polycythemia     SURGICAL HISTORY: Past Surgical History  Procedure Laterality Date  . Back surgery      L-4 , L-5  patient states that it was years ago.  Marland Kitchen Appendectomy      50 years ago  . Colonoscopy      at age 89  . Upper gastrointestinal endoscopy      egd/ed  . Eye surgery      cataracts removed- bilateral- /w IOL  . Thyroidectomy Right 09/17/2012    Procedure: RIGHT HEMI-THYROIDECTOMY;  Surgeon: Ascencion Dike, MD;  Location: [facility] OR;  Service: ENT;   Laterality: Right;    SOCIAL HISTORY: Social History   Social History  . Marital Status: Widowed    Spouse Name: N/A  . Number of Children: N/A  . Years of Education: N/A   Occupational History  . Not on file.   Social History Main Topics  . Smoking status: Never Smoker   . Smokeless tobacco: Current User    Types: Chew     Comment: Patient states that he has been chewing for 60 plus years  . Alcohol Use: No     Comment: Patient states that he drinks a bout a case of beer a year  . Drug Use: No  . Sexual Activity: No   Other Topics Concern  . Not on file   Social History Narrative    FAMILY HISTORY: Family History  Problem Relation Age of Onset  . Heart disease Mother   . Heart disease Father   . Heart disease Brother   . Healthy Son    indicated that his mother is deceased. He indicated that his father is deceased. He indicated that his sister is deceased. He indicated that his brother is deceased. He indicated that his son is alive. He indicated that his child is alive.  He indicated that his other is alive.   ALLERGIES:  is allergic to penicillins.  MEDICATIONS:  Current Outpatient Prescriptions  Medication Sig Dispense Refill  . albuterol (PROVENTIL HFA;VENTOLIN HFA) 108 (90 BASE) MCG/ACT inhaler Inhale 2 puffs into the lungs every 4 (four) hours as needed. (Patient taking differently: Inhale 2 puffs into the lungs every 4 (four) hours as needed for wheezing or shortness of breath. ) 1 Inhaler 0  . budesonide-formoterol (SYMBICORT) 160-4.5 MCG/ACT inhaler Inhale 1 puff into the lungs 2 (two) times daily.     . fluticasone (FLONASE) 50 MCG/ACT nasal spray INHALE 1 SPRAY INTO EACH NOSTRIL TWICE DAILY. 16 g 5  . gabapentin (NEURONTIN) 400 MG capsule Take 1 tab in am, 1 tab mid day, 2 tabs at bedtime (Patient taking differently: Take 400-800 mg by mouth 3 (three) times daily. Take 1 tab in am, 1 tab mid day, 2 tabs at bedtime) 120 capsule 5  . levothyroxine  (SYNTHROID, LEVOTHROID) 100 MCG tablet TAKE ONE DROPPERFUL BY MOUTH DAILY BEFORE BREAKFAST. (Patient taking differently: TAKE ONE TABLET BY MOUTH DAILY BEFORE BREAKFAST.) 90 tablet 0  . omeprazole (PRILOSEC) 20 MG capsule TAKE (1) CAPSULE BY MOUTH ONCE DAILY. (Patient taking differently: Take 20 mg by mouth daily. ) 90 capsule 1  . pravastatin (PRAVACHOL) 40 MG tablet TAKE ONE TABLET AT BEDTIME AS DIRECTED. 90 tablet 0  . valsartan-hydrochlorothiazide (DIOVAN-HCT) 80-12.5 MG tablet TAKE ONE TABLET BY MOUTH ONCE DAILY. 90 tablet 0   No current facility-administered medications for this visit.    Review of Systems  Constitutional: Negative for fever, chills, weight loss and malaise/fatigue.  HENT: Negative for congestion, hearing loss, nosebleeds, sore throat and tinnitus.   Eyes: Negative for blurred vision, double vision, pain and discharge.  Respiratory: Negative for cough, hemoptysis, sputum production, shortness of breath and wheezing.        Rare wheezing or SOB  Cardiovascular: Negative for chest pain, palpitations, claudication, leg swelling and PND.  Gastrointestinal: Negative for heartburn, nausea, vomiting, abdominal pain, diarrhea, constipation, blood in stool and melena.  Genitourinary: Negative for dysuria, urgency, frequency and hematuria.  Musculoskeletal: Negative for myalgias, joint pain and falls.  Skin: Negative for itching and rash.  Neurological: Negative for dizziness, tingling, tremors, sensory change, speech change, focal weakness, seizures, loss of consciousness, weakness and headaches.  Endo/Heme/Allergies: Does not bruise/bleed easily.  Psychiatric/Behavioral: Negative for depression, suicidal ideas, memory loss and substance abuse. The patient is not nervous/anxious and does not have insomnia.    14 point ROS was done and is otherwise as detailed above or in HPI   PHYSICAL EXAMINATION: ECOG PERFORMANCE STATUS: 0 - Asymptomatic  Filed Vitals:   07/04/15 1400    BP: 133/62  Pulse: 64  Temp: 97.8 F (36.6 C)  Resp: 18   Filed Weights   07/04/15 1400  Weight: 188 lb (85.276 kg)    Physical Exam  Constitutional: He is oriented to person, place, and time and well-developed, well-nourished, and in no distress.  Appears younger than stated age  HENT:  Head: Normocephalic and atraumatic.  Nose: Nose normal.  Mouth/Throat: Oropharynx is clear and moist. No oropharyngeal exudate.  Eyes: Conjunctivae and EOM are normal. Pupils are equal, round, and reactive to light. Right eye exhibits no discharge. Left eye exhibits no discharge. No scleral icterus.  Neck: Normal range of motion. Neck supple. No tracheal deviation present. No thyromegaly present.  Cardiovascular: Normal rate, regular rhythm and normal heart sounds.  Exam reveals no gallop and  no friction rub.   No murmur heard. Pulmonary/Chest: Effort normal and breath sounds normal. He has no wheezes. He has no rales.  Abdominal: Soft. Bowel sounds are normal. He exhibits no distension and no mass. There is no tenderness. There is no rebound and no guarding.  Musculoskeletal: Normal range of motion. He exhibits no edema.  Lymphadenopathy:    He has no cervical adenopathy.  Neurological: He is alert and oriented to person, place, and time. He has normal reflexes. No cranial nerve deficit. Gait normal. Coordination normal.  Skin: Skin is warm and dry. No rash noted.  Psychiatric: Mood, memory, affect and judgment normal.  Nursing note and vitals reviewed.   LABORATORY DATA:  I have reviewed the data as listed Lab Results  Component Value Date   WBC 4.5 07/04/2015   HGB 18.4* 07/04/2015   HCT 50.6 07/04/2015   MCV 93.5 07/04/2015   PLT 144* 07/04/2015   Results for DONYE, CAMPANELLI (MRN [id number]) as of 07/04/2015 14:02  Ref. Range 07/04/2015 13:12  Sodium Latest Ref Range: 135-145 mmol/L 140  Potassium Latest Ref Range: 3.5-5.1 mmol/L 3.6  Chloride Latest Ref Range: 101-111 mmol/L 105   CO2 Latest Ref Range: 22-32 mmol/L 28  BUN Latest Ref Range: 6-20 mg/dL 12  Creatinine Latest Ref Range: 0.61-1.24 mg/dL 1.31 (H)  Calcium Latest Ref Range: 8.9-10.3 mg/dL 9.1  EGFR (Non-African Amer.) Latest Ref Range: >60 mL/min 50 (L)  EGFR (African American) Latest Ref Range: >60 mL/min 58 (L)  Glucose Latest Ref Range: 65-99 mg/dL 101 (H)  Anion gap Latest Ref Range: 5-15  7  Alkaline Phosphatase Latest Ref Range: 38-126 U/L 62  Albumin Latest Ref Range: 3.5-5.0 g/dL 4.3  AST Latest Ref Range: 15-41 U/L 42 (H)  ALT Latest Ref Range: 17-63 U/L 48  Total Protein Latest Ref Range: 6.5-8.1 g/dL 7.1  Total Bilirubin Latest Ref Range: 0.3-1.2 mg/dL 1.4 (H)  WBC Latest Ref Range: 4.0-10.5 K/uL 4.5  RBC Latest Ref Range: 4.22-5.81 MIL/uL 5.41  Hemoglobin Latest Ref Range: 13.0-17.0 g/dL 18.4 (H)  HCT Latest Ref Range: 39.0-52.0 % 50.6  MCV Latest Ref Range: 78.0-100.0 fL 93.5  MCH Latest Ref Range: 26.0-34.0 pg 34.0  MCHC Latest Ref Range: 30.0-36.0 g/dL 36.4 (H)  RDW Latest Ref Range: 11.5-15.5 % 13.3  Platelets Latest Ref Range: 150-400 K/uL 144 (L)  Neutrophils Latest Units: % 59  Lymphocytes Latest Units: % 33  Monocytes Relative Latest Units: % 6  Eosinophil Latest Units: % 2  Basophil Latest Units: % 0  NEUT# Latest Ref Range: 1.7-7.7 K/uL 2.6  Lymphocyte # Latest Ref Range: 0.7-4.0 K/uL 1.5  Monocyte # Latest Ref Range: 0.1-1.0 K/uL 0.3  Eosinophils Absolute Latest Ref Range: 0.0-0.7 K/uL 0.1  Basophils Absolute Latest Ref Range: 0.0-0.1 K/uL 0.0    ASSESSMENT & PLAN:  Polycythemia JAK-2 V617 F mutation negative,JAK 2 EXON 12 mutation negative, MPL mutation negative, CALR mutation negative Asthma No history of cigarette use Thrombocytopenia  He has a history of asthma but does not appear to have her have been limited by his disease. He is very physically active. He only uses a Symbicort inhaler. He has never been hospitalized for an asthma exacerbation. He has never  smoked. He has been tested for sleep apnea and was told it was negative. An erythropoietin level was checked last year and was 9.4 mIU/ml.  Testing for MPD is negative.   I personally reviewed and went over laboratory studies with the patient at length. We  will continue with observation.   He will return for follow up in 3 months with repeat labs.   Please call the clinic if you have any questions or concerns   All questions were answered. The patient knows to call the clinic with any problems, questions or concerns.   This document serves as a record of services personally performed by Ancil Linsey, MD. It was created on her behalf by Arlyce Harman, a trained medical scribe. The creation of this record is based on the scribe's personal observations and the provider's statements to them. This document has been checked and approved by the attending provider.  I have reviewed the above documentation for accuracy and completeness, and I agree with the above.  This note was electronically signed.   Kelby Fam. [place]

## 2015-07-04 NOTE — Patient Instructions (Addendum)
[place] at [facility] Discharge Instructions  RECOMMENDATIONS MADE BY THE CONSULTANT AND ANY TEST RESULTS WILL BE SENT TO YOUR REFERRING PHYSICIAN.   Exam and discussion by Dr Whitney Muse today Hemoglobin 18.4 hematocrit 50.6 Kidney function increased a little, increase your H20 intake  We checked antibody levels today, we will call you with your results  We will try to explain what it means over the phone if we cant then we will make you an appt Return to see the doctor in 3 months with labs Please call the clinic if you have any questions or concerns    Thank you for choosing [place] at [facility] to provide your oncology and hematology care.  To afford each patient quality time with our provider, please arrive at least 15 minutes before your scheduled appointment time.   Beginning January 23rd 2017 lab work for the [facility] will be done in the  Main lab at [facility] on 1st floor. If you have a lab appointment with the [place] please come in thru the  Main Entrance and check in at the main information desk  You need to re-schedule your appointment should you arrive 10 or more minutes late.  We strive to give you quality time with our providers, and arriving late affects you and other patients whose appointments are after yours.  Also, if you no show three or more times for appointments you may be dismissed from the clinic at the providers discretion.     Again, thank you for choosing [facility].  Our hope is that these requests will decrease the amount of time that you wait before being seen by our physicians.       _____________________________________________________________  Should you have questions after your visit to [facility], please contact our office at [phone] between the hours of 8:30 a.m. and 4:30 p.m.  Voicemails left after 4:30 p.m. will not be returned until the following  business day.  For prescription refill requests, have your pharmacy contact our office.         Resources For Cancer Patients and their Caregivers ? American Cancer Society: Can assist with transportation, wigs, general needs, runs Look Good Feel Better.        2235170199 ? Cancer Care: Provides financial assistance, online support groups, medication/co-pay assistance.  1-800-813-HOPE (617)052-6341) ? [place] Assists [place] cancer patients and their families through emotional , educational and financial support.  407-093-8324 ? [place] DSS Where to apply for food stamps, Medicaid and utility assistance. [phone] ? RCATS: Transportation to medical appointments. 6707241825 ? Social Security Administration: May apply for disability if have a Stage IV cancer. ([phone] 5708196284 ? [employer], Disability and Transit Services: Assists with nutrition, care and transit needs. [phone]

## 2015-07-05 LAB — IGG, IGA, IGM
IGM, SERUM: 42 mg/dL (ref 15–143)
IgA: 243 mg/dL (ref 61–437)
IgG (Immunoglobin G), Serum: 983 mg/dL (ref 700–1600)

## 2015-07-07 DIAGNOSIS — J3089 Other allergic rhinitis: Secondary | ICD-10-CM | POA: Diagnosis not present

## 2015-07-12 DIAGNOSIS — J3089 Other allergic rhinitis: Secondary | ICD-10-CM | POA: Diagnosis not present

## 2015-07-14 LAB — CALR + JAK2 E12-15 + MPL (REFLEXED)

## 2015-07-14 LAB — JAK2 V617F, W REFLEX TO CALR/E12/MPL

## 2015-07-21 DIAGNOSIS — J3089 Other allergic rhinitis: Secondary | ICD-10-CM | POA: Diagnosis not present

## 2015-07-26 DIAGNOSIS — J3089 Other allergic rhinitis: Secondary | ICD-10-CM | POA: Diagnosis not present

## 2015-08-02 DIAGNOSIS — J3089 Other allergic rhinitis: Secondary | ICD-10-CM | POA: Diagnosis not present

## 2015-08-04 ENCOUNTER — Other Ambulatory Visit: Payer: Self-pay | Admitting: Family Medicine

## 2015-08-16 ENCOUNTER — Encounter (HOSPITAL_COMMUNITY): Payer: Self-pay | Admitting: Hematology & Oncology

## 2015-08-19 ENCOUNTER — Other Ambulatory Visit: Payer: Self-pay | Admitting: Family Medicine

## 2015-08-23 DIAGNOSIS — J3089 Other allergic rhinitis: Secondary | ICD-10-CM | POA: Diagnosis not present

## 2015-08-25 ENCOUNTER — Telehealth: Payer: Self-pay | Admitting: Family Medicine

## 2015-08-25 ENCOUNTER — Ambulatory Visit (INDEPENDENT_AMBULATORY_CARE_PROVIDER_SITE_OTHER): Payer: Medicare Other | Admitting: Otolaryngology

## 2015-08-25 DIAGNOSIS — I10 Essential (primary) hypertension: Secondary | ICD-10-CM

## 2015-08-25 DIAGNOSIS — Z139 Encounter for screening, unspecified: Secondary | ICD-10-CM

## 2015-08-25 DIAGNOSIS — J310 Chronic rhinitis: Secondary | ICD-10-CM | POA: Diagnosis not present

## 2015-08-25 NOTE — Telephone Encounter (Signed)
Pt is requesting lab work to be sent over for upcoming appt. Last labs per epic were: lipid,hepatic,and bmp on 02/28/15.

## 2015-08-26 NOTE — Telephone Encounter (Signed)
Rep same 

## 2015-08-26 NOTE — Telephone Encounter (Signed)
[facility] 08/26/15

## 2015-08-29 NOTE — Telephone Encounter (Signed)
Blood work ordered in EPIC. Patient notified. 

## 2015-08-30 DIAGNOSIS — J3089 Other allergic rhinitis: Secondary | ICD-10-CM | POA: Diagnosis not present

## 2015-09-06 DIAGNOSIS — J3089 Other allergic rhinitis: Secondary | ICD-10-CM | POA: Diagnosis not present

## 2015-09-07 DIAGNOSIS — Z139 Encounter for screening, unspecified: Secondary | ICD-10-CM | POA: Diagnosis not present

## 2015-09-07 DIAGNOSIS — I10 Essential (primary) hypertension: Secondary | ICD-10-CM | POA: Diagnosis not present

## 2015-09-08 LAB — LIPID PANEL
CHOL/HDL RATIO: 3.5 ratio (ref 0.0–5.0)
Cholesterol, Total: 145 mg/dL (ref 100–199)
HDL: 41 mg/dL (ref >39)
LDL CALC: 73 mg/dL (ref 0–99)
TRIGLYCERIDES: 156 mg/dL — AB (ref 0–149)
VLDL CHOLESTEROL CAL: 31 mg/dL (ref 5–40)

## 2015-09-08 LAB — BASIC METABOLIC PANEL
BUN/Creatinine Ratio: 9 — ABNORMAL LOW (ref 10–24)
BUN: 10 mg/dL (ref 8–27)
CALCIUM: 9.5 mg/dL (ref 8.6–10.2)
CO2: 22 mmol/L (ref 18–29)
Chloride: 100 mmol/L (ref 96–106)
Creatinine, Ser: 1.06 mg/dL (ref 0.76–1.27)
GFR calc Af Amer: 77 mL/min/{1.73_m2} (ref >59)
GFR calc non Af Amer: 67 mL/min/{1.73_m2} (ref >59)
GLUCOSE: 85 mg/dL (ref 65–99)
POTASSIUM: 4.2 mmol/L (ref 3.5–5.2)
Sodium: 143 mmol/L (ref 134–144)

## 2015-09-08 LAB — HEPATIC FUNCTION PANEL
ALT: 39 IU/L (ref 0–44)
AST: 31 IU/L (ref 0–40)
Albumin: 4.3 g/dL (ref 3.5–4.8)
Alkaline Phosphatase: 75 IU/L (ref 39–117)
BILIRUBIN, DIRECT: 0.2 mg/dL (ref 0.00–0.40)
Bilirubin Total: 0.8 mg/dL (ref 0.0–1.2)
TOTAL PROTEIN: 7 g/dL (ref 6.0–8.5)

## 2015-09-14 ENCOUNTER — Ambulatory Visit (INDEPENDENT_AMBULATORY_CARE_PROVIDER_SITE_OTHER): Payer: Medicare Other | Admitting: Family Medicine

## 2015-09-14 ENCOUNTER — Encounter: Payer: Self-pay | Admitting: Family Medicine

## 2015-09-14 VITALS — BP 112/80 | Ht 68.0 in | Wt 185.2 lb

## 2015-09-14 DIAGNOSIS — K219 Gastro-esophageal reflux disease without esophagitis: Secondary | ICD-10-CM

## 2015-09-14 DIAGNOSIS — E038 Other specified hypothyroidism: Secondary | ICD-10-CM

## 2015-09-14 DIAGNOSIS — I10 Essential (primary) hypertension: Secondary | ICD-10-CM | POA: Diagnosis not present

## 2015-09-14 DIAGNOSIS — E785 Hyperlipidemia, unspecified: Secondary | ICD-10-CM

## 2015-09-14 MED ORDER — OMEPRAZOLE 20 MG PO CPDR: 20.0000 mg | DELAYED_RELEASE_CAPSULE | Freq: Every day | ORAL | Status: DC

## 2015-09-14 MED ORDER — TRIAMCINOLONE ACETONIDE 0.1 % EX CREA: 1.0000 "application " | TOPICAL_CREAM | Freq: Two times a day (BID) | CUTANEOUS | Status: DC

## 2015-09-14 MED ORDER — GABAPENTIN 400 MG PO CAPS: 400.0000 mg | ORAL_CAPSULE | Freq: Three times a day (TID) | ORAL | Status: DC

## 2015-09-14 MED ORDER — VALSARTAN-HYDROCHLOROTHIAZIDE 80-12.5 MG PO TABS: 1.0000 | ORAL_TABLET | Freq: Every day | ORAL | Status: DC

## 2015-09-14 MED ORDER — PRAVASTATIN SODIUM 40 MG PO TABS: ORAL_TABLET | ORAL | Status: DC

## 2015-09-14 MED ORDER — LEVOTHYROXINE SODIUM 100 MCG PO TABS: ORAL_TABLET | ORAL | Status: DC

## 2015-09-14 MED ORDER — KETOCONAZOLE 2 % EX CREA: 1.0000 "application " | TOPICAL_CREAM | Freq: Two times a day (BID) | CUTANEOUS | Status: DC

## 2015-09-14 NOTE — Progress Notes (Signed)
   Subjective:    Patient ID: Thomas [name], male    DOB: Jan 13, 1935, 81 y.o.   MRN: [id number]  Hypertension This is a chronic problem. The current episode started more than 1 year ago. The problem has been gradually improving since onset. Pertinent negatives include no chest pain. There are no associated agents to hypertension. There are no known risk factors for coronary artery disease. Treatments tried: diovan-hct. The current treatment provides moderate improvement. There are no compliance problems.    Patient has no concerns at this time.  Patient states overall he is doing fairly well states reflux under good control with current medicine Takes his blood pressure medicine on a regular basis case Takes cholesterol medicine recent lab work reviewed with the patient tries follow healthy diet A shunt does have neuropathic pain related to stroke he does taking Neurontin 3 times daily tolerates it well does not cause drowsiness Patient also relates intermittent respiratory issues takes inhaler as directed does well with this   Review of Systems  Constitutional: Negative for activity change, appetite change and fatigue.  HENT: Negative for congestion.   Respiratory: Negative for cough.   Cardiovascular: Negative for chest pain.  Gastrointestinal: Negative for abdominal pain.  Endocrine: Negative for polydipsia and polyphagia.  Neurological: Negative for weakness.  Psychiatric/Behavioral: Negative for confusion.       Objective:   Physical Exam  Constitutional: He appears well-nourished. No distress.  Cardiovascular: Normal rate, regular rhythm and normal heart sounds.   No murmur heard. Pulmonary/Chest: Effort normal and breath sounds normal. No respiratory distress.  Musculoskeletal: He exhibits no edema.  Lymphadenopathy:    He has no cervical adenopathy.  Neurological: He is alert.  Psychiatric: His behavior is normal.  Vitals reviewed.         Assessment & Plan:    1. HTN (hypertension), benign HTN- Patient was seen today as part of a visit regarding hypertension. The importance of healthy diet and regular physical activity was discussed. The importance of compliance with medications discussed. Ideal goal is to keep blood pressure low elevated levels certainly below [id number] when possible. The patient was counseled that keeping blood pressure under control lessen his risk of heart attack, stroke, kidney failure, and early death. The importance of regular follow-ups was discussed with the patient. Low-salt diet such as DASH recommended. Regular physical activity was recommended as well. Patient was advised to keep regular follow-ups.   2. Gastroesophageal reflux disease without esophagitis Takes medication does well with therapy done take medicine he has difficult time  3. Other specified hypothyroidism Thyroid been under good control takes medication recent lab work reviewed  4. Hyperlipemia The patient was seen today as part of an evaluation regarding hyperlipidemia. Recent lab work has been reviewed with the patient as well as the goals for good cholesterol care. In addition to this medications have been discussed the importance of compliance with diet and medications discussed as well. Patient has been informed of potential side effects of medications in the importance to notify us should any problems occur. Finally the patient is aware that poor control of cholesterol, noncompliance can dramatically increase her risk of heart attack strokes and premature death. The patient will keep regular office visits and the patient does agreed to periodic lab work.  25 minutes was spent with the patient. Greater than half the time was spent in discussion and answering questions and counseling regarding the issues that the patient came in for today.

## 2015-09-20 DIAGNOSIS — J3089 Other allergic rhinitis: Secondary | ICD-10-CM | POA: Diagnosis not present

## 2015-10-04 DIAGNOSIS — J3089 Other allergic rhinitis: Secondary | ICD-10-CM | POA: Diagnosis not present

## 2015-10-19 ENCOUNTER — Encounter (HOSPITAL_COMMUNITY): Payer: Medicare Other

## 2015-10-19 ENCOUNTER — Encounter (HOSPITAL_COMMUNITY): Payer: Medicare Other | Attending: Hematology & Oncology | Admitting: Hematology & Oncology

## 2015-10-19 ENCOUNTER — Encounter (HOSPITAL_COMMUNITY): Payer: Self-pay | Admitting: Hematology & Oncology

## 2015-10-19 VITALS — BP 110/62 | HR 56 | Temp 98.0°F | Resp 20 | Wt 186.4 lb

## 2015-10-19 DIAGNOSIS — Z79899 Other long term (current) drug therapy: Secondary | ICD-10-CM | POA: Insufficient documentation

## 2015-10-19 DIAGNOSIS — J45909 Unspecified asthma, uncomplicated: Secondary | ICD-10-CM | POA: Insufficient documentation

## 2015-10-19 DIAGNOSIS — J449 Chronic obstructive pulmonary disease, unspecified: Secondary | ICD-10-CM | POA: Diagnosis not present

## 2015-10-19 DIAGNOSIS — D696 Thrombocytopenia, unspecified: Secondary | ICD-10-CM | POA: Diagnosis not present

## 2015-10-19 DIAGNOSIS — Z720 Tobacco use: Secondary | ICD-10-CM

## 2015-10-19 DIAGNOSIS — D45 Polycythemia vera: Secondary | ICD-10-CM

## 2015-10-19 DIAGNOSIS — Z9889 Other specified postprocedural states: Secondary | ICD-10-CM | POA: Diagnosis not present

## 2015-10-19 DIAGNOSIS — D751 Secondary polycythemia: Secondary | ICD-10-CM | POA: Insufficient documentation

## 2015-10-19 DIAGNOSIS — Z8249 Family history of ischemic heart disease and other diseases of the circulatory system: Secondary | ICD-10-CM | POA: Diagnosis not present

## 2015-10-19 DIAGNOSIS — J4520 Mild intermittent asthma, uncomplicated: Secondary | ICD-10-CM

## 2015-10-19 LAB — CBC WITH DIFFERENTIAL/PLATELET
Basophils Absolute: 0 10*3/uL (ref 0.0–0.1)
Basophils Relative: 0 %
EOS PCT: 3 %
Eosinophils Absolute: 0.2 10*3/uL (ref 0.0–0.7)
HCT: 49 % (ref 39.0–52.0)
Hemoglobin: 17.7 g/dL — ABNORMAL HIGH (ref 13.0–17.0)
LYMPHS ABS: 1.3 10*3/uL (ref 0.7–4.0)
LYMPHS PCT: 22 %
MCH: 33.8 pg (ref 26.0–34.0)
MCHC: 36.1 g/dL — ABNORMAL HIGH (ref 30.0–36.0)
MCV: 93.5 fL (ref 78.0–100.0)
MONO ABS: 0.5 10*3/uL (ref 0.1–1.0)
Monocytes Relative: 8 %
Neutro Abs: 3.9 10*3/uL (ref 1.7–7.7)
Neutrophils Relative %: 67 %
PLATELETS: 153 10*3/uL (ref 150–400)
RBC: 5.24 MIL/uL (ref 4.22–5.81)
RDW: 12.9 % (ref 11.5–15.5)
WBC: 6 10*3/uL (ref 4.0–10.5)

## 2015-10-19 LAB — COMPREHENSIVE METABOLIC PANEL
ALT: 40 U/L (ref 17–63)
AST: 35 U/L (ref 15–41)
Albumin: 4.1 g/dL (ref 3.5–5.0)
Alkaline Phosphatase: 65 U/L (ref 38–126)
Anion gap: 5 (ref 5–15)
BILIRUBIN TOTAL: 0.9 mg/dL (ref 0.3–1.2)
BUN: 15 mg/dL (ref 6–20)
CHLORIDE: 103 mmol/L (ref 101–111)
CO2: 29 mmol/L (ref 22–32)
CREATININE: 1 mg/dL (ref 0.61–1.24)
Calcium: 9 mg/dL (ref 8.9–10.3)
Glucose, Bld: 93 mg/dL (ref 65–99)
Potassium: 3.8 mmol/L (ref 3.5–5.1)
Sodium: 137 mmol/L (ref 135–145)
TOTAL PROTEIN: 6.8 g/dL (ref 6.5–8.1)

## 2015-10-19 LAB — FERRITIN: FERRITIN: 148 ng/mL (ref 24–336)

## 2015-10-19 NOTE — Patient Instructions (Signed)
[place] at [facility] Discharge Instructions  RECOMMENDATIONS MADE BY THE CONSULTANT AND ANY TEST RESULTS WILL BE SENT TO YOUR REFERRING PHYSICIAN.  You were seen by Dr. Whitney Muse today. Return to clinic in 6 months for follow-up and lab work. Call clinic with any questions or concerns.   Thank you for choosing [place] at [facility] to provide your oncology and hematology care.  To afford each patient quality time with our provider, please arrive at least 15 minutes before your scheduled appointment time.   Beginning January 23rd 2017 lab work for the [facility] will be done in the  Main lab at [facility] on 1st floor. If you have a lab appointment with the [place] please come in thru the  Main Entrance and check in at the main information desk  You need to re-schedule your appointment should you arrive 10 or more minutes late.  We strive to give you quality time with our providers, and arriving late affects you and other patients whose appointments are after yours.  Also, if you no show three or more times for appointments you may be dismissed from the clinic at the providers discretion.     Again, thank you for choosing [facility].  Our hope is that these requests will decrease the amount of time that you wait before being seen by our physicians.       _____________________________________________________________  Should you have questions after your visit to [facility], please contact our office at [phone] between the hours of 8:30 a.m. and 4:30 p.m.  Voicemails left after 4:30 p.m. will not be returned until the following business day.  For prescription refill requests, have your pharmacy contact our office.         Resources For Cancer Patients and their Caregivers ? American Cancer Society: Can assist with transportation, wigs, general needs, runs Look Good Feel Better.         ([phone] [phone] ? Cancer Care: Provides financial assistance, online support groups, medication/co-pay assistance.  1-800-813-HOPE (504) 429-9156) ? [place] Assists [place] cancer patients and their families through emotional , educational and financial support.  813-567-0928 ? [place] DSS Where to apply for food stamps, Medicaid and utility assistance. (620)662-8872 ? RCATS: Transportation to medical appointments. 7826502042 ? Social Security Administration: May apply for disability if have a Stage IV cancer. 204-675-3580 506-543-5495 ? [employer], Disability and Transit Services: Assists with nutrition, care and transit needs. [place] Support Programs: @10RELATIVEDAYS @ > Cancer Support Group  2nd Tuesday of the month 1pm-2pm, Journey Room  > Creative Journey  3rd Tuesday of the month 1130am-1pm, Journey Room  > Look Good Feel Better  1st Wednesday of the month 10am-12 noon, Journey Room (Call [place] to register [phone])

## 2015-10-19 NOTE — Progress Notes (Signed)
[place] at [facility] Progress Note  Patient Care Team: [name], MD as PCP - General (Family Medicine)  CHIEF COMPLAINTS/PURPOSE OF CONSULTATION:  Polycythemia, JAK-2 negative CALR, JAK 2 exon 12, MPL negative Asthma  HISTORY OF PRESENTING ILLNESS:  Thomas [name] 81 y.o. male is here because of JAK-2 negative polycythemia. He has never smoked but used chewing tobacco most of his life.  He is present alone today. 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 He has never been hospitalized for an asthma exacerbation.  Mr. Pullara is unaccompanied. I personally reviewed and went over laboratory studies with the patient.  He follows regularly with Dr. Wolfgang Phoenix.  He is doing well. He has been working. He has a quarter of an acre garden. His breathing has been fine without any coughing or wheezing. Everything is going well, "I have got my energy back and my strength". He denies any headaches, blurry vision, abdominal pain, or chest pain. He is not eating as much right now because it has been so hot. He has no complaints at this time.    MEDICAL HISTORY:  Past Medical History  Diagnosis Date  . Asthma   . Pollen allergies   . Kidney stones   . Hypertension   . Sleep apnea     mild no cpap  . Hernia   . History of stress test 2011  . Hypothyroidism   . GERD (gastroesophageal reflux disease)   . Renal calculus     some removed by Cystoscopy, one passed spontaneously  . H/O hiatal hernia   . Arthritis     "my entire back is gone"  . Polycythemia     SURGICAL HISTORY: Past Surgical History  Procedure Laterality Date  . Back surgery      L-4 , L-5  patient states that it was years ago.  Marland Kitchen Appendectomy      50 years  ago  . Colonoscopy      at age 82  . Upper gastrointestinal endoscopy      egd/ed  . Eye surgery      cataracts removed- bilateral- /w IOL  . Thyroidectomy Right 09/17/2012    Procedure: RIGHT HEMI-THYROIDECTOMY;  Surgeon: Ascencion Dike, MD;  Location: [facility] OR;  Service: ENT;  Laterality: Right;    SOCIAL HISTORY: Social History   Social History  . Marital Status: Widowed    Spouse Name: N/A  . Number of Children: N/A  . Years of Education: N/A   Occupational History  . Not on file.   Social History Main Topics  . Smoking status: Never Smoker   . Smokeless tobacco: Current User    Types: Chew     Comment: Patient states that he has been chewing for 60 plus years  . Alcohol Use: No     Comment: Patient states that he drinks a bout a case of beer a year  . Drug Use: No  . Sexual Activity: No   Other Topics Concern  . Not on file   Social History Narrative    FAMILY HISTORY: Family History  Problem Relation Age of Onset  . Heart disease Mother   . Heart disease Father   .  Heart disease Brother   . Healthy Son    indicated that his mother is deceased. He indicated that his father is deceased. He indicated that his sister is deceased. He indicated that his brother is deceased. He indicated that his son is alive. He indicated that his child is alive. He indicated that his other is alive.   ALLERGIES:  is allergic to penicillins.  MEDICATIONS:  Current Outpatient Prescriptions  Medication Sig Dispense Refill  . albuterol (PROVENTIL HFA;VENTOLIN HFA) 108 (90 BASE) MCG/ACT inhaler Inhale 2 puffs into the lungs every 4 (four) hours as needed. (Patient taking differently: Inhale 2 puffs into the lungs every 4 (four) hours as needed for wheezing or shortness of breath. ) 1 Inhaler 0  . budesonide-formoterol (SYMBICORT) 160-4.5 MCG/ACT inhaler Inhale 1 puff into the lungs 2 (two) times daily.     . fluticasone (FLONASE) 50 MCG/ACT nasal spray INHALE 1 SPRAY INTO EACH NOSTRIL TWICE  DAILY. 16 g 5  . gabapentin (NEURONTIN) 400 MG capsule Take 1 capsule (400 mg total) by mouth 3 (three) times daily. 90 capsule 12  . ketoconazole (NIZORAL) 2 % cream Apply 1 application topically 2 (two) times daily. 60 g 2  . levothyroxine (SYNTHROID, LEVOTHROID) 100 MCG tablet TAKE ONE TABLET BY MOUTH DAILY BEFORE BREAKFAST. 90 tablet 2  . omeprazole (PRILOSEC) 20 MG capsule Take 1 capsule (20 mg total) by mouth daily. 90 capsule 1  . pravastatin (PRAVACHOL) 40 MG tablet TAKE ONE TABLET AT BEDTIME AS DIRECTED. 90 tablet 1  . triamcinolone cream (KENALOG) 0.1 % Apply 1 application topically 2 (two) times daily. 45 g 2  . valsartan-hydrochlorothiazide (DIOVAN-HCT) 80-12.5 MG tablet Take 1 tablet by mouth daily. 90 tablet 1   No current facility-administered medications for this visit.    Review of Systems  Constitutional: Negative for fever, chills, weight loss and malaise/fatigue.  HENT: Negative for congestion, hearing loss, nosebleeds, sore throat and tinnitus.   Eyes: Negative for blurred vision, double vision, pain and discharge.  Respiratory: Negative for cough, hemoptysis, sputum production, shortness of breath and wheezing.   Cardiovascular: Negative for chest pain, palpitations, claudication, leg swelling and PND.  Gastrointestinal: Negative for heartburn, nausea, vomiting, abdominal pain, diarrhea, constipation, blood in stool and melena.  Genitourinary: Negative for dysuria, urgency, frequency and hematuria.  Musculoskeletal: Negative for myalgias, joint pain and falls.  Skin: Negative for itching and rash.  Neurological: Negative for dizziness, tingling, tremors, sensory change, speech change, focal weakness, seizures, loss of consciousness, weakness and headaches.  Endo/Heme/Allergies: Does not bruise/bleed easily.  Psychiatric/Behavioral: Negative for depression, suicidal ideas, memory loss and substance abuse. The patient is not nervous/anxious and does not have insomnia.     14 point ROS was done and is otherwise as detailed above or in HPI   PHYSICAL EXAMINATION: ECOG PERFORMANCE STATUS: 0 - Asymptomatic  Filed Vitals:   10/19/15 1338  BP: 110/62  Pulse: 56  Temp: 98 F (36.7 C)  Resp: 20   Filed Weights   10/19/15 1338  Weight: 186 lb 6.4 oz (84.55 kg)    Physical Exam  Constitutional: He is oriented to person, place, and time and well-developed, well-nourished, and in no distress.  Appears younger than stated age  HENT:  Head: Normocephalic and atraumatic.  Nose: Nose normal.  Mouth/Throat: Oropharynx is clear and moist. No oropharyngeal exudate.  Eyes: Conjunctivae and EOM are normal. Pupils are equal, round, and reactive to light. Right eye exhibits no discharge. Left eye exhibits no discharge. No  scleral icterus.  Neck: Normal range of motion. Neck supple. No tracheal deviation present. No thyromegaly present.  Cardiovascular: Normal rate, regular rhythm and normal heart sounds.  Exam reveals no gallop and no friction rub.   No murmur heard. Pulmonary/Chest: Effort normal and breath sounds normal. He has no wheezes. He has no rales.  Abdominal: Soft. Bowel sounds are normal. He exhibits no distension and no mass. There is no tenderness. There is no rebound and no guarding.  Musculoskeletal: Normal range of motion. He exhibits no edema.  Lymphadenopathy:    He has no cervical adenopathy.  Neurological: He is alert and oriented to person, place, and time. He has normal reflexes. No cranial nerve deficit. Gait normal. Coordination normal.  Skin: Skin is warm and dry. No rash noted.  Psychiatric: Mood, memory, affect and judgment normal.  Nursing note and vitals reviewed.   LABORATORY DATA:  I have reviewed the data as listed Lab Results  Component Value Date   WBC 6.0 10/19/2015   HGB 17.7* 10/19/2015   HCT 49.0 10/19/2015   MCV 93.5 10/19/2015   PLT 153 10/19/2015   Results for SHADY, BRADISH (MRN [id number])   Ref. Range  10/19/2015 12:30  Sodium Latest Ref Range: 135 - 145 mmol/L 137  Potassium Latest Ref Range: 3.5 - 5.1 mmol/L 3.8  Chloride Latest Ref Range: 101 - 111 mmol/L 103  CO2 Latest Ref Range: 22 - 32 mmol/L 29  BUN Latest Ref Range: 6 - 20 mg/dL 15  Creatinine Latest Ref Range: 0.61 - 1.24 mg/dL 1.00  Calcium Latest Ref Range: 8.9 - 10.3 mg/dL 9.0  EGFR (Non-African Amer.) Latest Ref Range: >60 mL/min >60  EGFR (African American) Latest Ref Range: >60 mL/min >60  Glucose Latest Ref Range: 65 - 99 mg/dL 93  Anion gap Latest Ref Range: 5 - 15  5  Alkaline Phosphatase Latest Ref Range: 38 - 126 U/L 65  Albumin Latest Ref Range: 3.5 - 5.0 g/dL 4.1  AST Latest Ref Range: 15 - 41 U/L 35  ALT Latest Ref Range: 17 - 63 U/L 40  Total Protein Latest Ref Range: 6.5 - 8.1 g/dL 6.8  Total Bilirubin Latest Ref Range: 0.3 - 1.2 mg/dL 0.9  Ferritin Latest Ref Range: 24 - 336 ng/mL 148     ASSESSMENT & PLAN:  Polycythemia, secondary JAK-2 V617 F mutation negative,JAK 2 EXON 12 mutation negative, MPL mutation negative, CALR mutation negative Asthma No history of cigarette use Thrombocytopenia  He has a history of asthma but does not appear to have her have been limited by his disease. He is very physically active. He only uses a Symbicort inhaler. He has never been hospitalized for an asthma exacerbation. He has never smoked. He has been tested for sleep apnea and was told it was negative. An erythropoietin level was checked last year and was 9.4 mIU/ml.  Testing for MPD is negative.   I personally reviewed and went over laboratory studies with the patient at length. We will continue with observation.  Based on stable results, we will extend his follow up to 6 months. If he begins to experience headaches or fatigue he will let us know.  All questions were answered. The patient knows to call the clinic with any problems, questions or concerns.   This document serves as a record of services personally  performed by Ancil Linsey, MD. It was created on her behalf by Arlyce Harman, a trained medical scribe. The creation of this record is  based on the scribe's personal observations and the provider's statements to them. This document has been checked and approved by the attending provider.  I have reviewed the above documentation for accuracy and completeness, and I agree with the above.  This note was electronically signed.   Kelby Fam. Vanissa Strength

## 2015-10-25 DIAGNOSIS — J3089 Other allergic rhinitis: Secondary | ICD-10-CM | POA: Diagnosis not present

## 2015-11-02 DIAGNOSIS — J3089 Other allergic rhinitis: Secondary | ICD-10-CM | POA: Diagnosis not present

## 2015-11-02 DIAGNOSIS — J4540 Moderate persistent asthma, uncomplicated: Secondary | ICD-10-CM | POA: Diagnosis not present

## 2015-11-02 DIAGNOSIS — K219 Gastro-esophageal reflux disease without esophagitis: Secondary | ICD-10-CM | POA: Diagnosis not present

## 2015-11-08 DIAGNOSIS — J3089 Other allergic rhinitis: Secondary | ICD-10-CM | POA: Diagnosis not present

## 2015-11-13 ENCOUNTER — Encounter (HOSPITAL_COMMUNITY): Payer: Self-pay | Admitting: Hematology & Oncology

## 2015-11-14 ENCOUNTER — Other Ambulatory Visit (HOSPITAL_COMMUNITY): Payer: Self-pay | Admitting: Hematology & Oncology

## 2015-11-14 ENCOUNTER — Encounter (HOSPITAL_COMMUNITY): Payer: Medicare Other

## 2015-11-14 ENCOUNTER — Telehealth (HOSPITAL_COMMUNITY): Payer: Self-pay

## 2015-11-14 DIAGNOSIS — D751 Secondary polycythemia: Secondary | ICD-10-CM | POA: Diagnosis not present

## 2015-11-14 DIAGNOSIS — Z79899 Other long term (current) drug therapy: Secondary | ICD-10-CM | POA: Diagnosis not present

## 2015-11-14 DIAGNOSIS — R519 Headache, unspecified: Secondary | ICD-10-CM

## 2015-11-14 DIAGNOSIS — J45909 Unspecified asthma, uncomplicated: Secondary | ICD-10-CM | POA: Diagnosis not present

## 2015-11-14 DIAGNOSIS — D45 Polycythemia vera: Secondary | ICD-10-CM

## 2015-11-14 DIAGNOSIS — R51 Headache: Secondary | ICD-10-CM

## 2015-11-14 DIAGNOSIS — Z8249 Family history of ischemic heart disease and other diseases of the circulatory system: Secondary | ICD-10-CM | POA: Diagnosis not present

## 2015-11-14 DIAGNOSIS — Z9889 Other specified postprocedural states: Secondary | ICD-10-CM | POA: Diagnosis not present

## 2015-11-14 DIAGNOSIS — D696 Thrombocytopenia, unspecified: Secondary | ICD-10-CM | POA: Diagnosis not present

## 2015-11-14 LAB — CBC WITH DIFFERENTIAL/PLATELET
BASOS ABS: 0 10*3/uL (ref 0.0–0.1)
Basophils Relative: 1 %
EOS ABS: 0.2 10*3/uL (ref 0.0–0.7)
EOS PCT: 3 %
HCT: 51.8 % (ref 39.0–52.0)
Hemoglobin: 18.1 g/dL — ABNORMAL HIGH (ref 13.0–17.0)
Lymphocytes Relative: 21 %
Lymphs Abs: 1.3 10*3/uL (ref 0.7–4.0)
MCH: 32.7 pg (ref 26.0–34.0)
MCHC: 34.9 g/dL (ref 30.0–36.0)
MCV: 93.7 fL (ref 78.0–100.0)
MONO ABS: 0.5 10*3/uL (ref 0.1–1.0)
Monocytes Relative: 7 %
Neutro Abs: 4.3 10*3/uL (ref 1.7–7.7)
Neutrophils Relative %: 68 %
PLATELETS: 157 10*3/uL (ref 150–400)
RBC: 5.53 MIL/uL (ref 4.22–5.81)
RDW: 13.1 % (ref 11.5–15.5)
WBC: 6.3 10*3/uL (ref 4.0–10.5)

## 2015-11-14 NOTE — Telephone Encounter (Signed)
See telephone encounter.

## 2015-11-15 ENCOUNTER — Ambulatory Visit (HOSPITAL_COMMUNITY)
Admission: RE | Admit: 2015-11-15 | Discharge: 2015-11-15 | Disposition: A | Discharge status: Home / Self Care | Payer: Medicare Other | Source: Ambulatory Visit | Attending: Hematology & Oncology | Admitting: Hematology & Oncology

## 2015-11-15 DIAGNOSIS — R51 Headache: Secondary | ICD-10-CM | POA: Diagnosis not present

## 2015-11-15 DIAGNOSIS — D45 Polycythemia vera: Secondary | ICD-10-CM | POA: Insufficient documentation

## 2015-11-15 DIAGNOSIS — G319 Degenerative disease of nervous system, unspecified: Secondary | ICD-10-CM | POA: Diagnosis not present

## 2015-11-15 DIAGNOSIS — R519 Headache, unspecified: Secondary | ICD-10-CM

## 2015-11-15 MED ORDER — IOPAMIDOL (ISOVUE-300) INJECTION 61%
75.0000 mL | Freq: Once | INTRAVENOUS | Status: AC | PRN
  Administered 2015-11-15: 75 mL via INTRAVENOUS

## 2015-11-21 ENCOUNTER — Other Ambulatory Visit (HOSPITAL_COMMUNITY): Payer: Self-pay | Admitting: Oncology

## 2015-11-21 DIAGNOSIS — D45 Polycythemia vera: Secondary | ICD-10-CM

## 2015-11-22 ENCOUNTER — Encounter (HOSPITAL_COMMUNITY)
Admission: RE | Admit: 2015-11-22 | Discharge: 2015-11-22 | Disposition: A | Discharge status: Home / Self Care | Payer: Medicare Other | Source: Ambulatory Visit | Attending: Hematology & Oncology | Admitting: Hematology & Oncology

## 2015-11-22 ENCOUNTER — Encounter (HOSPITAL_COMMUNITY): Payer: Self-pay

## 2015-11-22 DIAGNOSIS — D45 Polycythemia vera: Secondary | ICD-10-CM | POA: Insufficient documentation

## 2015-11-22 NOTE — Discharge Instructions (Signed)
Therapeutic Phlebotomy, Care After  Refer to this sheet in the next few weeks. These instructions provide you with information about caring for yourself after your procedure. Your health care provider may also give you more specific instructions. Your treatment has been planned according to current medical practices, but problems sometimes occur. Call your health care provider if you have any problems or questions after your procedure.  WHAT TO EXPECT AFTER THE PROCEDURE  After your procedure, it is common to have:   Light-headedness or dizziness. You may feel faint.   Nausea.   Tiredness.  HOME CARE INSTRUCTIONS  Activities   Return to your normal activities as directed by your health care provider. Most people can go back to their normal activities right away.   Avoid strenuous physical activity and heavy lifting or pulling for about 5 hours after the procedure. Do not lift anything that is heavier than 10 lb (4.5 kg).   Athletes should avoid strenuous exercise for at least 12 hours.   Change positions slowly for the remainder of the day. This will help to prevent light-headedness or fainting.   If you feel light-headed, lie down until the feeling goes away.  Eating and Drinking   Be sure to eat well-balanced meals for the next 24 hours.   Drink enough fluid to keep your urine clear or pale yellow.   Avoid drinking alcohol on the day that you had the procedure.  Care of the Needle Insertion Site   Keep your bandage dry. You can remove the bandage after about 5 hours or as directed by your health care provider.   If you have bleeding from the needle insertion site, elevate your arm and press firmly on the site until the bleeding stops.   If you have bruising at the site, apply ice to the area:   Put ice in a plastic bag.   Place a towel between your skin and the bag.   Leave the ice on for 20 minutes, 2-3 times a day for the first 24 hours.   If the swelling does not go away after 24 hours, apply  a warm, moist washcloth to the area for 20 minutes, 2-3 times a day.  General Instructions   Avoid smoking for at least 30 minutes after the procedure.   Keep all follow-up visits as directed by your health care provider. It is important to continue with further therapeutic phlebotomy treatments as directed.  SEEK MEDICAL CARE IF:   You have redness, swelling, or pain at the needle insertion site.   You have fluid, blood, or pus coming from the needle insertion site.   You feel light-headed, dizzy, or nauseated, and the feeling does not go away.   You notice new bruising at the needle insertion site.   You feel weaker than normal.   You have a fever or chills.  SEEK IMMEDIATE MEDICAL CARE IF:   You have severe nausea or vomiting.   You have chest pain.   You have trouble breathing.    This information is not intended to replace advice given to you by your health care provider. Make sure you discuss any questions you have with your health care provider.    Document Released: 09/04/2010 Document Revised: 08/17/2014 Document Reviewed: 03/29/2014  Elsevier Interactive Patient Education 2016 [employer]

## 2015-11-22 NOTE — Progress Notes (Signed)
Thomas [name] presents today for phlebotomy per MD orders. HGB/HCT: 18.1/ 51.8 Phlebotomy procedure started at 1222 and ended at 1233 19 ounces removed. Patient tolerated procedure with lightheadness near the end. Resolved after completion of therapy. No changes noted in vital signs. IV needle removed intact.

## 2015-11-29 DIAGNOSIS — J3089 Other allergic rhinitis: Secondary | ICD-10-CM | POA: Diagnosis not present

## 2015-12-06 DIAGNOSIS — J3089 Other allergic rhinitis: Secondary | ICD-10-CM | POA: Diagnosis not present

## 2015-12-13 DIAGNOSIS — J3089 Other allergic rhinitis: Secondary | ICD-10-CM | POA: Diagnosis not present

## 2015-12-20 DIAGNOSIS — J3089 Other allergic rhinitis: Secondary | ICD-10-CM | POA: Diagnosis not present

## 2015-12-28 DIAGNOSIS — R0982 Postnasal drip: Secondary | ICD-10-CM | POA: Diagnosis not present

## 2015-12-28 DIAGNOSIS — R070 Pain in throat: Secondary | ICD-10-CM | POA: Diagnosis not present

## 2015-12-28 DIAGNOSIS — J310 Chronic rhinitis: Secondary | ICD-10-CM | POA: Diagnosis not present

## 2015-12-28 DIAGNOSIS — J343 Hypertrophy of nasal turbinates: Secondary | ICD-10-CM | POA: Diagnosis not present

## 2016-01-03 DIAGNOSIS — J3089 Other allergic rhinitis: Secondary | ICD-10-CM | POA: Diagnosis not present

## 2016-01-04 ENCOUNTER — Ambulatory Visit (INDEPENDENT_AMBULATORY_CARE_PROVIDER_SITE_OTHER): Payer: Medicare Other | Admitting: Family Medicine

## 2016-01-04 ENCOUNTER — Encounter: Payer: Self-pay | Admitting: Family Medicine

## 2016-01-04 VITALS — BP 110/70 | Temp 98.4°F | Ht 68.0 in | Wt 182.5 lb

## 2016-01-04 DIAGNOSIS — I10 Essential (primary) hypertension: Secondary | ICD-10-CM

## 2016-01-04 DIAGNOSIS — L989 Disorder of the skin and subcutaneous tissue, unspecified: Secondary | ICD-10-CM

## 2016-01-04 NOTE — Progress Notes (Signed)
   Subjective:    Patient ID: Thomas [name], male    DOB: Jul 10, 1934, 81 y.o.   MRN: [id number]  Rash  This is a new problem. The current episode started in the past 7 days. The problem is unchanged. The affected locations include the head. Rash characteristics: spot on head. He was exposed to nothing. Pertinent negatives include no cough, fatigue, fever or shortness of breath. Past treatments include nothing. The treatment provided no relief.   Patient has no other concerns at this time.  The patient states that he did see oncology. They did recommend drawing off some blood due to polycythemia. He is actually doing better now. Has additional lab work coming up when he sees them.  Review of Systems  Constitutional: Negative for activity change, fatigue and fever.  Respiratory: Negative for cough and shortness of breath.   Cardiovascular: Negative for chest pain and leg swelling.  Skin: Positive for rash.  Neurological: Negative for headaches.       Objective:   Physical Exam  Constitutional: He appears well-nourished. No distress.  Cardiovascular: Normal rate, regular rhythm and normal heart sounds.   No murmur heard. Pulmonary/Chest: Effort normal and breath sounds normal. No respiratory distress.  Musculoskeletal: He exhibits no edema.  Lymphadenopathy:    He has no cervical adenopathy.  Neurological: He is alert.  Psychiatric: His behavior is normal.  Vitals reviewed.  The top of his head has a skin lesion that is erythematous and crusting a be a seborrheic keratosis but it could also be a early skin cancer. The edges of the skin lesion does have inflamed appearance  Blood pressure under good control on medications watching diet taking his Kaletra cholesterol medicine tolerating it well     Assessment & Plan:  Skin lesion referral to dermatology may need shave removal.  Asthma stable on current medicines patient is following up in November-flu vaccine at that time

## 2016-01-10 DIAGNOSIS — H26492 Other secondary cataract, left eye: Secondary | ICD-10-CM | POA: Diagnosis not present

## 2016-01-17 DIAGNOSIS — L820 Inflamed seborrheic keratosis: Secondary | ICD-10-CM | POA: Diagnosis not present

## 2016-01-28 ENCOUNTER — Other Ambulatory Visit: Payer: Self-pay | Admitting: Family Medicine

## 2016-01-30 DIAGNOSIS — H903 Sensorineural hearing loss, bilateral: Secondary | ICD-10-CM | POA: Diagnosis not present

## 2016-01-30 DIAGNOSIS — Z01118 Encounter for examination of ears and hearing with other abnormal findings: Secondary | ICD-10-CM | POA: Diagnosis not present

## 2016-01-31 DIAGNOSIS — J309 Allergic rhinitis, unspecified: Secondary | ICD-10-CM | POA: Diagnosis not present

## 2016-01-31 DIAGNOSIS — Z23 Encounter for immunization: Secondary | ICD-10-CM | POA: Diagnosis not present

## 2016-02-02 DIAGNOSIS — J3089 Other allergic rhinitis: Secondary | ICD-10-CM | POA: Diagnosis not present

## 2016-02-02 DIAGNOSIS — J301 Allergic rhinitis due to pollen: Secondary | ICD-10-CM | POA: Diagnosis not present

## 2016-02-07 ENCOUNTER — Encounter (HOSPITAL_COMMUNITY): Payer: Self-pay | Admitting: *Deleted

## 2016-02-07 ENCOUNTER — Encounter (HOSPITAL_COMMUNITY)
Admission: RE | Disposition: A | Discharge status: Home / Self Care | Payer: Self-pay | Source: Ambulatory Visit | Attending: Ophthalmology

## 2016-02-07 ENCOUNTER — Ambulatory Visit (HOSPITAL_COMMUNITY)
Admission: RE | Admit: 2016-02-07 | Discharge: 2016-02-07 | Disposition: A | Discharge status: Home / Self Care | Payer: Medicare Other | Source: Ambulatory Visit | Attending: Ophthalmology | Admitting: Ophthalmology

## 2016-02-07 DIAGNOSIS — I10 Essential (primary) hypertension: Secondary | ICD-10-CM | POA: Insufficient documentation

## 2016-02-07 DIAGNOSIS — H26492 Other secondary cataract, left eye: Secondary | ICD-10-CM | POA: Diagnosis not present

## 2016-02-07 DIAGNOSIS — H2640 Unspecified secondary cataract: Secondary | ICD-10-CM | POA: Insufficient documentation

## 2016-02-07 DIAGNOSIS — M1990 Unspecified osteoarthritis, unspecified site: Secondary | ICD-10-CM | POA: Insufficient documentation

## 2016-02-07 HISTORY — PX: YAG LASER APPLICATION: SHX6189

## 2016-02-07 SURGERY — TREATMENT, USING YAG LASER
Anesthesia: LOCAL | Laterality: Left

## 2016-02-07 MED ORDER — CYCLOPENTOLATE-PHENYLEPHRINE 0.2-1 % OP SOLN
1.0000 [drp] | OPHTHALMIC | Status: AC
  Administered 2016-02-07 (×2): 1 [drp] via OPHTHALMIC
  Filled 2016-02-07: qty 2

## 2016-02-07 NOTE — H&P (Signed)
The patient was re examined and there is no change in the patients condition since the original H and P. 

## 2016-02-07 NOTE — Op Note (Signed)
Khaalid Lefkowitz T. Gershon Crane, MD  Procedure: Yag Capsulotomy  Yag Laser Self Test Completedyes. Procedure: Posterior Capsulotomy, Eye Protection Worn by Staff yes. Laser In Use Sign on Door yes.  Laser: Nd:YAG Spot Size: Fixed Burst Mode: III Power Setting: 3.4 mJ/burst Number of shots: 20 Total energy delivered: 64.20 mJ   The patient tolerated the procedure without difficulty. No complications were encountered.   The patient was discharged home with the instructions to continue all her current glaucoma medications, if any.   Patient instructed to go to office at 0100 for intraocular pressure check.  Patient verbalizes understanding of discharge instructions Yes.  .    Pre-Operative Diagnosis: After-Cataract, obscuring vision, 366.53 OS Post-Operative Diagnosis: After-Cataract, obscuring vision, 366.53 OS Date of Cataract Surgery: Unknown

## 2016-02-07 NOTE — Discharge Instructions (Signed)
Thomas [name]  02/07/2016     Instructions    Activity: No Restrictions.   Diet: Resume Diet you were on at home.   Pain Medication: Tylenol if Needed.   CONTACT YOUR DOCTOR IF YOU HAVE PAIN, REDNESS IN YOUR EYE, OR DECREASED VISION.   Follow-up:1:00 pm with Rutherford Guys, MD.   Dr. Gershon Crane: [phone]    If you find that you cannot contact your physician, but feel that your signs and   Symptoms warrant a physician's attention, call the Emergency Room at   [phone] [phone].

## 2016-02-09 ENCOUNTER — Encounter (HOSPITAL_COMMUNITY): Payer: Self-pay | Admitting: Ophthalmology

## 2016-02-13 ENCOUNTER — Encounter: Payer: Self-pay | Admitting: Family Medicine

## 2016-02-13 ENCOUNTER — Other Ambulatory Visit: Payer: Self-pay | Admitting: *Deleted

## 2016-02-13 ENCOUNTER — Ambulatory Visit (HOSPITAL_COMMUNITY)
Admission: RE | Admit: 2016-02-13 | Discharge: 2016-02-13 | Disposition: A | Discharge status: Home / Self Care | Payer: Medicare Other | Source: Ambulatory Visit | Attending: Family Medicine | Admitting: Family Medicine

## 2016-02-13 ENCOUNTER — Other Ambulatory Visit (HOSPITAL_COMMUNITY)
Admission: RE | Admit: 2016-02-13 | Discharge: 2016-02-13 | Disposition: A | Discharge status: Home / Self Care | Payer: Medicare Other | Source: Ambulatory Visit | Attending: Family Medicine | Admitting: Family Medicine

## 2016-02-13 ENCOUNTER — Ambulatory Visit (INDEPENDENT_AMBULATORY_CARE_PROVIDER_SITE_OTHER): Payer: Medicare Other | Admitting: Family Medicine

## 2016-02-13 VITALS — BP 122/86 | Temp 98.5°F | Ht 68.0 in | Wt 183.6 lb

## 2016-02-13 DIAGNOSIS — R933 Abnormal findings on diagnostic imaging of other parts of digestive tract: Secondary | ICD-10-CM | POA: Insufficient documentation

## 2016-02-13 DIAGNOSIS — R1032 Left lower quadrant pain: Secondary | ICD-10-CM

## 2016-02-13 DIAGNOSIS — N400 Enlarged prostate without lower urinary tract symptoms: Secondary | ICD-10-CM | POA: Insufficient documentation

## 2016-02-13 DIAGNOSIS — R109 Unspecified abdominal pain: Secondary | ICD-10-CM | POA: Insufficient documentation

## 2016-02-13 DIAGNOSIS — K4090 Unilateral inguinal hernia, without obstruction or gangrene, not specified as recurrent: Secondary | ICD-10-CM | POA: Diagnosis not present

## 2016-02-13 DIAGNOSIS — I700 Atherosclerosis of aorta: Secondary | ICD-10-CM | POA: Insufficient documentation

## 2016-02-13 LAB — LIPASE, BLOOD: LIPASE: 15 U/L (ref 11–51)

## 2016-02-13 LAB — CBC WITH DIFFERENTIAL/PLATELET
BASOS PCT: 0 %
Basophils Absolute: 0 10*3/uL (ref 0.0–0.1)
Eosinophils Absolute: 0.2 10*3/uL (ref 0.0–0.7)
Eosinophils Relative: 2 %
HEMATOCRIT: 52.9 % — AB (ref 39.0–52.0)
Hemoglobin: 18.8 g/dL — ABNORMAL HIGH (ref 13.0–17.0)
LYMPHS ABS: 1.5 10*3/uL (ref 0.7–4.0)
Lymphocytes Relative: 19 %
MCH: 34 pg (ref 26.0–34.0)
MCHC: 35.5 g/dL (ref 30.0–36.0)
MCV: 95.7 fL (ref 78.0–100.0)
MONO ABS: 0.5 10*3/uL (ref 0.1–1.0)
MONOS PCT: 6 %
NEUTROS ABS: 5.6 10*3/uL (ref 1.7–7.7)
Neutrophils Relative %: 73 %
Platelets: 177 10*3/uL (ref 150–400)
RBC: 5.53 MIL/uL (ref 4.22–5.81)
RDW: 13.1 % (ref 11.5–15.5)
WBC: 7.7 10*3/uL (ref 4.0–10.5)

## 2016-02-13 LAB — COMPREHENSIVE METABOLIC PANEL
ALBUMIN: 4.4 g/dL (ref 3.5–5.0)
ALT: 56 U/L (ref 17–63)
ANION GAP: 7 (ref 5–15)
AST: 48 U/L — ABNORMAL HIGH (ref 15–41)
Alkaline Phosphatase: 77 U/L (ref 38–126)
BILIRUBIN TOTAL: 1.4 mg/dL — AB (ref 0.3–1.2)
BUN: 12 mg/dL (ref 6–20)
CALCIUM: 9.4 mg/dL (ref 8.9–10.3)
CO2: 30 mmol/L (ref 22–32)
Chloride: 100 mmol/L — ABNORMAL LOW (ref 101–111)
Creatinine, Ser: 1.03 mg/dL (ref 0.61–1.24)
GLUCOSE: 98 mg/dL (ref 65–99)
POTASSIUM: 4.2 mmol/L (ref 3.5–5.1)
Sodium: 137 mmol/L (ref 135–145)
TOTAL PROTEIN: 7.9 g/dL (ref 6.5–8.1)

## 2016-02-13 MED ORDER — METRONIDAZOLE 500 MG PO TABS: 500.0000 mg | ORAL_TABLET | Freq: Three times a day (TID) | ORAL | 0 refills | Status: DC

## 2016-02-13 MED ORDER — IOPAMIDOL (ISOVUE-300) INJECTION 61%
100.0000 mL | Freq: Once | INTRAVENOUS | Status: AC | PRN
  Administered 2016-02-13: 100 mL via INTRAVENOUS

## 2016-02-13 MED ORDER — CIPROFLOXACIN HCL 500 MG PO TABS: 500.0000 mg | ORAL_TABLET | Freq: Two times a day (BID) | ORAL | 0 refills | Status: DC

## 2016-02-13 NOTE — Progress Notes (Signed)
   Subjective:    Patient ID: Thomas [name], male    DOB: July 19, 1934, 81 y.o.   MRN: [id number]  HPI Patient arrives with c/o abdominal pain for one week. Got worse over the weekend and now feels a knot in the bottom of his stomach This patient states that he had onset of abdominal pain Thursday evening into Friday morning he states it is mainly in the midportion of the abdomen got worse over the weekend. Localized into the left lower quadrant. He also noticed some enlargement in the left lower quadrant as well. Patient with some nausea no vomiting appetite is been subpar pain does wake him up at times at night he denies dysuria denies rectal bleeding hematuria he denies sweats and chills. Patient has had a CAT scan several years ago which showed diverticula Review of Systems Please see above. No high fevers. No vomiting. Positive for nausea positive for severe abdominal pain. Denies chest tightness shortness of breath    Objective:   Physical Exam Lungs are clear no crackles heart is regular pulse normal abdomen is soft with some severe left lower quadrant and right lower quadrant tenderness. Mild Harding no rebound. Also has large hernia in the left lower inguinal area that does not appear to be incarcerated  Stat CAT scan is necessary as well as stat lab work.     Assessment & Plan:  Severe abdominal pain with recent onset with his history we need to rule out the possibility of a diverticulitis. He needs a stat CT scan of the abdomen and pelvis with contrast he also need stat lab work. Patient did not want to go to the ER. Await the results of this testing.

## 2016-02-13 NOTE — Progress Notes (Unsigned)
x

## 2016-02-15 ENCOUNTER — Encounter: Payer: Self-pay | Admitting: Family Medicine

## 2016-02-15 ENCOUNTER — Ambulatory Visit (INDEPENDENT_AMBULATORY_CARE_PROVIDER_SITE_OTHER): Payer: Medicare Other | Admitting: Family Medicine

## 2016-02-15 VITALS — BP 118/76 | Ht 68.0 in | Wt 183.2 lb

## 2016-02-15 DIAGNOSIS — I10 Essential (primary) hypertension: Secondary | ICD-10-CM | POA: Diagnosis not present

## 2016-02-15 DIAGNOSIS — D45 Polycythemia vera: Secondary | ICD-10-CM

## 2016-02-15 DIAGNOSIS — R935 Abnormal findings on diagnostic imaging of other abdominal regions, including retroperitoneum: Secondary | ICD-10-CM | POA: Diagnosis not present

## 2016-02-15 NOTE — Progress Notes (Signed)
   Subjective:    Patient ID: Thomas [name], male    DOB: 29-Sep-1934, 81 y.o.   MRN: [id number]  HPI  Patient arrives for a recheck on left lower quadrant abdominal pain.  Patient states he is feeling better.Patient relates some antibiotics are helping. Less tenderness less soreness no fever chills or vomiting no diarrhea.  The results of the CAT scan was reviewed with the patient regarding how the area diverticulitis also shows a possibility of a underlying thickening of the colon which needs further looking into I recommend referral to gastroenterology for further evaluation for probable colonoscopy  His blood work did show that his hemoglobin was up to 18.8 we will send the hematology to have them consider seeing the patient sooner regarding the polycythemia issue. Review of Systems    patient denies any chest tightness pressure pain shortness breath nausea vomiting diarrhea denies fever chills states he abdominal pain is doing better Objective:   Physical Exam Lungs are clear hearts regular abdomen soft no guarding rebound minimal tenderness in the lower abdomen much improved compared where he was extremities no edema       Assessment & Plan:  Abnormal CAT scan results referral to gastroenterology for probable follow-up colonoscopy  Diverticulitis doing better with the antibiotics continue the antibiotics for the full course  Will bring recent elevated hemoglobin to the attention of hematology he has an appointment in January they may want to move this up.  Patient will do a standard lab work in a few weeks time and follow-up with [place] in several weeks

## 2016-02-16 ENCOUNTER — Other Ambulatory Visit: Payer: Self-pay | Admitting: *Deleted

## 2016-02-16 DIAGNOSIS — E785 Hyperlipidemia, unspecified: Secondary | ICD-10-CM

## 2016-02-16 DIAGNOSIS — E039 Hypothyroidism, unspecified: Secondary | ICD-10-CM

## 2016-02-16 NOTE — Progress Notes (Signed)
bw orders put in.  

## 2016-02-21 ENCOUNTER — Other Ambulatory Visit (INDEPENDENT_AMBULATORY_CARE_PROVIDER_SITE_OTHER): Payer: Self-pay | Admitting: Internal Medicine

## 2016-02-21 ENCOUNTER — Telehealth (INDEPENDENT_AMBULATORY_CARE_PROVIDER_SITE_OTHER): Payer: Self-pay | Admitting: *Deleted

## 2016-02-21 ENCOUNTER — Encounter (INDEPENDENT_AMBULATORY_CARE_PROVIDER_SITE_OTHER): Payer: Self-pay | Admitting: Internal Medicine

## 2016-02-21 ENCOUNTER — Encounter (INDEPENDENT_AMBULATORY_CARE_PROVIDER_SITE_OTHER): Payer: Self-pay | Admitting: *Deleted

## 2016-02-21 ENCOUNTER — Ambulatory Visit (INDEPENDENT_AMBULATORY_CARE_PROVIDER_SITE_OTHER): Payer: Medicare Other | Admitting: Internal Medicine

## 2016-02-21 VITALS — BP 130/70 | HR 64 | Temp 98.1°F | Ht 68.0 in | Wt 184.1 lb

## 2016-02-21 DIAGNOSIS — K5732 Diverticulitis of large intestine without perforation or abscess without bleeding: Secondary | ICD-10-CM | POA: Diagnosis not present

## 2016-02-21 DIAGNOSIS — J309 Allergic rhinitis, unspecified: Secondary | ICD-10-CM | POA: Diagnosis not present

## 2016-02-21 DIAGNOSIS — R935 Abnormal findings on diagnostic imaging of other abdominal regions, including retroperitoneum: Secondary | ICD-10-CM | POA: Insufficient documentation

## 2016-02-21 NOTE — Progress Notes (Signed)
Subjective:    Patient ID: Thomas [name], male    DOB: Feb 12, 1935, 81 y.o.   MRN: [id number]  HPI Referred to our office by Dr.Luking for abnormal CT abdomen. Underwent a CT last of October which showed possible diverticulitis.  Treated with Cipro and flagyl. ? Mass like appearance of the wall thickening, recommended colonoscopy. He had similar symptoms about a year ago and was given an antibiotic.  He tells me he feels better since starting the antibiotics. He is 99% better.  His appetite is good. No weight loss. He is having a BM daily. No melena or BRRB.  Last colonoscopy in 2007.  Hx of Polycythemia and followed by Dr. Whitney Muse.  02/13/2016 CT abdomen/pelvis with CM: LLQ pain, Nausea and vomiting.  IMPRESSION: 1. Focal area of wall thickening within the mid sigmoid colon with associated mild inflammatory stranding in the adjacent fat. This may indicate acute diverticulitis. However, given the somewhat masslike appearance of the wall thickening, follow-up with colonoscopy is recommended once the acute symptoms have resolved to exclude the possibility of a colonic neoplasm. 2. Enlarged prostate. 3. Aortic atherosclerosis.   06/2005 Colonoscopy with polypectomy: FINAL DIAGNOSES:  1. A 5-to-6-mm polyp snared from the cecum.  2. A few tiny diverticula at the sigmoid colon.  Biopsy: 1) polyp (Cecum, polypectomy): SMALL INTRAMUCOSAL LEIOMYOMA WITH INFLAMMATION OF OVERLYING MUCOSA. SEE MICROSCOPIC.  Review of Systems Past Medical History:  Diagnosis Date  . Arthritis    "my entire back is gone"  . Asthma   . GERD (gastroesophageal reflux disease)   . H/O hiatal hernia   . Hernia   . History of stress test 2011  . Hypertension   . Hypothyroidism   . Kidney stones   . Pollen allergies   . Polycythemia   . Renal calculus    some removed by Cystoscopy, one passed spontaneously  . Sleep apnea    mild no cpap    Past Surgical History:  Procedure Laterality Date  .  APPENDECTOMY     50 years ago  . BACK SURGERY     L-4 , L-5  patient states that it was years ago.  Marland Kitchen COLONOSCOPY     at age 84  . EYE SURGERY     cataracts removed- bilateral- /w IOL  . THYROIDECTOMY Right 09/17/2012   Procedure: RIGHT HEMI-THYROIDECTOMY;  Surgeon: Ascencion Dike, MD;  Location: [facility];  Service: ENT;  Laterality: Right;  . UPPER GASTROINTESTINAL ENDOSCOPY     egd/ed  . YAG LASER APPLICATION Left [id number]   Procedure: YAG LASER APPLICATION;  Surgeon: Rutherford Guys, MD;  Location: [facility];  Service: Ophthalmology;  Laterality: Left;    Allergies  Allergen Reactions  . Penicillins Other (See Comments)    Reaction many years ago-unknown reaction Has patient had a PCN reaction causing immediate rash, facial/tongue/throat swelling, SOB or lightheadedness with hypotension: Unknown Has patient had a PCN reaction causing severe rash involving mucus membranes or skin necrosis: Unknown Has patient had a PCN reaction that required hospitalization Unknown Has patient had a PCN reaction occurring within the last 10 years: No If all of the above answers are "NO", then may proceed with Cephalosporin use.     Current Outpatient Prescriptions on File Prior to Visit  Medication Sig Dispense Refill  . albuterol (PROVENTIL HFA;VENTOLIN HFA) 108 (90 BASE) MCG/ACT inhaler Inhale 2 puffs into the lungs every 4 (four) hours as needed. (Patient taking differently: Inhale 2 puffs into the lungs every 4 (  four) hours as needed for wheezing or shortness of breath. ) 1 Inhaler 0  . budesonide-formoterol (SYMBICORT) 80-4.5 MCG/ACT inhaler Inhale 1 puff into the lungs 2 (two) times daily.    . ciprofloxacin (CIPRO) 500 MG tablet Take 1 tablet (500 mg total) by mouth 2 (two) times daily. 20 tablet 0  . fluticasone (FLONASE) 50 MCG/ACT nasal spray INHALE 1 SPRAY INTO EACH NOSTRIL TWICE DAILY. 16 g 0  . gabapentin (NEURONTIN) 400 MG capsule Take 1 capsule (400 mg total) by mouth 3 (three) times daily. 90  capsule 12  . Hypromellose (ARTIFICIAL TEARS OP) Apply 1 drop to eye daily as needed (dry eyes).    Marland Kitchen ibuprofen (ADVIL,MOTRIN) 200 MG tablet Take 400 mg by mouth 2 (two) times daily as needed for headache or moderate pain.    Marland Kitchen levothyroxine (SYNTHROID, LEVOTHROID) 100 MCG tablet TAKE ONE TABLET BY MOUTH DAILY BEFORE BREAKFAST. 90 tablet 2  . omeprazole (PRILOSEC) 20 MG capsule Take 1 capsule (20 mg total) by mouth daily. 90 capsule 1  . pravastatin (PRAVACHOL) 40 MG tablet TAKE ONE TABLET AT BEDTIME AS DIRECTED. 90 tablet 1  . triamcinolone cream (KENALOG) 0.1 % Apply 1 application topically 2 (two) times daily. (Patient taking differently: Apply 1 application topically 2 (two) times daily as needed (rash). ) 45 g 2  . valsartan-hydrochlorothiazide (DIOVAN-HCT) 80-12.5 MG tablet TAKE ONE TABLET BY MOUTH ONCE DAILY. 90 tablet 0   No current facility-administered medications on file prior to visit.        Objective:   Physical Exam Blood pressure 130/70, pulse 64, temperature 98.1 F (36.7 C), height 5\' 8"  (1.727 m), weight 184 lb 1.6 oz (83.5 kg).  Alert and oriented. Skin warm and dry. Oral mucosa is moist.   . Sclera anicteric, conjunctivae is pink. Thyroid not enlarged. No cervical lymphadenopathy. Lungs clear. Heart regular rate and rhythm.  Abdomen is soft. Bowel sounds are positive. No hepatomegaly. No abdominal masses felt. No tenderness.  No edema to lower extremities.         Assessment & Plan:  Abnormal CT. Colonic neoplasm needs to be ruled out. Will schedule colonoscopy at leas 6 weeks out.

## 2016-02-21 NOTE — Telephone Encounter (Signed)
Patient needs trilyte 

## 2016-02-21 NOTE — Patient Instructions (Signed)
Colonoscopy.  The risks and benefits such as perforation, bleeding, and infection were reviewed with the patient and is agreeable. 

## 2016-02-22 MED ORDER — PEG 3350-KCL-NA BICARB-NACL 420 G PO SOLR: 4000.0000 mL | Freq: Once | ORAL | 0 refills | Status: AC

## 2016-02-28 DIAGNOSIS — J309 Allergic rhinitis, unspecified: Secondary | ICD-10-CM | POA: Diagnosis not present

## 2016-03-06 DIAGNOSIS — E785 Hyperlipidemia, unspecified: Secondary | ICD-10-CM | POA: Diagnosis not present

## 2016-03-06 DIAGNOSIS — J309 Allergic rhinitis, unspecified: Secondary | ICD-10-CM | POA: Diagnosis not present

## 2016-03-06 DIAGNOSIS — E039 Hypothyroidism, unspecified: Secondary | ICD-10-CM | POA: Diagnosis not present

## 2016-03-07 LAB — LIPID PANEL
CHOLESTEROL TOTAL: 157 mg/dL (ref 100–199)
Chol/HDL Ratio: 3.9 ratio units (ref 0.0–5.0)
HDL: 40 mg/dL (ref >39)
LDL Calculated: 86 mg/dL (ref 0–99)
Triglycerides: 157 mg/dL — ABNORMAL HIGH (ref 0–149)
VLDL CHOLESTEROL CAL: 31 mg/dL (ref 5–40)

## 2016-03-07 LAB — TSH: TSH: 1.21 u[IU]/mL (ref 0.450–4.500)

## 2016-03-15 ENCOUNTER — Ambulatory Visit (INDEPENDENT_AMBULATORY_CARE_PROVIDER_SITE_OTHER): Payer: Medicare Other | Admitting: Family Medicine

## 2016-03-15 ENCOUNTER — Encounter: Payer: Self-pay | Admitting: Family Medicine

## 2016-03-15 VITALS — BP 112/76 | Ht 68.0 in | Wt 183.0 lb

## 2016-03-15 DIAGNOSIS — E784 Other hyperlipidemia: Secondary | ICD-10-CM | POA: Diagnosis not present

## 2016-03-15 DIAGNOSIS — K219 Gastro-esophageal reflux disease without esophagitis: Secondary | ICD-10-CM | POA: Diagnosis not present

## 2016-03-15 DIAGNOSIS — R1032 Left lower quadrant pain: Secondary | ICD-10-CM

## 2016-03-15 DIAGNOSIS — I10 Essential (primary) hypertension: Secondary | ICD-10-CM | POA: Diagnosis not present

## 2016-03-15 DIAGNOSIS — E038 Other specified hypothyroidism: Secondary | ICD-10-CM | POA: Diagnosis not present

## 2016-03-15 DIAGNOSIS — E7849 Other hyperlipidemia: Secondary | ICD-10-CM

## 2016-03-15 NOTE — Progress Notes (Addendum)
   Subjective:    Patient ID: Thomas [name], male    DOB: 1934/06/07, 81 y.o.   MRN: [id number]  Hyperlipidemia  Pertinent negatives include no chest pain or shortness of breath.  Hyperlipidema. Pt states no concerns. Takes meds every day. Stays active. Works 12 hours a day. Eats healthy.  Patient taken his thyroid medicine a regular basis states energy level good Takes his cholesterol medicine on a regular basis denies any problems with it Takes his blood pressure medicine on a regular basis denies any dizziness with it States his lungs are actually doing well using Symbicort on a regular basis albuterol on as needed He does take his acid blocker on a regular basis it does a good job controlling things    Review of Systems  Constitutional: Negative for activity change, fatigue and fever.  Respiratory: Negative for cough and shortness of breath.   Cardiovascular: Negative for chest pain and leg swelling.  Neurological: Negative for headaches.       Objective:   Physical Exam  Constitutional: He appears well-nourished. No distress.  Cardiovascular: Normal rate, regular rhythm and normal heart sounds.   No murmur heard. Pulmonary/Chest: Effort normal and breath sounds normal. No respiratory distress.  Musculoskeletal: He exhibits no edema.  Lymphadenopathy:    He has no cervical adenopathy.  Neurological: He is alert.  Psychiatric: His behavior is normal.  Vitals reviewed.  Patient states he abdominal area that he was having trouble with is now no longer tender he does have a colonoscopy coming up to make sure that this is not a tumor Thyroid is where it needs to be lab result reviewed lipid where it needs to be lab result reviewed patient with comprehensive metabolic 7 toward the end of October.   The patient has an area that is concerning for the possibility of inguinal hernia may need referral to general surgery to have this addressed     Assessment & Plan:  HTN good  control continue current measures watch diet closely Hyperlipidemia previous labs reviewed continue current measures and medicine watch diet Reflux under good control medicine continue this Hypothyroidism good control lab reviewed with patient continue medication Lower abdominal discomfort has CAT scan coming up hopefully this will have good news for him. Patient should follow-up in approximate 6 months sooner if any problems

## 2016-03-15 NOTE — Progress Notes (Signed)
   Subjective:    Patient ID: Thomas [name], male    DOB: 06/24/1934, 81 y.o.   MRN: [id number]  HPI    Review of Systems     Objective:   Physical Exam        Assessment & Plan:

## 2016-03-22 ENCOUNTER — Other Ambulatory Visit: Payer: Self-pay | Admitting: Family Medicine

## 2016-03-27 DIAGNOSIS — J309 Allergic rhinitis, unspecified: Secondary | ICD-10-CM | POA: Diagnosis not present

## 2016-04-03 DIAGNOSIS — J309 Allergic rhinitis, unspecified: Secondary | ICD-10-CM | POA: Diagnosis not present

## 2016-04-06 ENCOUNTER — Encounter (HOSPITAL_COMMUNITY)
Admission: RE | Disposition: A | Discharge status: Home / Self Care | Payer: Self-pay | Source: Ambulatory Visit | Attending: Internal Medicine

## 2016-04-06 ENCOUNTER — Encounter (HOSPITAL_COMMUNITY): Payer: Self-pay

## 2016-04-06 ENCOUNTER — Ambulatory Visit (HOSPITAL_COMMUNITY)
Admission: RE | Admit: 2016-04-06 | Discharge: 2016-04-06 | Disposition: A | Discharge status: Home / Self Care | Payer: Medicare Other | Source: Ambulatory Visit | Attending: Internal Medicine | Admitting: Internal Medicine

## 2016-04-06 DIAGNOSIS — J45909 Unspecified asthma, uncomplicated: Secondary | ICD-10-CM | POA: Insufficient documentation

## 2016-04-06 DIAGNOSIS — Z880 Allergy status to penicillin: Secondary | ICD-10-CM | POA: Diagnosis not present

## 2016-04-06 DIAGNOSIS — R933 Abnormal findings on diagnostic imaging of other parts of digestive tract: Secondary | ICD-10-CM | POA: Diagnosis not present

## 2016-04-06 DIAGNOSIS — R935 Abnormal findings on diagnostic imaging of other abdominal regions, including retroperitoneum: Secondary | ICD-10-CM | POA: Insufficient documentation

## 2016-04-06 DIAGNOSIS — M1990 Unspecified osteoarthritis, unspecified site: Secondary | ICD-10-CM | POA: Insufficient documentation

## 2016-04-06 DIAGNOSIS — I10 Essential (primary) hypertension: Secondary | ICD-10-CM | POA: Diagnosis not present

## 2016-04-06 DIAGNOSIS — K219 Gastro-esophageal reflux disease without esophagitis: Secondary | ICD-10-CM | POA: Diagnosis not present

## 2016-04-06 DIAGNOSIS — K5730 Diverticulosis of large intestine without perforation or abscess without bleeding: Secondary | ICD-10-CM | POA: Insufficient documentation

## 2016-04-06 DIAGNOSIS — E039 Hypothyroidism, unspecified: Secondary | ICD-10-CM | POA: Insufficient documentation

## 2016-04-06 DIAGNOSIS — G4730 Sleep apnea, unspecified: Secondary | ICD-10-CM | POA: Diagnosis not present

## 2016-04-06 DIAGNOSIS — K644 Residual hemorrhoidal skin tags: Secondary | ICD-10-CM | POA: Diagnosis not present

## 2016-04-06 DIAGNOSIS — Z79899 Other long term (current) drug therapy: Secondary | ICD-10-CM | POA: Diagnosis not present

## 2016-04-06 HISTORY — PX: COLONOSCOPY: SHX5424

## 2016-04-06 SURGERY — COLONOSCOPY
Anesthesia: Moderate Sedation

## 2016-04-06 MED ORDER — MIDAZOLAM HCL 5 MG/5ML IJ SOLN
INTRAMUSCULAR | Status: AC
  Filled 2016-04-06: qty 10

## 2016-04-06 MED ORDER — MEPERIDINE HCL 50 MG/ML IJ SOLN
INTRAMUSCULAR | Status: DC | PRN
  Administered 2016-04-06 (×2): 25 mg via INTRAVENOUS

## 2016-04-06 MED ORDER — SIMETHICONE 40 MG/0.6ML PO SUSP
ORAL | Status: AC
  Filled 2016-04-06: qty 30

## 2016-04-06 MED ORDER — MIDAZOLAM HCL 5 MG/5ML IJ SOLN
INTRAMUSCULAR | Status: DC | PRN
  Administered 2016-04-06: 1 mg via INTRAVENOUS
  Administered 2016-04-06: 2 mg via INTRAVENOUS
  Administered 2016-04-06 (×2): 1 mg via INTRAVENOUS

## 2016-04-06 MED ORDER — STERILE WATER FOR IRRIGATION IR SOLN
Status: DC | PRN
  Administered 2016-04-06: 08:00:00

## 2016-04-06 MED ORDER — SODIUM CHLORIDE 0.9 % IV SOLN
INTRAVENOUS | Status: DC
  Administered 2016-04-06: 07:00:00 via INTRAVENOUS

## 2016-04-06 MED ORDER — MEPERIDINE HCL 50 MG/ML IJ SOLN
INTRAMUSCULAR | Status: AC
  Filled 2016-04-06: qty 1

## 2016-04-06 NOTE — Op Note (Signed)
[facility] Patient Name: Thomas [name] Procedure Date: 04/06/2016 7:09 AM MRN: [id number] Date of Birth: May 27, 1934 Attending MD: Hildred Laser , MD CSN: [id number] Age: 81 Admit Type: Outpatient Procedure:                Colonoscopy Indications:              Abnormal CT of the GI tract Providers:                Hildred Laser, MD, Otis Peak B. Sharon Seller, RN, Randa Spike, Technician, Aram Candela Referring MD:             Elayne Snare. Wolfgang Phoenix, MD Medicines:                Meperidine 50 mg IV, Midazolam 5 mg IV Complications:            No immediate complications. Estimated Blood Loss:     Estimated blood loss: none. Procedure:                Pre-Anesthesia Assessment:                           - Prior to the procedure, a History and Physical                            was performed, and patient medications and                            allergies were reviewed. The patient's tolerance of                            previous anesthesia was also reviewed. The risks                            and benefits of the procedure and the sedation                            options and risks were discussed with the patient.                            All questions were answered, and informed consent                            was obtained. Prior Anticoagulants: The patient                            last took Lovenox (enoxaparin) 7 days prior to the                            procedure. ASA Grade Assessment: II - A patient                            with mild systemic disease. After reviewing the  risks and benefits, the patient was deemed in                            satisfactory condition to undergo the procedure.                           After obtaining informed consent, the colonoscope                            was passed under direct vision. Throughout the                            procedure, the patient's blood pressure, pulse, and                            oxygen saturations were monitored continuously. The                            [id number]) was introduced through the                            anus and advanced to the the cecum, identified by                            appendiceal orifice and ileocecal valve. The                            colonoscopy was performed without difficulty. The                            patient tolerated the procedure well. The quality                            of the bowel preparation was good. The ileocecal                            valve, appendiceal orifice, and rectum were                            photographed. Scope In: 8:07:48 AM Scope Out: 8:25:20 AM Scope Withdrawal Time: 0 hours 10 minutes 35 seconds  Total Procedure Duration: 0 hours 17 minutes 32 seconds  Findings:      The perianal and digital rectal examinations were normal.      Multiple small and large-mouthed diverticula were found in the sigmoid       colon.      The exam was otherwise normal throughout the examined colon.      External hemorrhoids were found during retroflexion. The hemorrhoids       were medium-sized. Impression:               - Diverticulosis in the sigmoid colon.                           - External hemorrhoids.                           -  No specimens collected. Moderate Sedation:      Moderate (conscious) sedation was administered by the endoscopy nurse       and supervised by the endoscopist. The following parameters were       monitored: oxygen saturation, heart rate, blood pressure, CO2       capnography and response to care. Total physician intraservice time was       23 minutes. Recommendation:           - Patient has a contact number available for                            emergencies. The signs and symptoms of potential                            delayed complications were discussed with the                            patient. Return to normal activities tomorrow.                             Written discharge instructions were provided to the                            patient.                           - High fiber diet today.                           - Continue present medications.                           - No repeat colonoscopy due to the absence of                            advanced adenomas. Procedure Code(s):        --- Professional ---                           [phone], Colonoscopy, flexible; diagnostic, including                            collection of specimen(s) by brushing or washing,                            when performed (separate procedure)                           99152, Moderate sedation services provided by the                            same physician or other qualified health care                            professional performing the diagnostic or  therapeutic service that the sedation supports,                            requiring the presence of an independent trained                            observer to assist in the monitoring of the                            patient's level of consciousness and physiological                            status; initial 15 minutes of intraservice time,                            patient age 64 years or older                           [phone], Moderate sedation services; each additional                            15 minutes intraservice time Diagnosis Code(s):        --- Professional ---                           K64.4, Residual hemorrhoidal skin tags                           K57.30, Diverticulosis of large intestine without                            perforation or abscess without bleeding                           R93.3, Abnormal findings on diagnostic imaging of                            other parts of digestive tract CPT copyright 2016 American Medical Association. All rights reserved. The codes documented in this report are preliminary and upon coder review  may  be revised to meet current compliance requirements. Hildred Laser, MD Hildred Laser, MD 04/06/2016 8:34:45 AM This report has been signed electronically. Number of Addenda: 0

## 2016-04-06 NOTE — Discharge Instructions (Signed)
Resume usual medications. Keep ibuprofen to minimum. High fiber diet. No driving for 24 hours. Call if you have another episode of lower abdominal pain.   Colonoscopy, Adult, Care After This sheet gives you information about how to care for yourself after your procedure. Your health care provider may also give you more specific instructions. If you have problems or questions, contact your health care provider. What can I expect after the procedure? After the procedure, it is common to have:  A small amount of blood in your stool for 24 hours after the procedure.  Some gas.  Mild abdominal cramping or bloating. Follow these instructions at home: General instructions  For the first 24 hours after the procedure:  Do not drive or use machinery.  Do not sign important documents.  Do not drink alcohol.  Do your regular daily activities at a slower pace than normal.  Eat soft, easy-to-digest foods.  Rest often.  Take over-the-counter or prescription medicines only as told by your health care provider.  It is up to you to get the results of your procedure. Ask your health care provider, or the department performing the procedure, when your results will be ready. Relieving cramping and bloating  Try walking around when you have cramps or feel bloated.  Apply heat to your abdomen as told by your health care provider. Use a heat source that your health care provider recommends, such as a moist heat pack or a heating pad.  Place a towel between your skin and the heat source.  Leave the heat on for 20-30 minutes.  Remove the heat if your skin turns bright red. This is especially important if you are unable to feel pain, heat, or cold. You may have a greater risk of getting burned. Eating and drinking  Drink enough fluid to keep your urine clear or pale yellow.  Resume your normal diet as instructed by your health care provider. Avoid heavy or fried foods that are hard to  digest.  Avoid drinking alcohol for as long as instructed by your health care provider. Contact a health care provider if:  You have blood in your stool 2-3 days after the procedure. Get help right away if:  You have more than a small spotting of blood in your stool.  You pass large blood clots in your stool.  Your abdomen is swollen.  You have nausea or vomiting.  You have a fever.  You have increasing abdominal pain that is not relieved with medicine. This information is not intended to replace advice given to you by your health care provider. Make sure you discuss any questions you have with your health care provider. Document Released: 11/15/2003 Document Revised: 12/26/2015 Document Reviewed: 06/14/2015 Elsevier Interactive Patient Education  2017 [employer].   High-Fiber Diet Fiber, also called dietary fiber, is a type of carbohydrate found in fruits, vegetables, whole grains, and beans. A high-fiber diet can have many health benefits. Your health care provider may recommend a high-fiber diet to help:  Prevent constipation. Fiber can make your bowel movements more regular.  Lower your cholesterol.  Relieve hemorrhoids, uncomplicated diverticulosis, or irritable bowel syndrome.  Prevent overeating as part of a weight-loss plan.  Prevent heart disease, type 2 diabetes, and certain cancers. What is my plan? The recommended daily intake of fiber includes:  38 grams for men under age 48.  41 grams for men over age 1.  15 grams for women under age 76.  21 grams for women over  age 62. You can get the recommended daily intake of dietary fiber by eating a variety of fruits, vegetables, grains, and beans. Your health care provider may also recommend a fiber supplement if it is not possible to get enough fiber through your diet. What do I need to know about a high-fiber diet?  Fiber supplements have not been widely studied for their effectiveness, so it is better to  get fiber through food sources.  Always check the fiber content on thenutrition facts label of any prepackaged food. Look for foods that contain at least 5 grams of fiber per serving.  Ask your dietitian if you have questions about specific foods that are related to your condition, especially if those foods are not listed in the following section.  Increase your daily fiber consumption gradually. Increasing your intake of dietary fiber too quickly may cause bloating, cramping, or gas.  Drink plenty of water. Water helps you to digest fiber. What foods can I eat? Grains  Whole-grain breads. Multigrain cereal. Oats and oatmeal. Brown rice. Barley. Bulgur wheat. [place]. Bran muffins. Popcorn. Rye wafer crackers. Vegetables  Sweet potatoes. Spinach. Kale. Artichokes. Cabbage. Broccoli. Green peas. Carrots. Squash. Fruits  Berries. Pears. Apples. Oranges. Avocados. Prunes and raisins. Dried figs. Meats and Other Protein Sources  [place], kidney, pinto, and soy beans. Split peas. Lentils. Nuts and seeds. Dairy  Fiber-fortified yogurt. Beverages  Fiber-fortified soy milk. Fiber-fortified orange juice. Other  Fiber bars. The items listed above may not be a complete list of recommended foods or beverages. Contact your dietitian for more options.  What foods are not recommended? Grains  White bread. Pasta made with refined flour. White rice. Vegetables  Fried potatoes. Canned vegetables. Well-cooked vegetables. Fruits  Fruit juice. Cooked, strained fruit. Meats and Other Protein Sources  Fatty cuts of meat. Fried Sales executive or fried fish. Dairy  Milk. Yogurt. Cream cheese. Sour cream. Beverages  Soft drinks. Other  Cakes and pastries. Butter and oils. The items listed above may not be a complete list of foods and beverages to avoid. Contact your dietitian for more information.  What are some tips for including high-fiber foods in my diet?  Eat a wide variety of high-fiber foods.  Make  sure that half of all grains consumed each day are whole grains.  Replace breads and cereals made from refined flour or white flour with whole-grain breads and cereals.  Replace white rice with brown rice, bulgur wheat, or millet.  Start the day with a breakfast that is high in fiber, such as a cereal that contains at least 5 grams of fiber per serving.  Use beans in place of meat in soups, salads, or pasta.  Eat high-fiber snacks, such as berries, raw vegetables, nuts, or popcorn. This information is not intended to replace advice given to you by your health care provider. Make sure you discuss any questions you have with your health care provider. Document Released: 04/02/2005 Document Revised: 09/08/2015 Document Reviewed: 09/15/2013 Elsevier Interactive Patient Education  2017 [employer]   Diverticulitis Diverticulitis is when small pockets that have formed in your colon (large intestine) become infected or swollen. Follow these instructions at home:  Follow your doctor's instructions.  Follow a special diet if told by your doctor.  When you feel better, your doctor may tell you to change your diet. You may be told to eat a lot of fiber. Fruits and vegetables are good sources of fiber. Fiber makes it easier to poop (have bowel movements).  Take  supplements or probiotics as told by your doctor.  Only take medicines as told by your doctor.  Keep all follow-up visits with your doctor. Contact a doctor if:  Your pain does not get better.  You have a hard time eating food.  You are not pooping like normal. Get help right away if:  Your pain gets worse.  Your problems do not get better.  Your problems suddenly get worse.  You have a fever.  You keep throwing up (vomiting).  You have bloody or black, tarry poop (stool). This information is not intended to replace advice given to you by your health care provider. Make sure you discuss any questions you have with  your health care provider. Document Released: 09/19/2007 Document Revised: 09/08/2015 Document Reviewed: 02/25/2013 Elsevier Interactive Patient Education  2017 [employer].

## 2016-04-06 NOTE — H&P (Signed)
Thomas [name] is an 81 y.o. male.   Chief Complaint: Patient is here for colonoscopy. HPI: Patient is 81 year old Caucasian male whose last colonoscopy was in 2007 who presented with acute onset of LLQ abdominal pain and was diagnosed with diverticulitis about 7 weeks ago. He was treated with antibiotics and his symptoms have resolved. He had abdominopelvic CT on 02/13/2016. which revealed focal thickening in the sigmoid colon. He is therefore undergoing diagnostic colonoscopy. He denies diarrhea constipation or rectal bleeding. Family History is negative for CRC.  Past Medical History:  Diagnosis Date  . Arthritis    "my entire back is gone"  . Asthma   . GERD (gastroesophageal reflux disease)   . H/O hiatal hernia   . Hernia   . History of stress test 2011  . Hypertension   . Hypothyroidism   . Kidney stones   . Pollen allergies   . Polycythemia   . Renal calculus    some removed by Cystoscopy, one passed spontaneously  . Sleep apnea    mild no cpap    Past Surgical History:  Procedure Laterality Date  . APPENDECTOMY     50 years ago  . BACK SURGERY     L-4 , L-5  patient states that it was years ago.  Marland Kitchen COLONOSCOPY     at age 35  . EYE SURGERY     cataracts removed- bilateral- /w IOL  . THYROIDECTOMY Right 09/17/2012   Procedure: RIGHT HEMI-THYROIDECTOMY;  Surgeon: Ascencion Dike, MD;  Location: [facility];  Service: ENT;  Laterality: Right;  . UPPER GASTROINTESTINAL ENDOSCOPY     egd/ed  . YAG LASER APPLICATION Left [id number]   Procedure: YAG LASER APPLICATION;  Surgeon: Rutherford Guys, MD;  Location: [facility];  Service: Ophthalmology;  Laterality: Left;    Family History  Problem Relation Age of Onset  . Heart disease Mother   . Heart disease Father   . Heart disease Brother   . Healthy Son    Social History:  reports that he has never smoked. His smokeless tobacco use includes Chew. He reports that he does not drink alcohol or use drugs.  Allergies:  Allergies   Allergen Reactions  . Penicillins Other (See Comments)    Reaction many years ago-unknown reaction Has patient had a PCN reaction causing immediate rash, facial/tongue/throat swelling, SOB or lightheadedness with hypotension: Unknown Has patient had a PCN reaction causing severe rash involving mucus membranes or skin necrosis: Unknown Has patient had a PCN reaction that required hospitalization Unknown Has patient had a PCN reaction occurring within the last 10 years: No If all of the above answers are "NO", then may proceed with Cephalosporin use.     Medications Prior to Admission  Medication Sig Dispense Refill  . albuterol (PROVENTIL HFA;VENTOLIN HFA) 108 (90 BASE) MCG/ACT inhaler Inhale 2 puffs into the lungs every 4 (four) hours as needed. (Patient taking differently: Inhale 2 puffs into the lungs every 4 (four) hours as needed for wheezing or shortness of breath. ) 1 Inhaler 0  . budesonide-formoterol (SYMBICORT) 80-4.5 MCG/ACT inhaler Inhale 1 puff into the lungs 2 (two) times daily.    . fluticasone (FLONASE) 50 MCG/ACT nasal spray INHALE 1 SPRAY INTO EACH NOSTRIL TWICE DAILY. (Patient taking differently: INHALE 1 SPRAY INTO EACH NOSTRIL TWICE DAILY AS NEEDED FOR ALLERGIES) 16 g 0  . gabapentin (NEURONTIN) 400 MG capsule Take 1 capsule (400 mg total) by mouth 3 (three) times daily. 90 capsule 12  .  Hypromellose (ARTIFICIAL TEARS OP) Place 1 drop into both eyes daily as needed (dry eyes).     Marland Kitchen ibuprofen (ADVIL,MOTRIN) 200 MG tablet Take 400 mg by mouth 2 (two) times daily as needed for headache or moderate pain.    Marland Kitchen levothyroxine (SYNTHROID, LEVOTHROID) 100 MCG tablet TAKE ONE TABLET BY MOUTH DAILY BEFORE BREAKFAST. 90 tablet 2  . omeprazole (PRILOSEC) 20 MG capsule Take 1 capsule (20 mg total) by mouth daily. 90 capsule 1  . pravastatin (PRAVACHOL) 40 MG tablet TAKE ONE TABLET AT BEDTIME AS DIRECTED. 90 tablet 1  . triamcinolone cream (KENALOG) 0.1 % Apply 1 application topically 2  (two) times daily. (Patient taking differently: Apply 1 application topically 2 (two) times daily as needed (rash). ) 45 g 2  . valsartan-hydrochlorothiazide (DIOVAN-HCT) 80-12.5 MG tablet TAKE ONE TABLET BY MOUTH ONCE DAILY. 90 tablet 0    No results found for this or any previous visit (from the past 48 hour(s)). No results found.  ROS  Blood pressure (!) 143/69, pulse 70, temperature 98.9 F (37.2 C), temperature source Oral, resp. rate 13, height 5\' 8"  (1.727 m), weight 180 lb (81.6 kg), SpO2 96 %. Physical Exam  Constitutional: He appears well-developed and well-nourished.  HENT:  Mouth/Throat: Oropharynx is clear and moist.  Eyes: Conjunctivae are normal. No scleral icterus.  Neck: No thyromegaly present.  Cardiovascular: Normal rate, regular rhythm and normal heart sounds.   No murmur heard. Respiratory: Effort normal and breath sounds normal.  GI:  Abdomen is symmetrical and soft without tenderness organomegaly or masses. He has left inguinal hernia which is completely reducible.  Musculoskeletal: He exhibits no edema.  Lymphadenopathy:    He has no cervical adenopathy.  Neurological: He is alert.  Skin: Skin is warm and dry.     Assessment/Plan History of diverticulitis. Abnormal CT reeling area of focal thickening/mass like appearance. Diagnostic colonoscopy.  Hildred Laser, MD 04/06/2016, 7:56 AM

## 2016-04-11 ENCOUNTER — Encounter (HOSPITAL_COMMUNITY): Payer: Self-pay | Admitting: Internal Medicine

## 2016-04-12 DIAGNOSIS — J309 Allergic rhinitis, unspecified: Secondary | ICD-10-CM | POA: Diagnosis not present

## 2016-04-19 DIAGNOSIS — J309 Allergic rhinitis, unspecified: Secondary | ICD-10-CM | POA: Diagnosis not present

## 2016-04-20 ENCOUNTER — Telehealth (INDEPENDENT_AMBULATORY_CARE_PROVIDER_SITE_OTHER): Payer: Self-pay | Admitting: Internal Medicine

## 2016-04-20 NOTE — Telephone Encounter (Signed)
Dr. Laural Golden, this patient is on the recall list to come in around 06/01/16.  He just had a procedure 04/06/16.  Does he still need to come in around 06/01/16?  If not, does he need a return appointment?

## 2016-04-23 ENCOUNTER — Other Ambulatory Visit (HOSPITAL_COMMUNITY): Payer: Self-pay | Admitting: *Deleted

## 2016-04-23 DIAGNOSIS — D45 Polycythemia vera: Secondary | ICD-10-CM

## 2016-04-24 ENCOUNTER — Encounter (HOSPITAL_COMMUNITY): Payer: Medicare Other | Attending: Hematology & Oncology | Admitting: Hematology & Oncology

## 2016-04-24 ENCOUNTER — Encounter (HOSPITAL_COMMUNITY): Payer: Medicare Other

## 2016-04-24 ENCOUNTER — Encounter (HOSPITAL_COMMUNITY): Payer: Self-pay | Admitting: Hematology & Oncology

## 2016-04-24 ENCOUNTER — Telehealth: Payer: Self-pay | Admitting: Family Medicine

## 2016-04-24 VITALS — BP 130/69 | HR 62 | Temp 98.1°F | Resp 16 | Ht 69.0 in | Wt 179.0 lb

## 2016-04-24 DIAGNOSIS — K4090 Unilateral inguinal hernia, without obstruction or gangrene, not specified as recurrent: Secondary | ICD-10-CM

## 2016-04-24 DIAGNOSIS — D751 Secondary polycythemia: Secondary | ICD-10-CM | POA: Insufficient documentation

## 2016-04-24 DIAGNOSIS — J4520 Mild intermittent asthma, uncomplicated: Secondary | ICD-10-CM

## 2016-04-24 DIAGNOSIS — J45909 Unspecified asthma, uncomplicated: Secondary | ICD-10-CM | POA: Insufficient documentation

## 2016-04-24 DIAGNOSIS — R51 Headache: Secondary | ICD-10-CM | POA: Diagnosis not present

## 2016-04-24 DIAGNOSIS — Z8249 Family history of ischemic heart disease and other diseases of the circulatory system: Secondary | ICD-10-CM | POA: Insufficient documentation

## 2016-04-24 DIAGNOSIS — F17220 Nicotine dependence, chewing tobacco, uncomplicated: Secondary | ICD-10-CM | POA: Insufficient documentation

## 2016-04-24 DIAGNOSIS — Z79899 Other long term (current) drug therapy: Secondary | ICD-10-CM | POA: Insufficient documentation

## 2016-04-24 DIAGNOSIS — Z87442 Personal history of urinary calculi: Secondary | ICD-10-CM | POA: Insufficient documentation

## 2016-04-24 DIAGNOSIS — K219 Gastro-esophageal reflux disease without esophagitis: Secondary | ICD-10-CM | POA: Insufficient documentation

## 2016-04-24 DIAGNOSIS — D45 Polycythemia vera: Secondary | ICD-10-CM

## 2016-04-24 DIAGNOSIS — Z880 Allergy status to penicillin: Secondary | ICD-10-CM | POA: Insufficient documentation

## 2016-04-24 DIAGNOSIS — E039 Hypothyroidism, unspecified: Secondary | ICD-10-CM | POA: Diagnosis not present

## 2016-04-24 DIAGNOSIS — Z7951 Long term (current) use of inhaled steroids: Secondary | ICD-10-CM | POA: Diagnosis not present

## 2016-04-24 DIAGNOSIS — D696 Thrombocytopenia, unspecified: Secondary | ICD-10-CM | POA: Insufficient documentation

## 2016-04-24 DIAGNOSIS — Z720 Tobacco use: Secondary | ICD-10-CM

## 2016-04-24 DIAGNOSIS — I10 Essential (primary) hypertension: Secondary | ICD-10-CM | POA: Insufficient documentation

## 2016-04-24 LAB — COMPREHENSIVE METABOLIC PANEL
ALBUMIN: 4.2 g/dL (ref 3.5–5.0)
ALT: 50 U/L (ref 17–63)
AST: 38 U/L (ref 15–41)
Alkaline Phosphatase: 65 U/L (ref 38–126)
Anion gap: 6 (ref 5–15)
BILIRUBIN TOTAL: 1.2 mg/dL (ref 0.3–1.2)
BUN: 14 mg/dL (ref 6–20)
CO2: 29 mmol/L (ref 22–32)
Calcium: 9.2 mg/dL (ref 8.9–10.3)
Chloride: 101 mmol/L (ref 101–111)
Creatinine, Ser: 0.98 mg/dL (ref 0.61–1.24)
GFR calc Af Amer: >60 mL/min (ref >60)
GFR calc non Af Amer: >60 mL/min (ref >60)
GLUCOSE: 94 mg/dL (ref 65–99)
POTASSIUM: 3.8 mmol/L (ref 3.5–5.1)
Sodium: 136 mmol/L (ref 135–145)
TOTAL PROTEIN: 7.2 g/dL (ref 6.5–8.1)

## 2016-04-24 LAB — CBC WITH DIFFERENTIAL/PLATELET
BASOS ABS: 0 10*3/uL (ref 0.0–0.1)
BASOS PCT: 0 %
Eosinophils Absolute: 0.2 10*3/uL (ref 0.0–0.7)
Eosinophils Relative: 2 %
HCT: 50.6 % (ref 39.0–52.0)
HEMOGLOBIN: 18.3 g/dL — AB (ref 13.0–17.0)
Lymphocytes Relative: 23 %
Lymphs Abs: 1.6 10*3/uL (ref 0.7–4.0)
MCH: 33.2 pg (ref 26.0–34.0)
MCHC: 36.2 g/dL — ABNORMAL HIGH (ref 30.0–36.0)
MCV: 91.8 fL (ref 78.0–100.0)
MONO ABS: 0.4 10*3/uL (ref 0.1–1.0)
Monocytes Relative: 6 %
NEUTROS ABS: 4.8 10*3/uL (ref 1.7–7.7)
NEUTROS PCT: 69 %
Platelets: 174 10*3/uL (ref 150–400)
RBC: 5.51 MIL/uL (ref 4.22–5.81)
RDW: 13.1 % (ref 11.5–15.5)
WBC: 7 10*3/uL (ref 4.0–10.5)

## 2016-04-24 LAB — FERRITIN: Ferritin: 93 ng/mL (ref 24–336)

## 2016-04-24 NOTE — Telephone Encounter (Signed)
He is up-to-date and would not need another colonoscopy.

## 2016-04-24 NOTE — Telephone Encounter (Signed)
Pt is requesting a referral for hernia surgery to the Dr. Parks Ranger Dr. Nicki Reaper recommended.

## 2016-04-24 NOTE — Telephone Encounter (Signed)
He does not need appointment next month. Office visit on as-needed basis.

## 2016-04-24 NOTE — Progress Notes (Signed)
[place] at [facility] Progress Note  Patient Care Team: [name], MD as PCP - General (Family Medicine)  CHIEF COMPLAINTS/PURPOSE OF CONSULTATION:  Polycythemia, JAK-2 negative CALR, JAK 2 exon 12, MPL negative Asthma  HISTORY OF PRESENTING ILLNESS:  Thomas [name] 81 y.o. male is here because of JAK-2 negative polycythemia. He has never smoked but used chewing tobacco most of his life. No history of BMBX. History of sleep apnea and asthma.   He is present alone today.  Since his last visit, he has glaucoma surgery with Dr. Gershon Crane on 04/08/16. He has also had a colonoscopy with Dr. Laural Golden on 04/06/16.  He had a fantastic holiday.   States he is breathing well.  Minor headaches.   States his colon got inflamed and got a colonoscopy for it. It is has since been resolved. Colonoscopy was on 03/17/2016.  He states he has a hernia.   States he is sleeping well.   Says he lost weight through the summer because he gets too busy and forgets to eat.   He notes he felt better previously after phlebotomy, and is requesting to be phlebotomized again.  MEDICAL HISTORY:  Past Medical History:  Diagnosis Date  . Arthritis    "my entire back is gone"  . Asthma   . GERD (gastroesophageal reflux disease)   . H/O hiatal hernia   . Hernia   . History of stress test 2011  . Hypertension   . Hypothyroidism   . Kidney stones   . Pollen allergies   . Polycythemia   . Renal calculus    some removed by Cystoscopy, one passed spontaneously  . Sleep apnea    mild no cpap    SURGICAL HISTORY: Past Surgical History:  Procedure Laterality Date  . APPENDECTOMY     50 years ago  . BACK SURGERY     L-4 , L-5  patient states that it was years ago.  Marland Kitchen COLONOSCOPY     at age 38  . COLONOSCOPY N/A 04/06/2016   Procedure: COLONOSCOPY;  Surgeon: Rogene Houston, MD;  [facility];  Service: Endoscopy;  Laterality: N/A;  7:30  . EYE SURGERY     cataracts removed- bilateral- /w IOL  . THYROIDECTOMY Right 09/17/2012   Procedure: RIGHT HEMI-THYROIDECTOMY;  Surgeon: Ascencion Dike, MD;  Location: [facility];  Service: ENT;  Laterality: Right;  . UPPER GASTROINTESTINAL ENDOSCOPY     egd/ed  . YAG LASER APPLICATION Left [id number]/5573   Procedure: YAG LASER APPLICATION;  Surgeon: Rutherford Guys, MD;  Location: [facility];  Service: Ophthalmology;  Laterality: Left;    SOCIAL HISTORY: Social History   Social History  . Marital status: Widowed    Spouse name: N/A  . Number of children: N/A  . Years of education: N/A   Occupational History  . Not on file.   Social History Main Topics  . Smoking status: Never Smoker  . Smokeless tobacco: Current User    Types: Chew     Comment: Patient states that he has been chewing for 60 plus years  . Alcohol use No     Comment: Patient states that he drinks a bout a case of beer a year  . Drug use: No  . Sexual activity: No   Other Topics Concern  . Not on file   Social History Narrative  . No narrative on file    FAMILY HISTORY: Family History  Problem Relation Age  of Onset  . Heart disease Mother   . Heart disease Father   . Heart disease Brother   . Healthy Son    indicated that his mother is deceased. He indicated that his father is deceased. He indicated that his sister is deceased. He indicated that his brother is deceased. He indicated that his son is alive. He indicated that his child is alive. He indicated that his other is alive.    ALLERGIES:  is allergic to penicillins.  MEDICATIONS:  Current Outpatient Prescriptions  Medication Sig Dispense Refill  . albuterol (PROVENTIL HFA;VENTOLIN HFA) 108 (90 BASE) MCG/ACT inhaler Inhale 2 puffs into the lungs every 4 (four) hours as needed. (Patient taking differently: Inhale 2 puffs into the lungs every 4 (four) hours as needed for wheezing or shortness of breath. ) 1 Inhaler 0  . budesonide-formoterol (SYMBICORT) 80-4.5 MCG/ACT inhaler Inhale  1 puff into the lungs 2 (two) times daily.    . fluticasone (FLONASE) 50 MCG/ACT nasal spray INHALE 1 SPRAY INTO EACH NOSTRIL TWICE DAILY. (Patient taking differently: INHALE 1 SPRAY INTO EACH NOSTRIL TWICE DAILY AS NEEDED FOR ALLERGIES) 16 g 0  . gabapentin (NEURONTIN) 400 MG capsule Take 1 capsule (400 mg total) by mouth 3 (three) times daily. 90 capsule 12  . Hypromellose (ARTIFICIAL TEARS OP) Place 1 drop into both eyes daily as needed (dry eyes).     Marland Kitchen ibuprofen (ADVIL,MOTRIN) 200 MG tablet Take 400 mg by mouth 2 (two) times daily as needed for headache or moderate pain.    Marland Kitchen levothyroxine (SYNTHROID, LEVOTHROID) 100 MCG tablet TAKE ONE TABLET BY MOUTH DAILY BEFORE BREAKFAST. 90 tablet 2  . omeprazole (PRILOSEC) 20 MG capsule Take 1 capsule (20 mg total) by mouth daily. 90 capsule 1  . pravastatin (PRAVACHOL) 40 MG tablet TAKE ONE TABLET AT BEDTIME AS DIRECTED. 90 tablet 1  . triamcinolone cream (KENALOG) 0.1 % Apply 1 application topically 2 (two) times daily. (Patient taking differently: Apply 1 application topically 2 (two) times daily as needed (rash). ) 45 g 2  . valsartan-hydrochlorothiazide (DIOVAN-HCT) 80-12.5 MG tablet TAKE ONE TABLET BY MOUTH ONCE DAILY. 90 tablet 0   No current facility-administered medications for this visit.     Review of Systems  Constitutional: Negative for chills, fever, malaise/fatigue and weight loss.  HENT: Negative for congestion, hearing loss, nosebleeds, sore throat and tinnitus.   Eyes: Negative for blurred vision, double vision, pain and discharge.  Respiratory: Negative for cough, hemoptysis, sputum production, shortness of breath and wheezing.   Cardiovascular: Negative for chest pain, palpitations, claudication, leg swelling and PND.  Gastrointestinal: Negative for abdominal pain, blood in stool, constipation, diarrhea, heartburn, melena, nausea and vomiting.       Hernia.  Genitourinary: Negative for dysuria, frequency, hematuria and urgency.    Musculoskeletal: Negative for falls, joint pain and myalgias.  Skin: Negative for itching and rash.  Neurological: Positive for headaches (minor). Negative for dizziness, tingling, tremors, sensory change, speech change, focal weakness, seizures, loss of consciousness and weakness.  Endo/Heme/Allergies: Does not bruise/bleed easily.  Psychiatric/Behavioral: Negative for depression, memory loss, substance abuse and suicidal ideas. The patient is not nervous/anxious.    14 point ROS was done and is otherwise as detailed above or in HPI   PHYSICAL EXAMINATION: ECOG PERFORMANCE STATUS: 0 - Asymptomatic  Vitals:   04/24/16 1330  BP: 130/69  Pulse: 62  Resp: 16  Temp: 98.1 F (36.7 C)   Filed Weights   04/24/16 1330  Weight: 179  lb (81.2 kg)    Physical Exam  Constitutional: He is oriented to person, place, and time and well-developed, well-nourished, and in no distress.  Appears younger than stated age  HENT:  Head: Normocephalic and atraumatic.  Nose: Nose normal.  Mouth/Throat: Oropharynx is clear and moist. No oropharyngeal exudate.  Eyes: Conjunctivae and EOM are normal. Pupils are equal, round, and reactive to light. Right eye exhibits no discharge. Left eye exhibits no discharge. No scleral icterus.  Neck: Normal range of motion. Neck supple. No tracheal deviation present. No thyromegaly present.  Cardiovascular: Normal rate, regular rhythm and normal heart sounds.  Exam reveals no gallop and no friction rub.   No murmur heard. Pulmonary/Chest: Effort normal and breath sounds normal. He has no wheezes. He has no rales.  Abdominal: Soft. Bowel sounds are normal. He exhibits no distension and no mass. There is no tenderness. There is no rebound and no guarding.  Musculoskeletal: Normal range of motion. He exhibits no edema.  Lymphadenopathy:    He has no cervical adenopathy.  Neurological: He is alert and oriented to person, place, and time. He has normal reflexes. No  cranial nerve deficit. Gait normal. Coordination normal.  Skin: Skin is warm and dry. No rash noted.  Psychiatric: Mood, memory, affect and judgment normal.  Nursing note and vitals reviewed.   LABORATORY DATA:  I have reviewed the data as listed Lab Results  Component Value Date   WBC 7.0 04/24/2016   HGB 18.3 (H) 04/24/2016   HCT 50.6 04/24/2016   MCV 91.8 04/24/2016   PLT 174 04/24/2016   Results for FINNBAR, CEDILLOS (MRN [id number])   Ref. Range 04/24/2016 12:23  Sodium Latest Ref Range: 135 - 145 mmol/L 136  Potassium Latest Ref Range: 3.5 - 5.1 mmol/L 3.8  Chloride Latest Ref Range: 101 - 111 mmol/L 101  CO2 Latest Ref Range: 22 - 32 mmol/L 29  Glucose Latest Ref Range: 65 - 99 mg/dL 94  BUN Latest Ref Range: 6 - 20 mg/dL 14  Creatinine Latest Ref Range: 0.61 - 1.24 mg/dL 8.10  Calcium Latest Ref Range: 8.9 - 10.3 mg/dL 9.2  Anion gap Latest Ref Range: 5 - 15  6  Alkaline Phosphatase Latest Ref Range: 38 - 126 U/L 65  Albumin Latest Ref Range: 3.5 - 5.0 g/dL 4.2  AST Latest Ref Range: 15 - 41 U/L 38  ALT Latest Ref Range: 17 - 63 U/L 50  Total Protein Latest Ref Range: 6.5 - 8.1 g/dL 7.2  Total Bilirubin Latest Ref Range: 0.3 - 1.2 mg/dL 1.2  EGFR (African American) Latest Ref Range: >60 mL/min >60  EGFR (Non-African Amer.) Latest Ref Range: >60 mL/min >60    RADIOLOGY: I have reviewed the images below and agree with the reported results Study Result   CLINICAL DATA:  Left lower quadrant pain.  Nausea and vomiting.  EXAM: CT ABDOMEN AND PELVIS WITH CONTRAST  TECHNIQUE: Multidetector CT imaging of the abdomen and pelvis was performed using the standard protocol following bolus administration of intravenous contrast.  CONTRAST:  ISOVUE-300 IOPAMIDOL (ISOVUE-300) INJECTION 61%  COMPARISON:  CT abdomen pelvis 04/07/2012  FINDINGS: Lower chest: No pulmonary nodules. No visible pleural or pericardial effusion.  Hepatobiliary: Normal hepatic size  and contours without focal liver lesion. No perihepatic ascites. No intra- or extrahepatic biliary dilatation. Normal gallbladder.  Pancreas: Normal pancreatic contours and enhancement. No peripancreatic fluid collection or pancreatic ductal dilatation.  Spleen: Normal.  Adrenals/Urinary Tract: Normal adrenal glands. No hydronephrosis  or solid renal mass.  Stomach/Bowel: There is rectosigmoid diverticulosis. There is a focal, asymmetric area of irregular wall thickening of the mid sigmoid colon (series 2, image 71). There is mild surrounding fat stranding. No dilated loops of small bowel. The appendix is surgically absent. No abdominal fluid collection.  Vascular/Lymphatic: Normal course and caliber of the major abdominal vessels. There is mild atherosclerosis of the non aneurysmal aorta.  Reproductive: No abdominal or pelvic lymphadenopathy.  Musculoskeletal: The prostate is enlarged. Seminal vesicles are prominent. Normal visualized extrathoracic and extraperitoneal soft tissues.  Other: No contributory non-categorized findings.  IMPRESSION: 1. Focal area of wall thickening within the mid sigmoid colon with associated mild inflammatory stranding in the adjacent fat. This may indicate acute diverticulitis. However, given the somewhat masslike appearance of the wall thickening, follow-up with colonoscopy is recommended once the acute symptoms have resolved to exclude the possibility of a colonic neoplasm. 2. Enlarged prostate. 3. Aortic atherosclerosis.   Electronically Signed   By: Ulyses Jarred M.D.   On: 02/13/2016 16:09      ASSESSMENT & PLAN:  Polycythemia, secondary JAK-2 V617 F mutation negative,JAK 2 EXON 12 mutation negative, MPL mutation negative, CALR mutation negative Asthma No history of cigarette use Thrombocytopenia  He has a history of asthma but does not appear to have her have been limited by his disease. He is very physically active.  He only uses a Symbicort inhaler. He has never been hospitalized for an asthma exacerbation. He has never smoked. He has been tested for sleep apnea and was told it was negative. An erythropoietin level was checked last year and was 9.4 mIU/ml.  Testing for MPD is negative.   Labs reviewed. Results noted above. Hematocrit is 50.6 today. Accordingly I will order a phlebotomy x1. He notes he felt better previously after phlebotomy.  He notes mild headache today.  He will return for  cbc and diff in 8 weeks post-phlebotomy.    He will return to our clinic in 6 months with a cbc and ferritin. He knows he can call anytime for CBC.  He has never had a BMBX and this can be addressed in future if needed.  Orders Placed This Encounter  Procedures  . CBC with Differential    CBC w/diff after phlebotomy    Standing Status:   Future    Standing Expiration Date:   07/17/2016  . CBC with Differential    Standing Status:   Future    Standing Expiration Date:   01/22/2017  . Ferritin    Standing Status:   Future    Standing Expiration Date:   01/22/2017    This document serves as a record of services personally performed by Ancil Linsey, MD. It was created on her behalf by Shirlean Mylar, a trained medical scribe. The creation of this record is based on the scribe's personal observations and the provider's statements to them. This document has been checked and approved by the attending provider.  I have reviewed the above documentation for accuracy and completeness, and I agree with the above.  This note was electronically signed.   Kelby Fam. [place]

## 2016-04-24 NOTE — Telephone Encounter (Signed)
Does he need the one year follow up appointment around 06/01/16?

## 2016-04-24 NOTE — Telephone Encounter (Signed)
Forwarded to [facility] 

## 2016-04-24 NOTE — Patient Instructions (Addendum)
[place] at [facility] Discharge Instructions  RECOMMENDATIONS MADE BY THE CONSULTANT AND ANY TEST RESULTS WILL BE SENT TO YOUR REFERRING PHYSICIAN.  You were seen today by Dr. Whitney Muse We will get you scheduled for phlebotomy Lab work 8 weeks after phlebotomy Follow up in clinic in 6 months with labs   Thank you for choosing [place] at [facility] to provide your oncology and hematology care.  To afford each patient quality time with our provider, please arrive at least 15 minutes before your scheduled appointment time.    If you have a lab appointment with the [place] please come in thru the  Main Entrance and check in at the main information desk  You need to re-schedule your appointment should you arrive 10 or more minutes late.  We strive to give you quality time with our providers, and arriving late affects you and other patients whose appointments are after yours.  Also, if you no show three or more times for appointments you may be dismissed from the clinic at the providers discretion.     Again, thank you for choosing [facility]  Our hope is that these requests will decrease the amount of time that you wait before being seen by our physicians.       _____________________________________________________________  Should you have questions after your visit to [facility], please contact our office at [phone] between the hours of 8:30 a.m. and 4:30 p.m.  Voicemails left after 4:30 p.m. will not be returned until the following business day.  For prescription refill requests, have your pharmacy contact our office.       Resources For Cancer Patients and their Caregivers ? American Cancer Society: Can assist with transportation, wigs, general needs, runs Look Good Feel Better.        4155541686 ? Cancer Care: Provides financial assistance, online support groups, medication/co-pay  assistance.  1-800-813-HOPE 4316858123) ? [place] Assists [place] cancer patients and their families through emotional , educational and financial support.  310-579-1011 ? [place] DSS Where to apply for food stamps, Medicaid and utility assistance. (380)316-1444 ? [facility]: Transportation to medical appointments. (340)361-1421 ? Social Security Administration: May apply for disability if have a Stage IV cancer. (646)070-1821 662 494 5876 ? [employer], Disability and Transit Services: Assists with nutrition, care and transit needs. [place] Support Programs: @10RELATIVEDAYS @ > Cancer Support Group  2nd Tuesday of the month 1pm-2pm, Journey Room  > Creative Journey  3rd Tuesday of the month 1130am-1pm, Journey Room  > Look Good Feel Better  1st Wednesday of the month 10am-12 noon, Journey Room (Call [place] to register [phone])

## 2016-04-25 LAB — ERYTHROPOIETIN: Erythropoietin: 6.3 m[IU]/mL (ref 2.6–18.5)

## 2016-04-25 NOTE — Telephone Encounter (Signed)
I recommend office visit with Dr. Tama High [facility]

## 2016-04-26 DIAGNOSIS — K4090 Unilateral inguinal hernia, without obstruction or gangrene, not specified as recurrent: Secondary | ICD-10-CM | POA: Insufficient documentation

## 2016-04-26 NOTE — Telephone Encounter (Signed)
Referral to Dr. Tama High for probable inguinal hernia evaluation, previous note from office addendum attached

## 2016-04-26 NOTE — Telephone Encounter (Signed)
I do not see that a hernia was discussed at the last several OVs.  I looked back through May 2017.  Please advised diagnosis code to attach to referral. Thanks.

## 2016-04-28 ENCOUNTER — Other Ambulatory Visit: Payer: Self-pay | Admitting: Family Medicine

## 2016-05-01 ENCOUNTER — Other Ambulatory Visit (HOSPITAL_COMMUNITY): Payer: Self-pay | Admitting: *Deleted

## 2016-05-01 DIAGNOSIS — J309 Allergic rhinitis, unspecified: Secondary | ICD-10-CM | POA: Diagnosis not present

## 2016-05-01 DIAGNOSIS — D45 Polycythemia vera: Secondary | ICD-10-CM

## 2016-05-02 ENCOUNTER — Encounter (HOSPITAL_COMMUNITY)
Admission: RE | Admit: 2016-05-02 | Discharge: 2016-05-02 | Disposition: A | Discharge status: Home / Self Care | Payer: Medicare Other | Source: Ambulatory Visit | Attending: Hematology & Oncology | Admitting: Hematology & Oncology

## 2016-05-02 DIAGNOSIS — D45 Polycythemia vera: Secondary | ICD-10-CM | POA: Diagnosis not present

## 2016-05-02 NOTE — Progress Notes (Signed)
Thomas [name] presents today for phlebotomy per MD orders. HGB/HCT:18.3/50.6 Phlebotomy procedure started at 0753 and ended at 0759. 1 lb 3 oz removed. Patient tolerated procedure well. IV needle removed intact. Drank a cup of coffee.  0821-Voices no c/o at this time. D/C to home in good condition.

## 2016-05-09 DIAGNOSIS — K4090 Unilateral inguinal hernia, without obstruction or gangrene, not specified as recurrent: Secondary | ICD-10-CM | POA: Diagnosis not present

## 2016-05-10 ENCOUNTER — Encounter (HOSPITAL_COMMUNITY): Payer: Self-pay

## 2016-05-10 ENCOUNTER — Other Ambulatory Visit: Payer: Self-pay

## 2016-05-10 ENCOUNTER — Encounter (HOSPITAL_COMMUNITY)
Admission: RE | Admit: 2016-05-10 | Discharge: 2016-05-10 | Disposition: A | Discharge status: Home / Self Care | Payer: Medicare Other | Source: Ambulatory Visit | Attending: Surgery | Admitting: Surgery

## 2016-05-10 DIAGNOSIS — K4090 Unilateral inguinal hernia, without obstruction or gangrene, not specified as recurrent: Secondary | ICD-10-CM | POA: Insufficient documentation

## 2016-05-10 DIAGNOSIS — Z01818 Encounter for other preprocedural examination: Secondary | ICD-10-CM | POA: Diagnosis not present

## 2016-05-10 DIAGNOSIS — Z01812 Encounter for preprocedural laboratory examination: Secondary | ICD-10-CM | POA: Insufficient documentation

## 2016-05-10 LAB — BASIC METABOLIC PANEL
ANION GAP: 12 (ref 5–15)
BUN: 11 mg/dL (ref 6–20)
CO2: 22 mmol/L (ref 22–32)
Calcium: 8.9 mg/dL (ref 8.9–10.3)
Chloride: 104 mmol/L (ref 101–111)
Creatinine, Ser: 1.04 mg/dL (ref 0.61–1.24)
GFR calc Af Amer: >60 mL/min (ref >60)
GFR calc non Af Amer: >60 mL/min (ref >60)
GLUCOSE: 88 mg/dL (ref 65–99)
POTASSIUM: 3.5 mmol/L (ref 3.5–5.1)
Sodium: 138 mmol/L (ref 135–145)

## 2016-05-10 LAB — CBC WITH DIFFERENTIAL/PLATELET
Basophils Absolute: 0 10*3/uL (ref 0.0–0.1)
Basophils Relative: 1 %
Eosinophils Absolute: 0.2 10*3/uL (ref 0.0–0.7)
Eosinophils Relative: 4 %
HEMATOCRIT: 46.7 % (ref 39.0–52.0)
Hemoglobin: 16.9 g/dL (ref 13.0–17.0)
LYMPHS PCT: 27 %
Lymphs Abs: 1.4 10*3/uL (ref 0.7–4.0)
MCH: 33.3 pg (ref 26.0–34.0)
MCHC: 36.2 g/dL — AB (ref 30.0–36.0)
MCV: 92.1 fL (ref 78.0–100.0)
MONO ABS: 0.3 10*3/uL (ref 0.1–1.0)
Monocytes Relative: 7 %
NEUTROS ABS: 3.3 10*3/uL (ref 1.7–7.7)
Neutrophils Relative %: 61 %
Platelets: 183 10*3/uL (ref 150–400)
RBC: 5.07 MIL/uL (ref 4.22–5.81)
RDW: 13.4 % (ref 11.5–15.5)
WBC: 5.2 10*3/uL (ref 4.0–10.5)

## 2016-05-10 NOTE — H&P (Signed)
[facility] Surgical History and Physical  Subjective: 82 year old male presents upon referral from his PMD for evaluation of patient's large Left inguinal hernia. 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 Since that time, he's noticed that it appears larger and has become increasingly painful and bothersome when it "pops out". Patient reports that most times the hernia spontaneously reduces when he lays down, and other times he is able to self-reduce it by just applying gentle pressure to his Left groin. Patient denies any history of constipation, urinary straining, or coughing and denies smoking, but acknowledges use of chewing tobacco "sometimes". He requests to have the hernia repaired while his son is available to assist him before the spring planting season.   Review of Symptoms:  Constitutional:No fevers, chills, or unexplained weight loss Head:Atraumatic; no masses; no abnormalities Eyes:No visual changes or eye pain Cardiovascular: No chest pain or palpitations.  Respiratory:No cough, shortness of breath or wheezing  GastrointestinLeft inguinal hernia as per HPI; no diarrhea, constipation, blood in stools, abdominal pain, vomiting or heartburn Genitourinary:No urinary frequency, hematuria, incontinence, or dysuria Musculoskeletal:No arthalgias, myalgias or joint swelling Skin:No rash or bothersome skin lesions   Past Medical History:Reviewed  Past Medical History  Surgical History:  none Medical Problems:  HTN, HLD, CAD, asthma, thyroid nodule, hypothyroidism, GERD Allergies:   Penicillins (reaction not specified) Medications:   valsartan-HCTZ 80/12.5 mg, pravastatin, albuterol, Symbicort, levothyroxine 100 mcg, omeprazole, gabapentin,  artificial tears   Social History:Reviewed  Social History  Preferred Language: English Race:  White Ethnicity: Not Hispanic / Latino Age: 82 year Marital Status:  S Alcohol:  denies  Smoking Status: Never smoker reviewed on 05/09/2016  Functional Status ------------------------------------------------ Bathing: Normal Cooking: Normal Dressing: Normal Driving: Normal Eating: Normal Managing Meds: Normal Oral Care: Normal Shopping: Normal Toileting: Normal Transferring: Normal Walking: Normal  Cognitive Status ------------------------------------------------ Attention: Normal Decision Making: Normal Language: Normal Memory: Normal Motor: Normal Perception: Normal Problem Solving: Normal Visual and Spatial: Normal   Family History:Reviewed  Family Health History Family History is Unknown, both parents deceased  Vital Signs as of Q000111Q:  Systolic 0000000: Diastolic 69: Heart Rate 60: Temp 98.64F (Temporal) Height 54ft 8in: Weight 180Lbs 0 Ounces: BMI 27.37 kg/m2  Physical Exam: General:Well appearing, well nourished in no distress. Skin:no rash or prominent lesions Head:Atraumatic; no masses; no abnormalities Eyes:conjunctiva clear, EOM intact, PERRL Heart:RRR, no murmur Lungs:CTA bilaterally, no wheezes, rhonchi, rales.  Breathing unlabored. Abdomen:Soft, NT/ND, no HSM, no masses except easily reducible moderately tender to palpation large-opening Left inguinal hernia and asymptomatic easily reducible small Right inguinal hernia. Extremities:No deformities, clubbing, cyanosis, or edema.   Assessment: 82 year old Male with increasingly symptomatic (painful) easily reducible Left inguinal hernia without evidence of obstruction or incarceration, complicated by pertinent comorbidities including HTN, HLD, CAD, asthma, thyroid nodule, hypothyroidism, and GERD.  Plan:      - signs and symptoms of incarceration and obstruction discussed      -  strategies for self-reduction of Left inguinal hernia were reviewed      - all risks, benefits, and alternatives to open repair of increasingly symptomatic Left inguinal hernia with mesh were discussed with the patient, all of patient's questions were answered to his expressed satisfaction, and informed consent was accordingly obtained at that time      -  surgical follow-up 2 weeks after above planned procedure  -- Corene Cornea E. Rosana Hoes, MD, [place]: [facility] General Surgery [facility] Vascular [facility] Office #: [phone]

## 2016-05-10 NOTE — Patient Instructions (Addendum)
    Thomas [name]  05/10/2016     @PREFPERIOPPHARMACY @   Your procedure is scheduled on 05/14/2016.  Report to [facility] at 6:15 A.M.  Call this number if you have problems the morning of surgery:  [phone]   Remember:    Do not eat food or drink liquids after midnight.  Take these medicines the morning of surgery with A SIP OF WATER : Flonase, Neurontin, Synthroid, Prilosec and Diovan.  Please use your inhalers and nebulizers before leaving home.   Do not wear jewelry, make-up or nail polish.  Do not wear lotions, powders, or perfumes, or deoderant.  Do not shave 48 hours prior to surgery.  Men may shave face and neck.  Do not bring valuables to the hospital.  [facility] is not responsible for any belongings or valuables.  Contacts, dentures or bridgework may not be worn into surgery.  Leave your suitcase in the car.  After surgery it may be brought to your room.  For patients admitted to the hospital, discharge time will be determined by your treatment team.  Patients discharged the day of surgery will not be allowed to drive home.   Name and phone number of your driver:   family Special instructions:  n/a  Please read over the following fact sheets that you were given. Care and Recovery After Surgery

## 2016-05-14 ENCOUNTER — Ambulatory Visit (HOSPITAL_COMMUNITY): Payer: Medicare Other | Admitting: Anesthesiology

## 2016-05-14 ENCOUNTER — Ambulatory Visit (HOSPITAL_COMMUNITY)
Admission: RE | Admit: 2016-05-14 | Discharge: 2016-05-14 | Disposition: A | Discharge status: Home / Self Care | Payer: Medicare Other | Source: Ambulatory Visit | Attending: Surgery | Admitting: Surgery

## 2016-05-14 ENCOUNTER — Encounter (HOSPITAL_COMMUNITY)
Admission: RE | Disposition: A | Discharge status: Home / Self Care | Payer: Self-pay | Source: Ambulatory Visit | Attending: Surgery

## 2016-05-14 DIAGNOSIS — G4730 Sleep apnea, unspecified: Secondary | ICD-10-CM | POA: Diagnosis not present

## 2016-05-14 DIAGNOSIS — K219 Gastro-esophageal reflux disease without esophagitis: Secondary | ICD-10-CM | POA: Diagnosis not present

## 2016-05-14 DIAGNOSIS — E039 Hypothyroidism, unspecified: Secondary | ICD-10-CM | POA: Diagnosis not present

## 2016-05-14 DIAGNOSIS — E041 Nontoxic single thyroid nodule: Secondary | ICD-10-CM | POA: Insufficient documentation

## 2016-05-14 DIAGNOSIS — E785 Hyperlipidemia, unspecified: Secondary | ICD-10-CM | POA: Diagnosis not present

## 2016-05-14 DIAGNOSIS — Z880 Allergy status to penicillin: Secondary | ICD-10-CM | POA: Insufficient documentation

## 2016-05-14 DIAGNOSIS — J45909 Unspecified asthma, uncomplicated: Secondary | ICD-10-CM | POA: Diagnosis not present

## 2016-05-14 DIAGNOSIS — Z7951 Long term (current) use of inhaled steroids: Secondary | ICD-10-CM | POA: Diagnosis not present

## 2016-05-14 DIAGNOSIS — Z79899 Other long term (current) drug therapy: Secondary | ICD-10-CM | POA: Insufficient documentation

## 2016-05-14 DIAGNOSIS — K4090 Unilateral inguinal hernia, without obstruction or gangrene, not specified as recurrent: Secondary | ICD-10-CM | POA: Diagnosis not present

## 2016-05-14 DIAGNOSIS — I2510 Atherosclerotic heart disease of native coronary artery without angina pectoris: Secondary | ICD-10-CM | POA: Insufficient documentation

## 2016-05-14 DIAGNOSIS — I10 Essential (primary) hypertension: Secondary | ICD-10-CM | POA: Diagnosis not present

## 2016-05-14 HISTORY — PX: INGUINAL HERNIA REPAIR: SHX194

## 2016-05-14 SURGERY — REPAIR, HERNIA, INGUINAL, ADULT
Anesthesia: General | Laterality: Left

## 2016-05-14 MED ORDER — OXYCODONE-ACETAMINOPHEN 5-325 MG PO TABS: 1.0000 | ORAL_TABLET | ORAL | 0 refills | Status: DC | PRN

## 2016-05-14 MED ORDER — LACTATED RINGERS IV SOLN
INTRAVENOUS | Status: DC
  Administered 2016-05-14 (×2): via INTRAVENOUS

## 2016-05-14 MED ORDER — CHLORHEXIDINE GLUCONATE CLOTH 2 % EX PADS: 6.0000 | MEDICATED_PAD | Freq: Once | CUTANEOUS | Status: DC

## 2016-05-14 MED ORDER — GLYCOPYRROLATE 0.2 MG/ML IJ SOLN
INTRAMUSCULAR | Status: AC
  Filled 2016-05-14: qty 1

## 2016-05-14 MED ORDER — GLYCOPYRROLATE 0.2 MG/ML IJ SOLN
INTRAMUSCULAR | Status: DC | PRN
  Administered 2016-05-14: 0.4 mg via INTRAVENOUS

## 2016-05-14 MED ORDER — BUPIVACAINE HCL 0.5 % IJ SOLN
INTRAMUSCULAR | Status: DC | PRN
  Administered 2016-05-14 (×2): 10 mL

## 2016-05-14 MED ORDER — SUCCINYLCHOLINE CHLORIDE 20 MG/ML IJ SOLN
INTRAMUSCULAR | Status: AC
  Filled 2016-05-14: qty 1

## 2016-05-14 MED ORDER — NEOSTIGMINE METHYLSULFATE 10 MG/10ML IV SOLN
INTRAVENOUS | Status: AC
  Filled 2016-05-14: qty 1

## 2016-05-14 MED ORDER — FENTANYL CITRATE (PF) 100 MCG/2ML IJ SOLN
INTRAMUSCULAR | Status: DC | PRN
  Administered 2016-05-14 (×2): 25 ug via INTRAVENOUS

## 2016-05-14 MED ORDER — SODIUM CHLORIDE 0.9 % IJ SOLN
INTRAMUSCULAR | Status: AC
Start: 2016-05-14 — End: 2016-05-14
  Filled 2016-05-14: qty 10

## 2016-05-14 MED ORDER — SUCCINYLCHOLINE CHLORIDE 20 MG/ML IJ SOLN
INTRAMUSCULAR | Status: DC | PRN
  Administered 2016-05-14: 120 mg via INTRAVENOUS

## 2016-05-14 MED ORDER — LACTATED RINGERS IV SOLN
INTRAVENOUS | Status: DC
  Administered 2016-05-14: 07:00:00 via INTRAVENOUS

## 2016-05-14 MED ORDER — HYDROMORPHONE HCL 1 MG/ML IJ SOLN
0.2500 mg | INTRAMUSCULAR | Status: DC | PRN
  Administered 2016-05-14: 0.25 mg via INTRAVENOUS
  Administered 2016-05-14 (×2): 0.5 mg via INTRAVENOUS
  Administered 2016-05-14: 0.25 mg via INTRAVENOUS
  Administered 2016-05-14: 0.5 mg via INTRAVENOUS
  Filled 2016-05-14 (×4): qty 0.5

## 2016-05-14 MED ORDER — NEOSTIGMINE METHYLSULFATE 10 MG/10ML IV SOLN
INTRAVENOUS | Status: DC | PRN
  Administered 2016-05-14: 2 mg via INTRAVENOUS

## 2016-05-14 MED ORDER — MIDAZOLAM HCL 2 MG/2ML IJ SOLN
0.5000 mg | INTRAMUSCULAR | Status: DC | PRN
  Administered 2016-05-14: 2 mg via INTRAVENOUS
  Filled 2016-05-14: qty 2

## 2016-05-14 MED ORDER — LIDOCAINE HCL (PF) 1 % IJ SOLN
INTRAMUSCULAR | Status: AC
  Filled 2016-05-14: qty 30

## 2016-05-14 MED ORDER — ROCURONIUM BROMIDE 50 MG/5ML IV SOLN
INTRAVENOUS | Status: AC
  Filled 2016-05-14: qty 1

## 2016-05-14 MED ORDER — LIDOCAINE HCL (PF) 1 % IJ SOLN
INTRAMUSCULAR | Status: AC
  Filled 2016-05-14: qty 5

## 2016-05-14 MED ORDER — ROCURONIUM BROMIDE 100 MG/10ML IV SOLN
INTRAVENOUS | Status: DC | PRN
  Administered 2016-05-14: 5 mg via INTRAVENOUS
  Administered 2016-05-14: 25 mg via INTRAVENOUS

## 2016-05-14 MED ORDER — VANCOMYCIN HCL IN DEXTROSE 1-5 GM/200ML-% IV SOLN
1000.0000 mg | INTRAVENOUS | Status: AC
  Administered 2016-05-14: 1000 mg via INTRAVENOUS
  Filled 2016-05-14: qty 200

## 2016-05-14 MED ORDER — EPHEDRINE SULFATE 50 MG/ML IJ SOLN
INTRAMUSCULAR | Status: DC | PRN
  Administered 2016-05-14: 5 mg via INTRAVENOUS

## 2016-05-14 MED ORDER — FENTANYL CITRATE (PF) 100 MCG/2ML IJ SOLN
INTRAMUSCULAR | Status: AC
  Filled 2016-05-14: qty 2

## 2016-05-14 MED ORDER — LIDOCAINE HCL (CARDIAC) 10 MG/ML IV SOLN
INTRAVENOUS | Status: DC | PRN
  Administered 2016-05-14: 50 mg via INTRAVENOUS

## 2016-05-14 MED ORDER — ARTIFICIAL TEARS OP OINT
TOPICAL_OINTMENT | OPHTHALMIC | Status: AC
  Filled 2016-05-14: qty 3.5

## 2016-05-14 MED ORDER — BUPIVACAINE HCL (PF) 0.5 % IJ SOLN
INTRAMUSCULAR | Status: AC
  Filled 2016-05-14: qty 30

## 2016-05-14 MED ORDER — ETOMIDATE 2 MG/ML IV SOLN
INTRAVENOUS | Status: AC
  Filled 2016-05-14: qty 10

## 2016-05-14 MED ORDER — GLYCOPYRROLATE 0.2 MG/ML IJ SOLN
INTRAMUSCULAR | Status: AC
  Filled 2016-05-14: qty 3

## 2016-05-14 MED ORDER — PROPOFOL 10 MG/ML IV BOLUS
INTRAVENOUS | Status: DC | PRN
  Administered 2016-05-14: 130 mg via INTRAVENOUS
  Administered 2016-05-14: 20 mg via INTRAVENOUS

## 2016-05-14 MED ORDER — EPHEDRINE SULFATE 50 MG/ML IJ SOLN
INTRAMUSCULAR | Status: AC
  Filled 2016-05-14: qty 1

## 2016-05-14 MED ORDER — SODIUM CHLORIDE 0.9 % IR SOLN
Status: DC | PRN
  Administered 2016-05-14: 1000 mL

## 2016-05-14 SURGICAL SUPPLY — 45 items
ADH SKN CLS APL DERMABOND .7 (GAUZE/BANDAGES/DRESSINGS) ×1
BAG HAMPER (MISCELLANEOUS) ×3 IMPLANT
CHLORAPREP W/TINT 26ML (MISCELLANEOUS) ×3 IMPLANT
CLOTH BEACON ORANGE TIMEOUT ST (SAFETY) ×3 IMPLANT
COVER LIGHT HANDLE STERIS (MISCELLANEOUS) ×6 IMPLANT
DECANTER SPIKE VIAL GLASS SM (MISCELLANEOUS) ×5 IMPLANT
DERMABOND ADVANCED (GAUZE/BANDAGES/DRESSINGS) ×2
DERMABOND ADVANCED .7 DNX12 (GAUZE/BANDAGES/DRESSINGS) ×1 IMPLANT
DRAIN PENROSE 18X.75 LTX STRL (MISCELLANEOUS) ×3 IMPLANT
ELECT REM PT RETURN 9FT ADLT (ELECTROSURGICAL) ×3
ELECTRODE REM PT RTRN 9FT ADLT (ELECTROSURGICAL) ×1 IMPLANT
FORMALIN 10 PREFIL 120ML (MISCELLANEOUS) ×1 IMPLANT
GLOVE BIOGEL PI IND STRL 7.0 (GLOVE) IMPLANT
GLOVE BIOGEL PI IND STRL 7.5 (GLOVE) ×1 IMPLANT
GLOVE BIOGEL PI INDICATOR 7.0 (GLOVE) ×4
GLOVE BIOGEL PI INDICATOR 7.5 (GLOVE) ×2
GLOVE ECLIPSE 6.5 STRL STRAW (GLOVE) ×4 IMPLANT
GLOVE ECLIPSE 7.0 STRL STRAW (GLOVE) ×3 IMPLANT
GLOVE EXAM NITRILE MD LF STRL (GLOVE) ×2 IMPLANT
GOWN STRL REUS W/TWL LRG LVL3 (GOWN DISPOSABLE) ×9 IMPLANT
INST SET MINOR GENERAL (KITS) ×3 IMPLANT
KIT ROOM TURNOVER APOR (KITS) ×3 IMPLANT
MANIFOLD NEPTUNE II (INSTRUMENTS) ×3 IMPLANT
MESH HERNIA 1.6X1.9 PLUG LRG (Mesh General) IMPLANT
MESH HERNIA PLUG LRG (Mesh General) ×2 IMPLANT
MESH MARLEX PLUG MEDIUM (Mesh General) ×2 IMPLANT
NDL HYPO 21X1.5 SAFETY (NEEDLE) ×1 IMPLANT
NEEDLE HYPO 21X1.5 SAFETY (NEEDLE) ×3 IMPLANT
NS IRRIG 1000ML POUR BTL (IV SOLUTION) ×3 IMPLANT
PACK MINOR (CUSTOM PROCEDURE TRAY) ×3 IMPLANT
PAD ARMBOARD 7.5X6 YLW CONV (MISCELLANEOUS) ×3 IMPLANT
SET BASIN LINEN APH (SET/KITS/TRAYS/PACK) ×3 IMPLANT
SPONGE LAP 18X18 X RAY DECT (DISPOSABLE) ×2 IMPLANT
SUT ETHIBOND CT1 BRD 2-0 30IN (SUTURE) ×3 IMPLANT
SUT ETHIBOND NAB MO 7 #0 18IN (SUTURE) ×3 IMPLANT
SUT MNCRL AB 4-0 PS2 18 (SUTURE) ×2 IMPLANT
SUT SILK 2 0 (SUTURE)
SUT SILK 2-0 18XBRD TIE 12 (SUTURE) IMPLANT
SUT VIC AB 2-0 CT1 27 (SUTURE) ×3
SUT VIC AB 2-0 CT1 TAPERPNT 27 (SUTURE) ×1 IMPLANT
SUT VIC AB 3-0 SH 27 (SUTURE) ×3
SUT VIC AB 3-0 SH 27X BRD (SUTURE) ×1 IMPLANT
SUT VIC AB 4-0 PS2 27 (SUTURE) ×3 IMPLANT
SUT VICRYL AB 3 0 TIES (SUTURE) ×2 IMPLANT
SYR CONTROL 10ML LL (SYRINGE) ×3 IMPLANT

## 2016-05-14 NOTE — Anesthesia Preprocedure Evaluation (Signed)
Anesthesia Evaluation   Patient awake    Reviewed: Allergy & Precautions, H&P , NPO status , Patient's Chart, lab work & pertinent test results  History of Anesthesia Complications Negative for: history of anesthetic complications  Airway Mallampati: II  TM Distance: >3 FB Neck ROM: Full    Dental  (+) Edentulous Upper, Edentulous Lower   Pulmonary asthma , sleep apnea ,    breath sounds clear to auscultation       Cardiovascular hypertension, Pt. on medications + CAD   Rhythm:Regular Rate:Normal     Neuro/Psych negative neurological ROS     GI/Hepatic Neg liver ROS, hiatal hernia, GERD  Medicated and Controlled,  Endo/Other  Hypothyroidism   Renal/GU Renal disease     Musculoskeletal   Abdominal   Peds  Hematology negative hematology ROS (+)   Anesthesia Other Findings   Reproductive/Obstetrics negative OB ROS                             Anesthesia Physical Anesthesia Plan  ASA: III  Anesthesia Plan: General   Post-op Pain Management:    Induction: Intravenous, Rapid sequence and Cricoid pressure planned  Airway Management Planned: Oral ETT  Additional Equipment:   Intra-op Plan:   Post-operative Plan: Extubation in OR  Informed Consent: I have reviewed the patients History and Physical, chart, labs and discussed the procedure including the risks, benefits and alternatives for the proposed anesthesia with the patient or authorized representative who has indicated his/her understanding and acceptance.     Plan Discussed with:   Anesthesia Plan Comments:         Anesthesia Quick Evaluation

## 2016-05-14 NOTE — Op Note (Signed)
SURGICAL OPERATIVE REPORT  DATE OF PROCEDURE: 05/14/2016  ATTENDING Surgeon(s): Vickie Epley, MD  ANESTHESIA: GETA  PRE-OPERATIVE DIAGNOSIS: Symptomatic (painful) reducible Left inguinal hernia (icd-10: K40.90)  POST-OPERATIVE DIAGNOSIS: Symptomatic (painful) reducible Left inguinal hernia (icd-10: K40.90)  PROCEDURE(S):  1.) Repair of symptomatic (painful) reducible Left inguinal hernia with mesh (cptBV:[id number])  INTRAOPERATIVE FINDINGS: Large indirect and moderately large direct (pantaloon) Left inguinal hernia containing peritoneal fat at the time of surgery  INTRAVENOUS FLUIDS: 1300 mL crystalloid   ESTIMATED BLOOD LOSS: Minimal (<20 mL)  URINE OUTPUT: No foley  SPECIMENS: None  IMPLANTS: Bard PerFix mesh plugs (Large to direct and Medium to indirect) and Bard Soft polypropylene mesh patch  DRAINS: None   COMPLICATIONS: None apparent   CONDITION AT END OF PROCEDURE: Hemodynamically stable and extubated   DISPOSITION OF PATIENT: PACU  INDICATIONS FOR PROCEDURE:  Patient is a 82 y.o. male who presented with Left groin pain x at least 8 years, becoming more symptomatic/painful over the past 2 years. On exam, a Left inguinal hernia was identified, contributing factors were assessed, and operative repair was offered. All risks, benefits, and alternatives to above procedure were discussed with the patient, all of patient's questions were answered to his expressed satisfaction, and informed consent was obtained and documented.  DETAILS OF PROCEDURE: Patient was brought to the operating suite and appropriately identified. General anesthesia was administered along with appropriate pre-operative antibiotics, and endotracheal intubation was performed by anesthetist. In supine position, operative site was prepped and draped in the usual sterile fashion, and following a brief time out, local anesthetic was injected over the planned incision site and initial transverse skin incision  was made along a natural skin crease using a #15 blade scalpel 1-2 cm superior to the inguinal ligament as determined by identifying and marking the anterior superior iliac spine and pubic tubercle. This incision was then extended deep through subcutaneous tissues, Scarpa's and Camper's fascia, using electrocautery until external oblique aponeurosis was encountered and the external ring was exposed. A small incision was made in the mid- external oblique aponeurosis in the direction of its fibers, elevated off underlying structures, and extended medially and laterally. The ilioinguinal nerve was identified and protected throughout the remainder of dissection and procedure, and superior and inferior external oblique flaps were developed bluntly.  The spermatic cord was safely encircled with a penrose drain and carefully dissected free from the anteromedial hernia sac, which was reduced and dissected free from the surrounding adherent floor of the inguinal canal, which appeared grossly weakened with a well-defined defect. A large polypropylene mesh plug was inserted into the direct hernia defect, a second medium polypropylene mesh plug was inserted into the indirect defect, and an appropriately fashioned mesh patch was sutured inferiorly to the shelving edge of the inguinal ligament, starting medially at the pubic tubercle, using interrupted Ethibond suture and superiorly interrupted Ethibond suture(s) to the conjoint tendon/transversalis muscle/fascia. Care was taken to assure the mesh was placed in a relaxed fashion without excessive tension and without any neurovascular structures incorporated into the repair. Laterally, the tails of the mesh were then crossed to recreate the internal ring. Hemostasis was confirmed, and the incision was closed in multiple layers using a running 2-0 Vicryl suture to re-approximate the external oblique aponeurosis, interrupted 3-0 Vicryl deep dermal sutures, and running 4-0 Monocryl  subcuticular suture/surgical skin staples to re-approximate epidermis. The skin was then cleaned, dried, and sterile Dermabond skin glue was applied. Patient was then safely able to be extubated,  awakened, and transferred to PACU for post-operative monitoring and care.  I was present for all aspects of the above procedure, and there were no complications apparent.

## 2016-05-14 NOTE — Transfer of Care (Signed)
Immediate Anesthesia Transfer of Care Note  Patient: Thomas [name]  Procedure(s) Performed: Procedure(s): OPEN REPAIR OF SYMPTOMATIC (PAINFUL) LEFT INGUINAL HERNIA WITH MESH (Left)  Patient Location: PACU  Anesthesia Type:General  Level of Consciousness: sedated and patient cooperative  Airway & Oxygen Therapy: Patient Spontanous Breathing and non-rebreather face mask  Post-op Assessment: Report given to RN and Post -op Vital signs reviewed and stable  Post vital signs: Reviewed and stable  Last Vitals:  Vitals:   05/14/16 0710 05/14/16 0720  BP:  137/84  Pulse:    Resp: 19 (!) 26  Temp:      Last Pain:  Vitals:   05/14/16 0648  TempSrc: Oral      Patients Stated Pain Goal: 6 ([id number] Q000111Q)  Complications: No apparent anesthesia complications

## 2016-05-14 NOTE — H&P (Signed)
[facility] Surgical History and Physical  Subjective: 82 year old male presents upon referral from his PMD for evaluation of patient's large Left inguinal hernia. 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 Since that time, he's noticed that it appears larger and has become increasingly painful and bothersome when it "pops out". Patient reports that most times the hernia spontaneously reduces when he lays down, and other times he is able to self-reduce it by just applying gentle pressure to his Left groin. Patient denies any history of constipation, urinary straining, or coughing and denies smoking, but acknowledges use of chewing tobacco "sometimes". He requests to have the hernia repaired while his son is available to assist him before the spring planting season.   Review of Symptoms:  Constitutional:No fevers, chills, or unexplained weight loss Head:Atraumatic; no masses; no abnormalities Eyes:No visual changes or eye pain Cardiovascular: No chest pain or palpitations.  Respiratory:No cough, shortness of breath or wheezing  GastrointestinLeft inguinal hernia as per HPI; no diarrhea, constipation, blood in stools, abdominal pain, vomiting or heartburn Genitourinary:No urinary frequency, hematuria, incontinence, or dysuria Musculoskeletal:No arthalgias, myalgias or joint swelling Skin:No rash or bothersome skin lesions   Past Medical History:Reviewed  Past Medical History  Surgical History:  none Medical Problems:  HTN, HLD, CAD, asthma, thyroid nodule, hypothyroidism, GERD Allergies:   Penicillins (reaction not specified) Medications:   valsartan-HCTZ 80/12.5 mg, pravastatin, albuterol, Symbicort, levothyroxine 100 mcg, omeprazole, gabapentin,  artificial tears   Social History:Reviewed  Social History  Preferred Language: English Race:  White Ethnicity: Not Hispanic / Latino Age: 45 year Marital Status:  S Alcohol:  denies  Smoking Status: Never smoker reviewed on 05/09/2016  Functional Status ------------------------------------------------ Bathing: Normal Cooking: Normal Dressing: Normal Driving: Normal Eating: Normal Managing Meds: Normal Oral Care: Normal Shopping: Normal Toileting: Normal Transferring: Normal Walking: Normal  Cognitive Status ------------------------------------------------ Attention: Normal Decision Making: Normal Language: Normal Memory: Normal Motor: Normal Perception: Normal Problem Solving: Normal Visual and Spatial: Normal   Family History:Reviewed  Family Health History Family History is Unknown  Vital Signs as of Q000111Q:  Systolic 0000000: Diastolic 69: Heart Rate 60: Temp 98.27F (Temporal) Height 62ft 8in: Weight 180Lbs 0 Ounces: BMI 27.37 kg/m2  Physical Exam: General:Well appearing, well nourished in no distress. Skin:no rash or prominent lesions Head:Atraumatic; no masses; no abnormalities Eyes:conjunctiva clear, EOM intact, PERRL Heart:RRR, no murmur Lungs:CTA bilaterally, no wheezes, rhonchi, rales.  Breathing unlabored. Abdomen:Soft, NT/ND, no HSM, no masses except easily reducible moderately tender to palpation large-opening Left inguinal hernia and asymptomatic easily reducible small Right inguinal hernia. Extremities:No deformities, clubbing, cyanosis, or edema.   Assessment: 82 year old Male with increasingly symptomatic (painful) easily reducible Left inguinal hernia without evidence of obstruction or incarceration, complicated by pertinent comorbidities including HTN, HLD, CAD, asthma, thyroid nodule, hypothyroidism, and GERD.  Plan:      - signs and symptoms of incarceration and obstruction discussed      - strategies for  self-reduction of Left inguinal hernia were reviewed      - all risks, benefits, and alternatives to open repair of increasingly symptomatic Left inguinal hernia with mesh were discussed with the patient, all of patient's questions were answered to his expressed satisfaction, and informed consent was accordingly obtained at that time      -  surgical follow-up 2 weeks after above planned procedure  -- Corene Cornea E. Rosana Hoes, MD, [place]: [facility] General Surgery and Vascular Care Office #: [phone]

## 2016-05-14 NOTE — Anesthesia Procedure Notes (Signed)
Procedure Name: Intubation Date/Time: 05/14/2016 7:46 AM Performed by: Vista Deck Pre-anesthesia Checklist: Patient identified, Patient being monitored, Timeout performed, Emergency Drugs available and Suction available Patient Re-evaluated:Patient Re-evaluated prior to inductionOxygen Delivery Method: Circle System Utilized Preoxygenation: Pre-oxygenation with 100% oxygen Intubation Type: IV induction, Rapid sequence and Cricoid Pressure applied Ventilation: Mask ventilation without difficulty Laryngoscope Size: Miller and 2 Grade View: Grade I Tube type: Oral Tube size: 7.0 mm Number of attempts: 1 Airway Equipment and Method: Stylet and Oral airway Placement Confirmation: ETT inserted through vocal cords under direct vision,  positive ETCO2 and breath sounds checked- equal and bilateral Secured at: 22 cm Tube secured with: Tape Dental Injury: Teeth and Oropharynx as per pre-operative assessment

## 2016-05-14 NOTE — Anesthesia Postprocedure Evaluation (Signed)
Anesthesia Post Note  Patient: Thomas [name]  Procedure(s) Performed: Procedure(s) (LRB): OPEN REPAIR OF SYMPTOMATIC (PAINFUL) LEFT INGUINAL HERNIA WITH MESH (Left)  Patient location during evaluation: PACU Anesthesia Type: General Level of consciousness: awake Pain management: satisfactory to patient Vital Signs Assessment: post-procedure vital signs reviewed and stable Respiratory status: spontaneous breathing Cardiovascular status: stable Anesthetic complications: no     Last Vitals:  Vitals:   05/14/16 1015 05/14/16 1023  BP: (!) 145/81   Pulse: (!) 51 (!) 56  Resp: 11 14  Temp:      Last Pain:  Vitals:   05/14/16 1013  TempSrc:   PainSc: 6                  Nike Southwell

## 2016-05-14 NOTE — Discharge Instructions (Signed)
In addition to included general post-operative instructions for Open Repair of Left Inguinal Hernia with Mesh,  Diet: Resume home heart healthy diet.   Activity: No heavy lifting >20 pounds (children, pets, laundry, garbage) or strenuous activity until follow-up, but light activity and walking are encouraged. Do not drive or drink alcohol if taking narcotic pain medications.  Wound care: 2 days after surgery (Wednesday, 1/31), may shower/get incision wet with soapy water and pat dry (do not rub incisions), but no baths or submerging incision underwater until follow-up.   Medications: Resume all home medications. For mild to moderate pain: acetaminophen (Tylenol) or ibuprofen (if no kidney disease). Narcotic pain medications, if prescribed, can be used for severe pain, though may cause nausea, constipation, and drowsiness. Do not combine Tylenol and Percocet within a 6 hour period as Percocet contains Tylenol. If you do not need the narcotic pain medication, you do not need to fill the prescription.  Call office [phone]) at any time if any questions, worsening pain, fevers/chills, bleeding, drainage from incision site, or other concerns.

## 2016-05-15 ENCOUNTER — Encounter (HOSPITAL_COMMUNITY): Payer: Self-pay | Admitting: Surgery

## 2016-05-16 ENCOUNTER — Encounter (HOSPITAL_COMMUNITY): Payer: Self-pay | Admitting: Hematology & Oncology

## 2016-06-05 DIAGNOSIS — J309 Allergic rhinitis, unspecified: Secondary | ICD-10-CM | POA: Diagnosis not present

## 2016-06-19 ENCOUNTER — Other Ambulatory Visit: Payer: Self-pay | Admitting: Family Medicine

## 2016-06-26 ENCOUNTER — Encounter (HOSPITAL_COMMUNITY): Payer: Medicare Other | Attending: Oncology

## 2016-06-26 DIAGNOSIS — D45 Polycythemia vera: Secondary | ICD-10-CM | POA: Diagnosis not present

## 2016-06-26 LAB — CBC WITH DIFFERENTIAL/PLATELET
BASOS ABS: 0 10*3/uL (ref 0.0–0.1)
Basophils Relative: 0 %
Eosinophils Absolute: 0.2 10*3/uL (ref 0.0–0.7)
Eosinophils Relative: 4 %
HEMATOCRIT: 49.9 % (ref 39.0–52.0)
HEMOGLOBIN: 17.4 g/dL — AB (ref 13.0–17.0)
LYMPHS PCT: 27 %
Lymphs Abs: 1.6 10*3/uL (ref 0.7–4.0)
MCH: 32.6 pg (ref 26.0–34.0)
MCHC: 34.9 g/dL (ref 30.0–36.0)
MCV: 93.6 fL (ref 78.0–100.0)
MONO ABS: 0.5 10*3/uL (ref 0.1–1.0)
Monocytes Relative: 8 %
NEUTROS ABS: 3.5 10*3/uL (ref 1.7–7.7)
NEUTROS PCT: 61 %
Platelets: 174 10*3/uL (ref 150–400)
RBC: 5.33 MIL/uL (ref 4.22–5.81)
RDW: 13.4 % (ref 11.5–15.5)
WBC: 5.8 10*3/uL (ref 4.0–10.5)

## 2016-07-02 ENCOUNTER — Ambulatory Visit (INDEPENDENT_AMBULATORY_CARE_PROVIDER_SITE_OTHER): Payer: Medicare Other | Admitting: Otolaryngology

## 2016-07-02 DIAGNOSIS — J343 Hypertrophy of nasal turbinates: Secondary | ICD-10-CM

## 2016-07-02 DIAGNOSIS — J310 Chronic rhinitis: Secondary | ICD-10-CM

## 2016-07-03 DIAGNOSIS — J309 Allergic rhinitis, unspecified: Secondary | ICD-10-CM | POA: Diagnosis not present

## 2016-07-05 ENCOUNTER — Ambulatory Visit (INDEPENDENT_AMBULATORY_CARE_PROVIDER_SITE_OTHER): Payer: Medicare Other | Admitting: Otolaryngology

## 2016-07-05 DIAGNOSIS — H903 Sensorineural hearing loss, bilateral: Secondary | ICD-10-CM | POA: Diagnosis not present

## 2016-07-09 ENCOUNTER — Ambulatory Visit (INDEPENDENT_AMBULATORY_CARE_PROVIDER_SITE_OTHER): Payer: Medicare Other | Admitting: Otolaryngology

## 2016-07-10 DIAGNOSIS — J309 Allergic rhinitis, unspecified: Secondary | ICD-10-CM | POA: Diagnosis not present

## 2016-07-18 ENCOUNTER — Other Ambulatory Visit: Payer: Self-pay | Admitting: Family Medicine

## 2016-07-19 DIAGNOSIS — J309 Allergic rhinitis, unspecified: Secondary | ICD-10-CM | POA: Diagnosis not present

## 2016-07-31 DIAGNOSIS — J309 Allergic rhinitis, unspecified: Secondary | ICD-10-CM | POA: Diagnosis not present

## 2016-08-14 DIAGNOSIS — J309 Allergic rhinitis, unspecified: Secondary | ICD-10-CM | POA: Diagnosis not present

## 2016-08-28 DIAGNOSIS — J309 Allergic rhinitis, unspecified: Secondary | ICD-10-CM | POA: Diagnosis not present

## 2016-09-11 ENCOUNTER — Ambulatory Visit (INDEPENDENT_AMBULATORY_CARE_PROVIDER_SITE_OTHER): Payer: Medicare Other | Admitting: Family Medicine

## 2016-09-11 VITALS — BP 128/76 | Ht 68.0 in | Wt 179.0 lb

## 2016-09-11 DIAGNOSIS — E785 Hyperlipidemia, unspecified: Secondary | ICD-10-CM | POA: Diagnosis not present

## 2016-09-11 DIAGNOSIS — D45 Polycythemia vera: Secondary | ICD-10-CM

## 2016-09-11 DIAGNOSIS — E038 Other specified hypothyroidism: Secondary | ICD-10-CM | POA: Diagnosis not present

## 2016-09-11 DIAGNOSIS — I10 Essential (primary) hypertension: Secondary | ICD-10-CM

## 2016-09-11 DIAGNOSIS — K219 Gastro-esophageal reflux disease without esophagitis: Secondary | ICD-10-CM

## 2016-09-11 DIAGNOSIS — J309 Allergic rhinitis, unspecified: Secondary | ICD-10-CM | POA: Diagnosis not present

## 2016-09-11 MED ORDER — OMEPRAZOLE 20 MG PO CPDR: DELAYED_RELEASE_CAPSULE | ORAL | 1 refills | Status: DC

## 2016-09-11 MED ORDER — LEVOTHYROXINE SODIUM 100 MCG PO TABS: ORAL_TABLET | ORAL | 1 refills | Status: DC

## 2016-09-11 MED ORDER — GABAPENTIN 400 MG PO CAPS: 400.0000 mg | ORAL_CAPSULE | Freq: Three times a day (TID) | ORAL | 12 refills | Status: DC

## 2016-09-11 MED ORDER — PRAVASTATIN SODIUM 40 MG PO TABS: ORAL_TABLET | ORAL | 1 refills | Status: DC

## 2016-09-11 NOTE — Progress Notes (Signed)
   Subjective:    Patient ID: Thomas [name], male    DOB: [birth year], 82 y.o.   MRN: [id number]  Hyperlipidemia  This is a chronic problem. The current episode started more than 1 year ago. Pertinent negatives include no chest pain or shortness of breath. Treatments tried: pravastatin. There are no compliance problems.    Pt states no concerns today.  Patient states his breathing is doing good he is tolerating his medications will have and use albuterol very rarely Takes his cholesterol medicine regular basis denies any side effects previous labs reviewed Takes his thyroid medicine energy level overall doing well Has polycythemia-will need his hemoglobin rechecked Patient denies any cardiac symptoms  Left ing hernia repair with tightness  Review of Systems  Constitutional: Negative for activity change, fatigue and fever.  Respiratory: Negative for cough, shortness of breath and wheezing.   Cardiovascular: Negative for chest pain and leg swelling.  Neurological: Negative for headaches.       Objective:   Physical Exam  Constitutional: He appears well-nourished. No distress.  Cardiovascular: Normal rate, regular rhythm and normal heart sounds.   No murmur heard. Pulmonary/Chest: Effort normal and breath sounds normal. No respiratory distress.  Musculoskeletal: He exhibits no edema.  Lymphadenopathy:    He has no cervical adenopathy.  Neurological: He is alert.  Psychiatric: His behavior is normal.  Vitals reviewed.         Assessment & Plan:  HTN good control continue current medications check metabolic 7 Continue cholesterol medicine previous labs reviewed Labs ordered Hypothyroidism recheck TSH continue medication Reactive airway overall doing well continue current measures  Polycythemia vera check CBC follow-up with hematology in summer Follow-up here 6 months

## 2016-09-12 DIAGNOSIS — E038 Other specified hypothyroidism: Secondary | ICD-10-CM | POA: Diagnosis not present

## 2016-09-12 DIAGNOSIS — D45 Polycythemia vera: Secondary | ICD-10-CM | POA: Diagnosis not present

## 2016-09-12 DIAGNOSIS — I10 Essential (primary) hypertension: Secondary | ICD-10-CM | POA: Diagnosis not present

## 2016-09-12 DIAGNOSIS — E785 Hyperlipidemia, unspecified: Secondary | ICD-10-CM | POA: Diagnosis not present

## 2016-09-13 LAB — BASIC METABOLIC PANEL
BUN / CREAT RATIO: 10 (ref 10–24)
BUN: 11 mg/dL (ref 8–27)
CHLORIDE: 102 mmol/L (ref 96–106)
CO2: 24 mmol/L (ref 18–29)
CREATININE: 1.13 mg/dL (ref 0.76–1.27)
Calcium: 9.5 mg/dL (ref 8.6–10.2)
GFR calc non Af Amer: 61 mL/min/{1.73_m2} (ref >59)
GFR, EST AFRICAN AMERICAN: 71 mL/min/{1.73_m2} (ref >59)
Glucose: 106 mg/dL — ABNORMAL HIGH (ref 65–99)
Potassium: 4.3 mmol/L (ref 3.5–5.2)
SODIUM: 143 mmol/L (ref 134–144)

## 2016-09-13 LAB — CBC WITH DIFFERENTIAL/PLATELET
BASOS ABS: 0 10*3/uL (ref 0.0–0.2)
BASOS: 0 %
EOS (ABSOLUTE): 0.2 10*3/uL (ref 0.0–0.4)
Eos: 4 %
HEMATOCRIT: 52 % — AB (ref 37.5–51.0)
HEMOGLOBIN: 18.1 g/dL — AB (ref 13.0–17.7)
IMMATURE GRANS (ABS): 0 10*3/uL (ref 0.0–0.1)
Immature Granulocytes: 0 %
LYMPHS ABS: 1.3 10*3/uL (ref 0.7–3.1)
Lymphs: 28 %
MCH: 32.4 pg (ref 26.6–33.0)
MCHC: 34.8 g/dL (ref 31.5–35.7)
MCV: 93 fL (ref 79–97)
MONOCYTES: 7 %
Monocytes Absolute: 0.3 10*3/uL (ref 0.1–0.9)
NEUTROS ABS: 2.9 10*3/uL (ref 1.4–7.0)
Neutrophils: 61 %
Platelets: 172 10*3/uL (ref 150–379)
RBC: 5.58 x10E6/uL (ref 4.14–5.80)
RDW: 14.1 % (ref 12.3–15.4)
WBC: 4.7 10*3/uL (ref 3.4–10.8)

## 2016-09-13 LAB — LIPID PANEL
CHOL/HDL RATIO: 3.5 ratio (ref 0.0–5.0)
Cholesterol, Total: 148 mg/dL (ref 100–199)
HDL: 42 mg/dL (ref >39)
LDL Calculated: 79 mg/dL (ref 0–99)
TRIGLYCERIDES: 133 mg/dL (ref 0–149)
VLDL CHOLESTEROL CAL: 27 mg/dL (ref 5–40)

## 2016-09-13 LAB — TSH: TSH: 0.987 u[IU]/mL (ref 0.450–4.500)

## 2016-09-17 ENCOUNTER — Encounter (HOSPITAL_COMMUNITY): Payer: Self-pay | Admitting: Oncology

## 2016-09-17 ENCOUNTER — Other Ambulatory Visit (HOSPITAL_COMMUNITY): Payer: Self-pay | Admitting: Oncology

## 2016-09-17 DIAGNOSIS — D751 Secondary polycythemia: Secondary | ICD-10-CM

## 2016-09-24 ENCOUNTER — Encounter (HOSPITAL_COMMUNITY)
Admission: RE | Admit: 2016-09-24 | Discharge: 2016-09-24 | Disposition: A | Discharge status: Home / Self Care | Payer: Medicare Other | Source: Ambulatory Visit | Attending: Oncology | Admitting: Oncology

## 2016-09-24 DIAGNOSIS — D751 Secondary polycythemia: Secondary | ICD-10-CM | POA: Diagnosis not present

## 2016-09-24 NOTE — Progress Notes (Signed)
Thomas [name] presents today for phlebotomy per MD orders. HGB/HCT:18.1/52.0 Phlebotomy procedure started at 0810 and ended at 0820. 17 oz removed. Patient tolerated procedure well. IV needle removed intact. Drsg to site. Saltines and given to eat. Ginger-ale given to drink. Tolerated well.  0846-D/C to home in good condition.

## 2016-10-09 DIAGNOSIS — J309 Allergic rhinitis, unspecified: Secondary | ICD-10-CM | POA: Diagnosis not present

## 2016-10-22 ENCOUNTER — Ambulatory Visit (HOSPITAL_COMMUNITY): Payer: Medicare Other | Admitting: Adult Health

## 2016-10-22 ENCOUNTER — Other Ambulatory Visit (HOSPITAL_COMMUNITY): Payer: Self-pay | Admitting: *Deleted

## 2016-10-22 ENCOUNTER — Other Ambulatory Visit (HOSPITAL_COMMUNITY): Payer: Medicare Other

## 2016-10-22 DIAGNOSIS — D45 Polycythemia vera: Secondary | ICD-10-CM

## 2016-10-23 ENCOUNTER — Other Ambulatory Visit (HOSPITAL_COMMUNITY): Payer: Self-pay | Admitting: Emergency Medicine

## 2016-10-23 ENCOUNTER — Encounter (HOSPITAL_COMMUNITY): Payer: Self-pay | Admitting: Oncology

## 2016-10-23 ENCOUNTER — Ambulatory Visit (HOSPITAL_COMMUNITY): Payer: Medicare Other | Admitting: Adult Health

## 2016-10-23 ENCOUNTER — Encounter (HOSPITAL_BASED_OUTPATIENT_CLINIC_OR_DEPARTMENT_OTHER): Payer: Medicare Other | Admitting: Oncology

## 2016-10-23 ENCOUNTER — Encounter (HOSPITAL_COMMUNITY): Payer: Medicare Other | Attending: Oncology

## 2016-10-23 VITALS — BP 126/70 | HR 52 | Resp 16 | Ht 68.0 in | Wt 179.0 lb

## 2016-10-23 DIAGNOSIS — D751 Secondary polycythemia: Secondary | ICD-10-CM

## 2016-10-23 DIAGNOSIS — D45 Polycythemia vera: Secondary | ICD-10-CM | POA: Diagnosis not present

## 2016-10-23 DIAGNOSIS — D696 Thrombocytopenia, unspecified: Secondary | ICD-10-CM

## 2016-10-23 LAB — CBC WITH DIFFERENTIAL/PLATELET
BASOS ABS: 0 10*3/uL (ref 0.0–0.1)
BASOS PCT: 1 %
Eosinophils Absolute: 0.2 10*3/uL (ref 0.0–0.7)
Eosinophils Relative: 3 %
HEMATOCRIT: 47.9 % (ref 39.0–52.0)
HEMOGLOBIN: 17 g/dL (ref 13.0–17.0)
Lymphocytes Relative: 23 %
Lymphs Abs: 1.2 10*3/uL (ref 0.7–4.0)
MCH: 33.3 pg (ref 26.0–34.0)
MCHC: 35.5 g/dL (ref 30.0–36.0)
MCV: 93.7 fL (ref 78.0–100.0)
Monocytes Absolute: 0.5 10*3/uL (ref 0.1–1.0)
Monocytes Relative: 9 %
NEUTROS ABS: 3.5 10*3/uL (ref 1.7–7.7)
NEUTROS PCT: 64 %
Platelets: 149 10*3/uL — ABNORMAL LOW (ref 150–400)
RBC: 5.11 MIL/uL (ref 4.22–5.81)
RDW: 13.5 % (ref 11.5–15.5)
WBC: 5.4 10*3/uL (ref 4.0–10.5)

## 2016-10-23 LAB — COMPREHENSIVE METABOLIC PANEL
ALT: 28 U/L (ref 17–63)
ANION GAP: 4 — AB (ref 5–15)
AST: 30 U/L (ref 15–41)
Albumin: 4.1 g/dL (ref 3.5–5.0)
Alkaline Phosphatase: 59 U/L (ref 38–126)
BILIRUBIN TOTAL: 1.1 mg/dL (ref 0.3–1.2)
BUN: 11 mg/dL (ref 6–20)
CO2: 32 mmol/L (ref 22–32)
Calcium: 9.5 mg/dL (ref 8.9–10.3)
Chloride: 103 mmol/L (ref 101–111)
Creatinine, Ser: 1.1 mg/dL (ref 0.61–1.24)
Glucose, Bld: 98 mg/dL (ref 65–99)
POTASSIUM: 4.2 mmol/L (ref 3.5–5.1)
Sodium: 139 mmol/L (ref 135–145)
TOTAL PROTEIN: 7 g/dL (ref 6.5–8.1)

## 2016-10-23 LAB — FERRITIN: FERRITIN: 36 ng/mL (ref 24–336)

## 2016-10-23 NOTE — Progress Notes (Signed)
[facility] Progress Note  Patient Care Team: [name], MD as PCP - General (Family Medicine)  CHIEF COMPLAINTS/PURPOSE OF CONSULTATION:  Polycythemia, JAK-2 negative CALR, JAK 2 exon 12, MPL negative Asthma  HISTORY OF PRESENTING ILLNESS:  Thomas [name] 82 y.o. male is here because of JAK-2 negative polycythemia. He has never smoked but used chewing tobacco most of his life. No history of BMBX. History of sleep apnea and asthma.   He is present alone today. Patient presents for continued follow-up today. He states that he has been doing well and has no complaints. He denies any recent headaches, blurry vision. He states very active and works in his garden, takes care of his chickens in his cows. His last phlebotomy was on 09/12/16 when his hemoglobin was 18.1 g/dL, hematocrit 52%.  MEDICAL HISTORY:  Past Medical History:  Diagnosis Date  . Arthritis    "my entire back is gone"  . Asthma   . GERD (gastroesophageal reflux disease)   . H/O hiatal hernia   . Hernia   . History of stress test 2011  . Hypertension   . Hypothyroidism   . Kidney stones   . Pollen allergies   . Polycythemia   . Polycythemia, secondary 09/11/2013  . Renal calculus    some removed by Cystoscopy, one passed spontaneously  . Sleep apnea    mild no cpap    SURGICAL HISTORY: Past Surgical History:  Procedure Laterality Date  . APPENDECTOMY     50 years ago  . BACK SURGERY     L-4 , L-5  patient states that it was years ago.  Marland Kitchen COLONOSCOPY     at age 56  . COLONOSCOPY N/A 04/06/2016   Procedure: COLONOSCOPY;  Surgeon: Rogene Houston, MD;  Location: [facility];  Service: Endoscopy;  Laterality: N/A;  7:30  . EYE SURGERY     cataracts removed- bilateral- /w IOL  . INGUINAL HERNIA REPAIR Left 05/14/2016   Procedure: OPEN REPAIR OF SYMPTOMATIC (PAINFUL) LEFT INGUINAL HERNIA WITH MESH;  Surgeon: Vickie Epley, MD;  Location: [facility];  Service: General;   Laterality: Left;  . THYROIDECTOMY Right 09/17/2012   Procedure: RIGHT HEMI-THYROIDECTOMY;  Surgeon: Ascencion Dike, MD;  Location: [facility];  Service: ENT;  Laterality: Right;  . UPPER GASTROINTESTINAL ENDOSCOPY     egd/ed  . YAG LASER APPLICATION Left 51/05/5850   Procedure: YAG LASER APPLICATION;  Surgeon: Rutherford Guys, MD;  Location: [facility];  Service: Ophthalmology;  Laterality: Left;    SOCIAL HISTORY: Social History   Social History  . Marital status: Widowed    Spouse name: N/A  . Number of children: N/A  . Years of education: N/A   Occupational History  . Not on file.   Social History Main Topics  . Smoking status: Never Smoker  . Smokeless tobacco: Current User    Types: Chew     Comment: Patient states that he has been chewing for 60 plus years  . Alcohol use No     Comment: Patient states that he drinks a bout a case of beer a year  . Drug use: No  . Sexual activity: No   Other Topics Concern  . Not on file   Social History Narrative  . No narrative on file    FAMILY HISTORY: Family History  Problem Relation Age of Onset  . Heart disease Mother   . Heart disease Father   .  Heart disease Brother   . Healthy Son    indicated that his mother is deceased. He indicated that his father is deceased. He indicated that his sister is deceased. He indicated that his brother is deceased. He indicated that his son is alive. He indicated that his child is alive. He indicated that his other is alive.    ALLERGIES:  is allergic to penicillins.  MEDICATIONS:  Current Outpatient Prescriptions  Medication Sig Dispense Refill  . albuterol (PROVENTIL HFA;VENTOLIN HFA) 108 (90 BASE) MCG/ACT inhaler Inhale 2 puffs into the lungs every 4 (four) hours as needed. (Patient taking differently: Inhale 2 puffs into the lungs every 4 (four) hours as needed for wheezing or shortness of breath. ) 1 Inhaler 0  . budesonide-formoterol (SYMBICORT) 80-4.5 MCG/ACT inhaler Inhale 1 puff into the  lungs 2 (two) times daily.    . fluticasone (FLONASE) 50 MCG/ACT nasal spray INHALE 1 SPRAY INTO EACH NOSTRIL TWICE DAILY. 16 g 2  . gabapentin (NEURONTIN) 400 MG capsule Take 1 capsule (400 mg total) by mouth 3 (three) times daily. 90 capsule 12  . Hypromellose (ARTIFICIAL TEARS OP) Place 1 drop into both eyes daily as needed (dry eyes).     Marland Kitchen ibuprofen (ADVIL,MOTRIN) 200 MG tablet Take 400 mg by mouth 2 (two) times daily as needed for headache or moderate pain.    Marland Kitchen levothyroxine (SYNTHROID, LEVOTHROID) 100 MCG tablet TAKE ONE TABLET BY MOUTH DAILY BEFORE BREAKFAST. 90 tablet 1  . omeprazole (PRILOSEC) 20 MG capsule TAKE (1) CAPSULE BY MOUTH ONCE DAILY. 90 capsule 1  . pravastatin (PRAVACHOL) 40 MG tablet TAKE ONE TABLET AT BEDTIME AS DIRECTED. 90 tablet 1  . triamcinolone cream (KENALOG) 0.1 % Apply 1 application topically 2 (two) times daily. (Patient taking differently: Apply 1 application topically 2 (two) times daily as needed (rash). ) 45 g 2  . valsartan-hydrochlorothiazide (DIOVAN-HCT) 80-12.5 MG tablet TAKE ONE TABLET BY MOUTH ONCE DAILY. 90 tablet 1   No current facility-administered medications for this visit.     Review of Systems  Constitutional: Negative for chills, fever, malaise/fatigue and weight loss.  HENT: Negative for congestion, hearing loss, nosebleeds, sore throat and tinnitus.   Eyes: Negative for blurred vision, double vision, pain and discharge.  Respiratory: Negative for cough, hemoptysis, sputum production, shortness of breath and wheezing.   Cardiovascular: Negative for chest pain, palpitations, claudication, leg swelling and PND.  Gastrointestinal: Negative for abdominal pain, blood in stool, constipation, diarrhea, heartburn, melena, nausea and vomiting.  Genitourinary: Negative for dysuria, frequency, hematuria and urgency.  Musculoskeletal: Negative for falls, joint pain and myalgias.  Skin: Negative for itching and rash.  Neurological: Negative for  dizziness, tingling, tremors, sensory change, speech change, focal weakness, seizures, loss of consciousness, weakness and headaches.  Endo/Heme/Allergies: Does not bruise/bleed easily.  Psychiatric/Behavioral: Negative for depression, memory loss, substance abuse and suicidal ideas. The patient is not nervous/anxious.    14 point ROS was done and is otherwise as detailed above or in HPI   PHYSICAL EXAMINATION: ECOG PERFORMANCE STATUS: 0 - Asymptomatic  Blood pressure 126/70, pulse 52, respiratory rate 16, O2 sat 100%  Physical Exam  Constitutional: He is oriented to person, place, and time and well-developed, well-nourished, and in no distress.  Appears younger than stated age  HENT:  Head: Normocephalic and atraumatic.  Nose: Nose normal.  Mouth/Throat: Oropharynx is clear and moist. No oropharyngeal exudate.  Eyes: Conjunctivae and EOM are normal. Pupils are equal, round, and reactive to light. Right eye  exhibits no discharge. Left eye exhibits no discharge. No scleral icterus.  Neck: Normal range of motion. Neck supple. No tracheal deviation present. No thyromegaly present.  Cardiovascular: Normal rate, regular rhythm and normal heart sounds.  Exam reveals no gallop and no friction rub.   No murmur heard. Pulmonary/Chest: Effort normal and breath sounds normal. He has no wheezes. He has no rales.  Abdominal: Soft. Bowel sounds are normal. He exhibits no distension and no mass. There is no tenderness. There is no rebound and no guarding.  Musculoskeletal: Normal range of motion. He exhibits no edema.  Lymphadenopathy:    He has no cervical adenopathy.  Neurological: He is alert and oriented to person, place, and time. He has normal reflexes. No cranial nerve deficit. Gait normal. Coordination normal.  Skin: Skin is warm and dry. No rash noted.  Psychiatric: Mood, memory, affect and judgment normal.  Nursing note and vitals reviewed.   LABORATORY DATA:  I have reviewed the data  as listed Lab Results  Component Value Date   WBC 5.4 10/23/2016   HGB 17.0 10/23/2016   HCT 47.9 10/23/2016   MCV 93.7 10/23/2016   PLT 149 (L) 10/23/2016      RADIOLOGY: I have reviewed the images below and agree with the reported results Study Result   CLINICAL DATA:  Left lower quadrant pain.  Nausea and vomiting.  EXAM: CT ABDOMEN AND PELVIS WITH CONTRAST  TECHNIQUE: Multidetector CT imaging of the abdomen and pelvis was performed using the standard protocol following bolus administration of intravenous contrast.  CONTRAST:  161mL ISOVUE-300 IOPAMIDOL (ISOVUE-300) INJECTION 61%  COMPARISON:  CT abdomen pelvis 04/07/2012  FINDINGS: Lower chest: No pulmonary nodules. No visible pleural or pericardial effusion.  Hepatobiliary: Normal hepatic size and contours without focal liver lesion. No perihepatic ascites. No intra- or extrahepatic biliary dilatation. Normal gallbladder.  Pancreas: Normal pancreatic contours and enhancement. No peripancreatic fluid collection or pancreatic ductal dilatation.  Spleen: Normal.  Adrenals/Urinary Tract: Normal adrenal glands. No hydronephrosis or solid renal mass.  Stomach/Bowel: There is rectosigmoid diverticulosis. There is a focal, asymmetric area of irregular wall thickening of the mid sigmoid colon (series 2, image 71). There is mild surrounding fat stranding. No dilated loops of small bowel. The appendix is surgically absent. No abdominal fluid collection.  Vascular/Lymphatic: Normal course and caliber of the major abdominal vessels. There is mild atherosclerosis of the non aneurysmal aorta.  Reproductive: No abdominal or pelvic lymphadenopathy.  Musculoskeletal: The prostate is enlarged. Seminal vesicles are prominent. Normal visualized extrathoracic and extraperitoneal soft tissues.  Other: No contributory non-categorized findings.  IMPRESSION: 1. Focal area of wall thickening within the mid  sigmoid colon with associated mild inflammatory stranding in the adjacent fat. This may indicate acute diverticulitis. However, given the somewhat masslike appearance of the wall thickening, follow-up with colonoscopy is recommended once the acute symptoms have resolved to exclude the possibility of a colonic neoplasm. 2. Enlarged prostate. 3. Aortic atherosclerosis.   Electronically Signed   By: Ulyses Jarred M.D.   On: 02/13/2016 16:09      ASSESSMENT & PLAN:  Polycythemia, secondary JAK-2 V617 F mutation negative,JAK 2 EXON 12 mutation negative, MPL mutation negative, CALR mutation negative Asthma No history of cigarette use Thrombocytopenia  Clinically asymptomatic.  Reviewed patient's labs today. We'll plan to set him up for labs every 3 months and phlebotomies if his hematocrit is above 50%. Return to clinic in 6 months for follow-up with labs.

## 2016-10-24 ENCOUNTER — Other Ambulatory Visit (HOSPITAL_COMMUNITY): Payer: Self-pay | Admitting: *Deleted

## 2016-11-06 DIAGNOSIS — J309 Allergic rhinitis, unspecified: Secondary | ICD-10-CM | POA: Diagnosis not present

## 2016-11-20 DIAGNOSIS — J309 Allergic rhinitis, unspecified: Secondary | ICD-10-CM | POA: Diagnosis not present

## 2016-11-22 ENCOUNTER — Other Ambulatory Visit: Payer: Self-pay | Admitting: *Deleted

## 2016-11-22 MED ORDER — FLUTICASONE PROPIONATE 50 MCG/ACT NA SUSP: NASAL | 5 refills | Status: DC

## 2016-12-03 DIAGNOSIS — K219 Gastro-esophageal reflux disease without esophagitis: Secondary | ICD-10-CM | POA: Diagnosis not present

## 2016-12-03 DIAGNOSIS — J4540 Moderate persistent asthma, uncomplicated: Secondary | ICD-10-CM | POA: Diagnosis not present

## 2016-12-03 DIAGNOSIS — J3089 Other allergic rhinitis: Secondary | ICD-10-CM | POA: Diagnosis not present

## 2016-12-18 DIAGNOSIS — J309 Allergic rhinitis, unspecified: Secondary | ICD-10-CM | POA: Diagnosis not present

## 2016-12-25 DIAGNOSIS — J309 Allergic rhinitis, unspecified: Secondary | ICD-10-CM | POA: Diagnosis not present

## 2016-12-31 ENCOUNTER — Ambulatory Visit (INDEPENDENT_AMBULATORY_CARE_PROVIDER_SITE_OTHER): Payer: Medicare Other | Admitting: Otolaryngology

## 2016-12-31 DIAGNOSIS — J320 Chronic maxillary sinusitis: Secondary | ICD-10-CM | POA: Diagnosis not present

## 2016-12-31 DIAGNOSIS — J310 Chronic rhinitis: Secondary | ICD-10-CM | POA: Diagnosis not present

## 2017-01-01 DIAGNOSIS — Z23 Encounter for immunization: Secondary | ICD-10-CM | POA: Diagnosis not present

## 2017-01-01 DIAGNOSIS — J309 Allergic rhinitis, unspecified: Secondary | ICD-10-CM | POA: Diagnosis not present

## 2017-01-04 DIAGNOSIS — J301 Allergic rhinitis due to pollen: Secondary | ICD-10-CM | POA: Diagnosis not present

## 2017-01-04 DIAGNOSIS — J3089 Other allergic rhinitis: Secondary | ICD-10-CM | POA: Diagnosis not present

## 2017-01-15 ENCOUNTER — Other Ambulatory Visit: Payer: Self-pay | Admitting: Family Medicine

## 2017-01-22 ENCOUNTER — Encounter (HOSPITAL_COMMUNITY): Payer: Medicare Other | Attending: Oncology

## 2017-01-22 DIAGNOSIS — D45 Polycythemia vera: Secondary | ICD-10-CM | POA: Insufficient documentation

## 2017-01-22 DIAGNOSIS — D751 Secondary polycythemia: Secondary | ICD-10-CM

## 2017-01-22 LAB — CBC WITH DIFFERENTIAL/PLATELET
BASOS ABS: 0 10*3/uL (ref 0.0–0.1)
BASOS PCT: 0 %
EOS PCT: 3 %
Eosinophils Absolute: 0.1 10*3/uL (ref 0.0–0.7)
HEMATOCRIT: 49.9 % (ref 39.0–52.0)
Hemoglobin: 17.4 g/dL — ABNORMAL HIGH (ref 13.0–17.0)
LYMPHS PCT: 24 %
Lymphs Abs: 1.3 10*3/uL (ref 0.7–4.0)
MCH: 33.1 pg (ref 26.0–34.0)
MCHC: 34.9 g/dL (ref 30.0–36.0)
MCV: 95 fL (ref 78.0–100.0)
Monocytes Absolute: 0.5 10*3/uL (ref 0.1–1.0)
Monocytes Relative: 8 %
NEUTROS ABS: 3.7 10*3/uL (ref 1.7–7.7)
Neutrophils Relative %: 65 %
PLATELETS: 155 10*3/uL (ref 150–400)
RBC: 5.25 MIL/uL (ref 4.22–5.81)
RDW: 13.4 % (ref 11.5–15.5)
WBC: 5.7 10*3/uL (ref 4.0–10.5)

## 2017-01-23 ENCOUNTER — Other Ambulatory Visit (HOSPITAL_COMMUNITY): Payer: Medicare Other

## 2017-01-29 DIAGNOSIS — J309 Allergic rhinitis, unspecified: Secondary | ICD-10-CM | POA: Diagnosis not present

## 2017-02-21 DIAGNOSIS — J309 Allergic rhinitis, unspecified: Secondary | ICD-10-CM | POA: Diagnosis not present

## 2017-02-26 DIAGNOSIS — J309 Allergic rhinitis, unspecified: Secondary | ICD-10-CM | POA: Diagnosis not present

## 2017-03-12 DIAGNOSIS — J309 Allergic rhinitis, unspecified: Secondary | ICD-10-CM | POA: Diagnosis not present

## 2017-03-14 ENCOUNTER — Ambulatory Visit: Payer: Medicare Other | Admitting: Family Medicine

## 2017-03-14 ENCOUNTER — Encounter: Payer: Self-pay | Admitting: Family Medicine

## 2017-03-14 VITALS — BP 128/80 | Ht 68.0 in | Wt 185.0 lb

## 2017-03-14 DIAGNOSIS — E038 Other specified hypothyroidism: Secondary | ICD-10-CM | POA: Diagnosis not present

## 2017-03-14 DIAGNOSIS — I10 Essential (primary) hypertension: Secondary | ICD-10-CM

## 2017-03-14 DIAGNOSIS — R1032 Left lower quadrant pain: Secondary | ICD-10-CM | POA: Diagnosis not present

## 2017-03-14 DIAGNOSIS — E7849 Other hyperlipidemia: Secondary | ICD-10-CM | POA: Diagnosis not present

## 2017-03-14 MED ORDER — LEVOTHYROXINE SODIUM 100 MCG PO TABS: ORAL_TABLET | ORAL | 1 refills | Status: DC

## 2017-03-14 MED ORDER — ALBUTEROL SULFATE HFA 108 (90 BASE) MCG/ACT IN AERS: 2.0000 | INHALATION_SPRAY | RESPIRATORY_TRACT | 5 refills | Status: DC | PRN

## 2017-03-14 MED ORDER — VALSARTAN-HYDROCHLOROTHIAZIDE 80-12.5 MG PO TABS: ORAL_TABLET | ORAL | 1 refills | Status: DC

## 2017-03-14 NOTE — Progress Notes (Addendum)
   Subjective:    Patient ID: Thomas [name], male    DOB: 08/16/1934, 82 y.o.   MRN: [id number]  Hypertension  This is a chronic problem. The current episode started more than 1 year ago. Pertinent negatives include no chest pain, headaches or shortness of breath. Risk factors for coronary artery disease include dyslipidemia and male gender. Treatments tried: valsartin/hctz. There are no compliance problems.    Some lower abd off and on pain Denies any severe abdominal pain no vomiting with it no bloody stools no mucousy stools has had some history of diverticulitis. Patient had a colonoscopy this past December Patient had abnormal CAT scan about a year ago thickened colon which was negative on colonoscopy is set for diverticula Patient denies any fever chills sweats  Takes his blood pressure medicine regular basis denies any problems Uses his long-acting Symbicort on a regular basis no flareups of asthma Needs a prescription for albuterol sent in Takes his thyroid medicine without fail Energy level doing okay Takes his cholesterol medicine watch his diet Does have polycythemia followed by oncology they will be doing additional blood work in January   Review of Systems  Constitutional: Negative for activity change, fatigue and fever.  Respiratory: Negative for cough and shortness of breath.   Cardiovascular: Negative for chest pain and leg swelling.  Neurological: Negative for headaches.       Objective:   Physical Exam  Constitutional: He appears well-nourished. No distress.  Cardiovascular: Normal rate, regular rhythm and normal heart sounds.  No murmur heard. Pulmonary/Chest: Effort normal and breath sounds normal. No respiratory distress.  Abdominal: Soft. He exhibits no mass. There is no tenderness. There is no rebound and no guarding.  Musculoskeletal: He exhibits no edema.  Lymphadenopathy:    He has no cervical adenopathy.  Neurological: He is alert.  Psychiatric:  His behavior is normal.  Vitals reviewed.    On abdominal exam there is some slight tenderness in the lower abdomen but no guarding rebound or significant discomfort no mass felt     Assessment & Plan:  Hypothyroidism continue current medication check lab work previous labs reviewed  Blood pressure good control continue current medication check metabolic 7 previous labs reviewed  Hyperlipidemia continue current medication previous labs reviewed new cholesterol profile ordered  Asthma stable refills of albuterol was sent in  Patient with left lower quadrant abdominal discomfort should gradually get better over the next week or 2 I believe it was related to what he ate at the [employer] patient was instructed if this gets worse or more intense or more painful he needs to call us for treatment of possible diverticulitis hold off on any scans or lab work right now  Follow-up polycythemia with hematology oncology

## 2017-03-26 DIAGNOSIS — J309 Allergic rhinitis, unspecified: Secondary | ICD-10-CM | POA: Diagnosis not present

## 2017-04-10 ENCOUNTER — Other Ambulatory Visit: Payer: Self-pay | Admitting: Family Medicine

## 2017-04-11 DIAGNOSIS — J309 Allergic rhinitis, unspecified: Secondary | ICD-10-CM | POA: Diagnosis not present

## 2017-04-23 DIAGNOSIS — J309 Allergic rhinitis, unspecified: Secondary | ICD-10-CM | POA: Diagnosis not present

## 2017-04-24 ENCOUNTER — Other Ambulatory Visit (HOSPITAL_COMMUNITY): Payer: Self-pay | Admitting: *Deleted

## 2017-04-24 DIAGNOSIS — D751 Secondary polycythemia: Secondary | ICD-10-CM

## 2017-04-24 DIAGNOSIS — D45 Polycythemia vera: Secondary | ICD-10-CM

## 2017-04-25 ENCOUNTER — Inpatient Hospital Stay (HOSPITAL_COMMUNITY): Payer: Medicare Other | Attending: Oncology

## 2017-04-25 ENCOUNTER — Other Ambulatory Visit: Payer: Self-pay

## 2017-04-25 ENCOUNTER — Inpatient Hospital Stay (HOSPITAL_COMMUNITY): Payer: Medicare Other | Admitting: Adult Health

## 2017-04-25 ENCOUNTER — Encounter: Payer: Self-pay | Admitting: Family Medicine

## 2017-04-25 ENCOUNTER — Other Ambulatory Visit (HOSPITAL_COMMUNITY)
Admission: RE | Admit: 2017-04-25 | Discharge: 2017-04-25 | Disposition: A | Discharge status: Home / Self Care | Payer: Medicare Other | Source: Ambulatory Visit | Attending: Family Medicine | Admitting: Family Medicine

## 2017-04-25 ENCOUNTER — Encounter (HOSPITAL_COMMUNITY): Payer: Self-pay | Admitting: Adult Health

## 2017-04-25 VITALS — BP 146/67 | HR 49 | Temp 97.6°F | Resp 18 | Ht 68.0 in | Wt 180.0 lb

## 2017-04-25 DIAGNOSIS — F17220 Nicotine dependence, chewing tobacco, uncomplicated: Secondary | ICD-10-CM

## 2017-04-25 DIAGNOSIS — M129 Arthropathy, unspecified: Secondary | ICD-10-CM

## 2017-04-25 DIAGNOSIS — Z79899 Other long term (current) drug therapy: Secondary | ICD-10-CM | POA: Insufficient documentation

## 2017-04-25 DIAGNOSIS — J45909 Unspecified asthma, uncomplicated: Secondary | ICD-10-CM | POA: Insufficient documentation

## 2017-04-25 DIAGNOSIS — K219 Gastro-esophageal reflux disease without esophagitis: Secondary | ICD-10-CM | POA: Diagnosis not present

## 2017-04-25 DIAGNOSIS — I10 Essential (primary) hypertension: Secondary | ICD-10-CM | POA: Insufficient documentation

## 2017-04-25 DIAGNOSIS — K449 Diaphragmatic hernia without obstruction or gangrene: Secondary | ICD-10-CM | POA: Insufficient documentation

## 2017-04-25 DIAGNOSIS — D751 Secondary polycythemia: Secondary | ICD-10-CM | POA: Diagnosis not present

## 2017-04-25 DIAGNOSIS — G4730 Sleep apnea, unspecified: Secondary | ICD-10-CM | POA: Diagnosis not present

## 2017-04-25 DIAGNOSIS — E038 Other specified hypothyroidism: Secondary | ICD-10-CM | POA: Diagnosis not present

## 2017-04-25 DIAGNOSIS — Z87442 Personal history of urinary calculi: Secondary | ICD-10-CM | POA: Diagnosis not present

## 2017-04-25 DIAGNOSIS — E7849 Other hyperlipidemia: Secondary | ICD-10-CM | POA: Insufficient documentation

## 2017-04-25 DIAGNOSIS — Z9049 Acquired absence of other specified parts of digestive tract: Secondary | ICD-10-CM | POA: Insufficient documentation

## 2017-04-25 DIAGNOSIS — G629 Polyneuropathy, unspecified: Secondary | ICD-10-CM | POA: Insufficient documentation

## 2017-04-25 LAB — COMPREHENSIVE METABOLIC PANEL
ALT: 47 U/L (ref 17–63)
ANION GAP: 9 (ref 5–15)
AST: 36 U/L (ref 15–41)
Albumin: 4.2 g/dL (ref 3.5–5.0)
Alkaline Phosphatase: 68 U/L (ref 38–126)
BUN: 12 mg/dL (ref 6–20)
CHLORIDE: 99 mmol/L — AB (ref 101–111)
CO2: 29 mmol/L (ref 22–32)
CREATININE: 1.01 mg/dL (ref 0.61–1.24)
Calcium: 9.5 mg/dL (ref 8.9–10.3)
Glucose, Bld: 90 mg/dL (ref 65–99)
POTASSIUM: 3.8 mmol/L (ref 3.5–5.1)
Sodium: 137 mmol/L (ref 135–145)
Total Bilirubin: 0.9 mg/dL (ref 0.3–1.2)
Total Protein: 7.1 g/dL (ref 6.5–8.1)

## 2017-04-25 LAB — CBC WITH DIFFERENTIAL/PLATELET
Basophils Absolute: 0 10*3/uL (ref 0.0–0.1)
Basophils Relative: 0 %
EOS PCT: 4 %
Eosinophils Absolute: 0.2 10*3/uL (ref 0.0–0.7)
HCT: 50.5 % (ref 39.0–52.0)
Hemoglobin: 17.6 g/dL — ABNORMAL HIGH (ref 13.0–17.0)
LYMPHS ABS: 1.3 10*3/uL (ref 0.7–4.0)
LYMPHS PCT: 23 %
MCH: 33.1 pg (ref 26.0–34.0)
MCHC: 34.9 g/dL (ref 30.0–36.0)
MCV: 95.1 fL (ref 78.0–100.0)
MONO ABS: 0.5 10*3/uL (ref 0.1–1.0)
MONOS PCT: 8 %
Neutro Abs: 3.7 10*3/uL (ref 1.7–7.7)
Neutrophils Relative %: 65 %
PLATELETS: 154 10*3/uL (ref 150–400)
RBC: 5.31 MIL/uL (ref 4.22–5.81)
RDW: 13.3 % (ref 11.5–15.5)
WBC: 5.7 10*3/uL (ref 4.0–10.5)

## 2017-04-25 LAB — LIPID PANEL
CHOLESTEROL: 137 mg/dL (ref 0–200)
HDL: 37 mg/dL — AB (ref >40)
LDL Cholesterol: 65 mg/dL (ref 0–99)
Total CHOL/HDL Ratio: 3.7 RATIO
Triglycerides: 177 mg/dL — ABNORMAL HIGH (ref <150)
VLDL: 35 mg/dL (ref 0–40)

## 2017-04-25 LAB — TSH: TSH: 1.085 u[IU]/mL (ref 0.350–4.500)

## 2017-04-25 LAB — FERRITIN: FERRITIN: 43 ng/mL (ref 24–336)

## 2017-04-25 NOTE — Progress Notes (Signed)
[place], [place] [address]   CLINIC:  Medical Oncology/Hematology  PCP:  [name], MD [address] [place] [address] [phone]   REASON FOR VISIT:  Follow-up for Secondary polycythemia   CURRENT THERAPY: Therapeutic phlebotomy as needed to maintain Hct <50%.     HISTORY OF PRESENT ILLNESS:  (From Dr. Laverle Patter note on 10/23/16)        INTERVAL HISTORY:  Thomas [name] 83 y.o. male returns for follow-up for secondary polycythemia.    Overall, he tells me he has been feeling very well. Appetite 100%; energy levels 100%. Denies any chest pain, shortness of breath, palpitations, or leg swelling.  Denies any bleeding episodes.  He does not take aspirin.  He has some chronic peripheral neuropathy to his fingertips, which has been chronic and is largely unchanged.   He remains very active caring for his chickens and his garden. 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    He wants to review his lab work together today and receive a paper copy for his labs.   Otherwise, he is largely without complaints today.     REVIEW OF SYSTEMS:  Review of Systems  Constitutional: Negative.  Negative for chills, fatigue and fever.  HENT:  Negative.  Negative for lump/mass and nosebleeds.   Eyes: Negative.   Respiratory: Negative.  Negative for cough and shortness of breath.   Cardiovascular: Negative.  Negative for chest pain and leg swelling.  Gastrointestinal: Negative.  Negative for abdominal pain, blood in stool, constipation, diarrhea, nausea and vomiting.  Endocrine: Negative.   Genitourinary: Negative.  Negative for dysuria and hematuria.   Musculoskeletal: Negative.  Negative for arthralgias.  Skin: Negative.  Negative for rash.  Neurological: Positive for numbness. Negative for dizziness and headaches.  Hematological: Negative.  Negative for adenopathy. Does not bruise/bleed easily.  Psychiatric/Behavioral:  Negative.  Negative for depression and sleep disturbance. The patient is not nervous/anxious.      PAST MEDICAL/SURGICAL HISTORY:  Past Medical History:  Diagnosis Date  . Arthritis    "my entire back is gone"  . Asthma   . GERD (gastroesophageal reflux disease)   . H/O hiatal hernia   . Hernia   . History of stress test 2011  . Hypertension   . Hypothyroidism   . Kidney stones   . Pollen allergies   . Polycythemia   . Polycythemia, secondary 09/11/2013  . Renal calculus    some removed by Cystoscopy, one passed spontaneously  . Sleep apnea    mild no cpap   Past Surgical History:  Procedure Laterality Date  . APPENDECTOMY     50 years ago  . BACK SURGERY     L-4 , L-5  patient states that it was years ago.  Marland Kitchen COLONOSCOPY     at age 48  . COLONOSCOPY N/A 04/06/2016   Procedure: COLONOSCOPY;  Surgeon: Rogene Houston, MD;  Location: [facility];  Service: Endoscopy;  Laterality: N/A;  7:30  . EYE SURGERY     cataracts removed- bilateral- /w IOL  . INGUINAL HERNIA REPAIR Left 05/14/2016   Procedure: OPEN REPAIR OF SYMPTOMATIC (PAINFUL) LEFT INGUINAL HERNIA WITH MESH;  Surgeon: Vickie Epley, MD;  Location: [facility];  Service: General;  Laterality: Left;  . THYROIDECTOMY Right 09/17/2012   Procedure: RIGHT HEMI-THYROIDECTOMY;  Surgeon: Ascencion Dike, MD;  Location: [facility];  Service: ENT;  Laterality: Right;  .  UPPER GASTROINTESTINAL ENDOSCOPY     egd/ed  . YAG LASER APPLICATION Left 09/32/6712   Procedure: YAG LASER APPLICATION;  Surgeon: Rutherford Guys, MD;  Location: [facility];  Service: Ophthalmology;  Laterality: Left;     SOCIAL HISTORY:  Social History   Socioeconomic History  . Marital status: Widowed    Spouse name: Not on file  . Number of children: Not on file  . Years of education: Not on file  . Highest education level: Not on file  Social Needs  . Financial resource strain: Not on file  . Food insecurity - worry: Not on file  . Food insecurity - inability:  Not on file  . Transportation needs - medical: Not on file  . Transportation needs - non-medical: Not on file  Occupational History  . Not on file  Tobacco Use  . Smoking status: Never Smoker  . Smokeless tobacco: Current User    Types: Chew  . Tobacco comment: Patient states that he has been chewing for 60 plus years  Substance and Sexual Activity  . Alcohol use: No    Comment: Patient states that he drinks a bout a case of beer a year  . Drug use: No  . Sexual activity: No  Other Topics Concern  . Not on file  Social History Narrative  . Not on file    FAMILY HISTORY:  Family History  Problem Relation Age of Onset  . Heart disease Mother   . Heart disease Father   . Heart disease Brother   . Healthy Son     CURRENT MEDICATIONS:  Outpatient Encounter Medications as of 04/25/2017  Medication Sig  . albuterol (PROVENTIL HFA;VENTOLIN HFA) 108 (90 Base) MCG/ACT inhaler Inhale 2 puffs into the lungs every 4 (four) hours as needed.  . budesonide-formoterol (SYMBICORT) 80-4.5 MCG/ACT inhaler Inhale 1 puff into the lungs 2 (two) times daily.  . fluticasone (FLONASE) 50 MCG/ACT nasal spray INHALE 1 SPRAY INTO EACH NOSTRIL TWICE DAILY.  Marland Kitchen gabapentin (NEURONTIN) 400 MG capsule Take 1 capsule (400 mg total) by mouth 3 (three) times daily.  . Hypromellose (ARTIFICIAL TEARS OP) Place 1 drop into both eyes daily as needed (dry eyes).   Marland Kitchen ibuprofen (ADVIL,MOTRIN) 200 MG tablet Take 400 mg by mouth 2 (two) times daily as needed for headache or moderate pain.  Marland Kitchen levothyroxine (SYNTHROID, LEVOTHROID) 100 MCG tablet TAKE ONE TABLET BY MOUTH DAILY BEFORE BREAKFAST.  Marland Kitchen omeprazole (PRILOSEC) 20 MG capsule TAKE (1) CAPSULE BY MOUTH ONCE DAILY.  . pravastatin (PRAVACHOL) 40 MG tablet TAKE ONE TABLET AT BEDTIME AS DIRECTED.  Marland Kitchen triamcinolone cream (KENALOG) 0.1 % Apply 1 application topically 2 (two) times daily. (Patient taking differently: Apply 1 application topically 2 (two) times daily as needed  (rash). )  . triamcinolone cream (KENALOG) 0.1 % APPLY TO AFFECTED AREAS TWICE DAILY.  . valsartan-hydrochlorothiazide (DIOVAN-HCT) 80-12.5 MG tablet TAKE (1) TABLET BY MOUTH ONCE DAILY.   No facility-administered encounter medications on file as of 04/25/2017.     ALLERGIES:  Allergies  Allergen Reactions  . Penicillins Other (See Comments)    Reaction many years ago-unknown reaction Has patient had a PCN reaction causing immediate rash, facial/tongue/throat swelling, SOB or lightheadedness with hypotension: Unknown Has patient had a PCN reaction causing severe rash involving mucus membranes or skin necrosis: Unknown Has patient had a PCN reaction that required hospitalization Unknown Has patient had a PCN reaction occurring within the last 10 years: No If all of the above answers are "  NO", then may proceed with Cephalosporin use.      PHYSICAL EXAM:  ECOG Performance status: 0 - Asymptomatic   Vitals:   04/25/17 0943  BP: (!) 146/67  Pulse: (!) 49  Resp: 18  Temp: 97.6 F (36.4 C)  SpO2: 99%   Filed Weights   04/25/17 0943  Weight: 180 lb (81.6 kg)    Physical Exam  Constitutional: He is oriented to person, place, and time and well-developed, well-nourished, and in no distress.  HENT:  Head: Normocephalic.  Mouth/Throat: Oropharynx is clear and moist. No oropharyngeal exudate.  Eyes: Conjunctivae are normal. Pupils are equal, round, and reactive to light. No scleral icterus.  Neck: Normal range of motion. Neck supple.  Cardiovascular: Normal rate and regular rhythm.  Bradycardia  Pulmonary/Chest: Effort normal and breath sounds normal. No respiratory distress.  Abdominal: Soft. Bowel sounds are normal. There is no tenderness.  Musculoskeletal: Normal range of motion. He exhibits no edema.  Lymphadenopathy:    He has no cervical adenopathy.       Right: No supraclavicular adenopathy present.       Left: No supraclavicular adenopathy present.  Neurological: He is  alert and oriented to person, place, and time. No cranial nerve deficit. Gait normal.  Skin: Skin is warm and dry. No rash noted.  Psychiatric: Mood, memory, affect and judgment normal.  Nursing note and vitals reviewed.    LABORATORY DATA:  I have reviewed the labs as listed.  CBC    Component Value Date/Time   WBC 5.7 04/25/2017 0858   RBC 5.31 04/25/2017 0858   HGB 17.6 (H) 04/25/2017 0858   HGB 18.1 (H) 09/12/2016 0816   HCT 50.5 04/25/2017 0858   HCT 52.0 (H) 09/12/2016 0816   PLT 154 04/25/2017 0858   PLT 172 09/12/2016 0816   MCV 95.1 04/25/2017 0858   MCV 93 09/12/2016 0816   MCH 33.1 04/25/2017 0858   MCHC 34.9 04/25/2017 0858   RDW 13.3 04/25/2017 0858   RDW 14.1 09/12/2016 0816   LYMPHSABS 1.3 04/25/2017 0858   LYMPHSABS 1.3 09/12/2016 0816   MONOABS 0.5 04/25/2017 0858   EOSABS 0.2 04/25/2017 0858   EOSABS 0.2 09/12/2016 0816   BASOSABS 0.0 04/25/2017 0858   BASOSABS 0.0 09/12/2016 0816   CMP Latest Ref Rng & Units 04/25/2017 10/23/2016 09/12/2016  Glucose 65 - 99 mg/dL 90 98 106(H)  BUN 6 - 20 mg/dL '12 11 11  ' Creatinine 0.61 - 1.24 mg/dL 1.01 1.10 1.13  Sodium 135 - 145 mmol/L 137 139 143  Potassium 3.5 - 5.1 mmol/L 3.8 4.2 4.3  Chloride 101 - 111 mmol/L 99(L) 103 102  CO2 22 - 32 mmol/L 29 32 24  Calcium 8.9 - 10.3 mg/dL 9.5 9.5 9.5  Total Protein 6.5 - 8.1 g/dL 7.1 7.0 -  Total Bilirubin 0.3 - 1.2 mg/dL 0.9 1.1 -  Alkaline Phos 38 - 126 U/L 68 59 -  AST 15 - 41 U/L 36 30 -  ALT 17 - 63 U/L 47 28 -         PENDING LABS:    DIAGNOSTIC IMAGING:    PATHOLOGY:       ASSESSMENT & PLAN:   Secondary polycythemia:  -CALR/JAK2/MPL negative. Thought to possibly be secondary to sleep apnea. No h/o bone marrow biopsy.   -Last phlebotomy was done on 09/24/16 based on our records. Prior to that, therapeutic phlebotomy was done on 05/02/16.  Generally requires phlebotomy approx every 5-6 months.  -Available labs  reviewed today in detail with patient;  he was also provided a paper copy as well per his request.  Hct 50.5% today. Will make arrangements for 1 therapeutic phlebotomy (500 mL) sometime within the next week or so.  He is not taking a daily aspirin; we may need to consider starting in the future if his need for therapeutic phlebotomy increases over time.   -Clinically, he remains symptomatic. Reviewed the importance of drinking plenty of fluids and eating prior to therapeutic phlebotomy to reduce possible side effects (reports that he once went to have phlebotomy performed and got dizzy/weak because he had not eaten prior to procedure).  -Labs only every 3 months.  -Return to cancer center in 6 months for follow-up with labs.       Dispo:  -Labs only every 3 months (CBC with diff) -Return to cancer center in 6 months for follow-up with labs (CBC with diff & CMET).    All questions were answered to patient's stated satisfaction. Encouraged patient to call with any new concerns or questions before his next visit to the cancer center and we can certain see him sooner, if needed.      Orders placed this encounter:  Orders Placed This Encounter  Procedures  . Phlebotomy therapeutic  . CBC with Differential/Platelet  . Comprehensive metabolic panel      Mike Craze, NP [place] [phone]

## 2017-05-01 ENCOUNTER — Encounter (HOSPITAL_COMMUNITY)
Admission: RE | Admit: 2017-05-01 | Discharge: 2017-05-01 | Disposition: A | Discharge status: Home / Self Care | Payer: Medicare Other | Source: Ambulatory Visit | Attending: Oncology | Admitting: Oncology

## 2017-05-01 DIAGNOSIS — D751 Secondary polycythemia: Secondary | ICD-10-CM | POA: Diagnosis not present

## 2017-05-01 NOTE — Progress Notes (Signed)
[name] presents today for phlebotomy per MD orders. HGB/HCT:17.6/50.5 Phlebotomy procedure started at 0918 and ended at 0928. 330 cc removed. Patient tolerated procedure well. IV needle removed intact from R AC.

## 2017-05-07 DIAGNOSIS — J309 Allergic rhinitis, unspecified: Secondary | ICD-10-CM | POA: Diagnosis not present

## 2017-05-21 DIAGNOSIS — J309 Allergic rhinitis, unspecified: Secondary | ICD-10-CM | POA: Diagnosis not present

## 2017-06-04 DIAGNOSIS — J309 Allergic rhinitis, unspecified: Secondary | ICD-10-CM | POA: Diagnosis not present

## 2017-06-12 ENCOUNTER — Other Ambulatory Visit: Payer: Self-pay | Admitting: Family Medicine

## 2017-07-02 DIAGNOSIS — J309 Allergic rhinitis, unspecified: Secondary | ICD-10-CM | POA: Diagnosis not present

## 2017-07-06 ENCOUNTER — Other Ambulatory Visit: Payer: Self-pay | Admitting: Family Medicine

## 2017-07-16 DIAGNOSIS — J309 Allergic rhinitis, unspecified: Secondary | ICD-10-CM | POA: Diagnosis not present

## 2017-07-24 ENCOUNTER — Inpatient Hospital Stay (HOSPITAL_COMMUNITY): Payer: Medicare Other | Attending: Internal Medicine

## 2017-07-24 DIAGNOSIS — D751 Secondary polycythemia: Secondary | ICD-10-CM | POA: Diagnosis not present

## 2017-07-24 LAB — COMPREHENSIVE METABOLIC PANEL
ALBUMIN: 4.1 g/dL (ref 3.5–5.0)
ALT: 29 U/L (ref 17–63)
ANION GAP: 10 (ref 5–15)
AST: 30 U/L (ref 15–41)
Alkaline Phosphatase: 64 U/L (ref 38–126)
BILIRUBIN TOTAL: 1.1 mg/dL (ref 0.3–1.2)
BUN: 12 mg/dL (ref 6–20)
CHLORIDE: 102 mmol/L (ref 101–111)
CO2: 27 mmol/L (ref 22–32)
Calcium: 9.3 mg/dL (ref 8.9–10.3)
Creatinine, Ser: 0.97 mg/dL (ref 0.61–1.24)
GFR calc Af Amer: >60 mL/min (ref >60)
GFR calc non Af Amer: >60 mL/min (ref >60)
GLUCOSE: 89 mg/dL (ref 65–99)
POTASSIUM: 3.7 mmol/L (ref 3.5–5.1)
SODIUM: 139 mmol/L (ref 135–145)
TOTAL PROTEIN: 6.9 g/dL (ref 6.5–8.1)

## 2017-07-24 LAB — CBC WITH DIFFERENTIAL/PLATELET
BASOS ABS: 0 10*3/uL (ref 0.0–0.1)
BASOS PCT: 0 %
EOS ABS: 0.2 10*3/uL (ref 0.0–0.7)
EOS PCT: 3 %
HCT: 48.6 % (ref 39.0–52.0)
Hemoglobin: 17 g/dL (ref 13.0–17.0)
Lymphocytes Relative: 19 %
Lymphs Abs: 1.2 10*3/uL (ref 0.7–4.0)
MCH: 32.2 pg (ref 26.0–34.0)
MCHC: 35 g/dL (ref 30.0–36.0)
MCV: 92 fL (ref 78.0–100.0)
MONO ABS: 0.5 10*3/uL (ref 0.1–1.0)
MONOS PCT: 9 %
NEUTROS ABS: 4 10*3/uL (ref 1.7–7.7)
Neutrophils Relative %: 69 %
PLATELETS: 152 10*3/uL (ref 150–400)
RBC: 5.28 MIL/uL (ref 4.22–5.81)
RDW: 13.3 % (ref 11.5–15.5)
WBC: 5.9 10*3/uL (ref 4.0–10.5)

## 2017-08-01 DIAGNOSIS — J309 Allergic rhinitis, unspecified: Secondary | ICD-10-CM | POA: Diagnosis not present

## 2017-08-05 ENCOUNTER — Other Ambulatory Visit: Payer: Self-pay | Admitting: Family Medicine

## 2017-08-05 ENCOUNTER — Ambulatory Visit (INDEPENDENT_AMBULATORY_CARE_PROVIDER_SITE_OTHER): Payer: Medicare Other | Admitting: Family Medicine

## 2017-08-05 VITALS — BP 122/82 | Temp 99.7°F | Ht 68.0 in | Wt 176.0 lb

## 2017-08-05 DIAGNOSIS — J329 Chronic sinusitis, unspecified: Secondary | ICD-10-CM | POA: Diagnosis not present

## 2017-08-05 MED ORDER — CLARITHROMYCIN 500 MG PO TABS: 500.0000 mg | ORAL_TABLET | Freq: Two times a day (BID) | ORAL | 0 refills | Status: DC

## 2017-08-05 NOTE — Progress Notes (Signed)
   Subjective:    Patient ID: Thomas [name], male    DOB: 26-May-1934, 83 y.o.   MRN: [id number]  Sinus Problem  This is a new problem. The current episode started in the past 7 days. Associated symptoms include chills, congestion, headaches, sinus pressure and a sore throat. (Body aches ) Treatments tried: advil and ice pack.   Sore throat  achey   Frontal headache worse with posion cange  Got shaking chills   achey all over  Pressure and fullnes in the head and achey   Not  Any cogh   temp normal    n no tick bite, no rash   Review of Systems  Constitutional: Positive for chills.  HENT: Positive for congestion, sinus pressure and sore throat.   Neurological: Positive for headaches.       Objective:   Physical Exam  Alert, mild malaise. Hydration good Vitals stable. frontal/ maxillary tenderness evident positive nasal congestion. pharynx normal neck supple  lungs clear/no crackles or wheezes. heart regular in rhythm       Assessment & Plan:  Impression rhinosinusitis likely post viral, discussed with patient. plan antibiotics prescribed. Questions answered. Symptomatic care discussed. warning signs discussed. WSL

## 2017-08-13 DIAGNOSIS — J309 Allergic rhinitis, unspecified: Secondary | ICD-10-CM | POA: Diagnosis not present

## 2017-09-03 ENCOUNTER — Other Ambulatory Visit: Payer: Self-pay | Admitting: Family Medicine

## 2017-09-03 DIAGNOSIS — J309 Allergic rhinitis, unspecified: Secondary | ICD-10-CM | POA: Diagnosis not present

## 2017-09-10 ENCOUNTER — Encounter: Payer: Self-pay | Admitting: Family Medicine

## 2017-09-10 ENCOUNTER — Ambulatory Visit (INDEPENDENT_AMBULATORY_CARE_PROVIDER_SITE_OTHER): Payer: Medicare Other | Admitting: Family Medicine

## 2017-09-10 VITALS — BP 118/76 | Ht 68.0 in | Wt 171.0 lb

## 2017-09-10 DIAGNOSIS — R634 Abnormal weight loss: Secondary | ICD-10-CM | POA: Diagnosis not present

## 2017-09-10 DIAGNOSIS — I10 Essential (primary) hypertension: Secondary | ICD-10-CM

## 2017-09-10 DIAGNOSIS — E038 Other specified hypothyroidism: Secondary | ICD-10-CM | POA: Diagnosis not present

## 2017-09-10 MED ORDER — OMEPRAZOLE 20 MG PO CPDR: DELAYED_RELEASE_CAPSULE | ORAL | 1 refills | Status: DC

## 2017-09-10 MED ORDER — GABAPENTIN 400 MG PO CAPS: 400.0000 mg | ORAL_CAPSULE | Freq: Three times a day (TID) | ORAL | 12 refills | Status: DC

## 2017-09-10 MED ORDER — VALSARTAN-HYDROCHLOROTHIAZIDE 80-12.5 MG PO TABS: ORAL_TABLET | ORAL | 1 refills | Status: DC

## 2017-09-10 MED ORDER — PRAVASTATIN SODIUM 40 MG PO TABS: ORAL_TABLET | ORAL | 1 refills | Status: DC

## 2017-09-10 NOTE — Progress Notes (Signed)
   Subjective:    Patient ID: Thomas [name], male    DOB: 08-17-1934, 83 y.o.   MRN: [id number]  HPI  Patient is here today to follow up on his health issues. He has htn and is taking valsartan 80-12.5 one daily. He eats what he wants and does not have time to do that. 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  Patient for blood pressure check up. Patient relates compliance with meds. Todays BP reviewed with the patient. Patient denies issues with medication. Patient relates reasonable diet. Patient tries to minimize salt. Patient aware of BP goals.  Patient has thyroid condition.  Takes thyroid medication on a regular basis.  States that the proper way.  Relates compliance.  States no negative side effects.  States condition seems to be under good control.  The patient has noted some weight loss but denies high fever chills sweats denies wheezing difficulty breathing denies change in appetite.  States he does not always eat breakfast.  Works a lot of hours.  Review of Systems  Constitutional: Negative for activity change, fatigue and fever.  HENT: Negative for congestion and rhinorrhea.   Respiratory: Negative for cough and shortness of breath.   Cardiovascular: Negative for chest pain and leg swelling.  Gastrointestinal: Negative for abdominal pain, diarrhea and nausea.  Genitourinary: Negative for dysuria and hematuria.  Neurological: Negative for weakness and headaches.  Psychiatric/Behavioral: Negative for behavioral problems.       Objective:   Physical Exam  Constitutional: He appears well-nourished. No distress.  Cardiovascular: Normal rate, regular rhythm and normal heart sounds.  No murmur heard. Pulmonary/Chest: Effort normal and breath sounds normal. No respiratory distress.  Musculoskeletal: He exhibits no edema.  Lymphadenopathy:    He has no cervical adenopathy.  Neurological: He is alert.  Psychiatric: His behavior is normal.  Vitals reviewed.  Patient denies  rectal bleeding denies headaches denies chest pain denies abdominal pain denies hematuria       Assessment & Plan:  Weight loss-it is hard to figure out what is causing this currently we need to check his thyroid testing.  Patient also needs to check his weight weekly and send this to [place] within the next 6 to 8 weeks recent lab work from oncology to look good today's exam is benign If patient has continued weight loss we will need to do additional testing I did encourage patient to have healthy regular eating  Hypothyroidism check thyroid function await the results  Blood pressure good control continue current measures

## 2017-09-10 NOTE — Telephone Encounter (Signed)
These prescriptions were already taking care of today please therefore deny these because I already send in refills

## 2017-09-10 NOTE — Patient Instructions (Signed)
Please do your labs  Recheck here in Sept  Record your weight weekly and send me some readings in 6 weeks

## 2017-09-18 ENCOUNTER — Other Ambulatory Visit (HOSPITAL_COMMUNITY)
Admission: RE | Admit: 2017-09-18 | Discharge: 2017-09-18 | Disposition: A | Discharge status: Home / Self Care | Payer: Medicare Other | Source: Ambulatory Visit | Attending: Family Medicine | Admitting: Family Medicine

## 2017-09-18 DIAGNOSIS — E038 Other specified hypothyroidism: Secondary | ICD-10-CM | POA: Diagnosis not present

## 2017-09-18 LAB — T4, FREE: Free T4: 0.82 ng/dL (ref 0.82–1.77)

## 2017-09-18 LAB — TSH: TSH: 1.032 u[IU]/mL (ref 0.350–4.500)

## 2017-09-19 DIAGNOSIS — J309 Allergic rhinitis, unspecified: Secondary | ICD-10-CM | POA: Diagnosis not present

## 2017-10-01 ENCOUNTER — Other Ambulatory Visit: Payer: Self-pay | Admitting: Family Medicine

## 2017-10-01 DIAGNOSIS — J309 Allergic rhinitis, unspecified: Secondary | ICD-10-CM | POA: Diagnosis not present

## 2017-10-15 DIAGNOSIS — J309 Allergic rhinitis, unspecified: Secondary | ICD-10-CM | POA: Diagnosis not present

## 2017-10-22 ENCOUNTER — Other Ambulatory Visit (HOSPITAL_COMMUNITY): Payer: Self-pay

## 2017-10-22 DIAGNOSIS — D751 Secondary polycythemia: Secondary | ICD-10-CM

## 2017-10-23 ENCOUNTER — Inpatient Hospital Stay (HOSPITAL_COMMUNITY): Payer: Medicare Other | Attending: Internal Medicine

## 2017-10-23 ENCOUNTER — Inpatient Hospital Stay (HOSPITAL_BASED_OUTPATIENT_CLINIC_OR_DEPARTMENT_OTHER): Payer: Medicare Other | Admitting: Internal Medicine

## 2017-10-23 ENCOUNTER — Other Ambulatory Visit (HOSPITAL_COMMUNITY): Payer: Medicare Other

## 2017-10-23 ENCOUNTER — Encounter (HOSPITAL_COMMUNITY): Payer: Self-pay | Admitting: Internal Medicine

## 2017-10-23 ENCOUNTER — Ambulatory Visit (HOSPITAL_COMMUNITY): Payer: Medicare Other | Admitting: Adult Health

## 2017-10-23 VITALS — BP 103/75 | HR 105 | Temp 98.2°F | Resp 16 | Wt 174.4 lb

## 2017-10-23 DIAGNOSIS — K4090 Unilateral inguinal hernia, without obstruction or gangrene, not specified as recurrent: Secondary | ICD-10-CM | POA: Insufficient documentation

## 2017-10-23 DIAGNOSIS — Z79899 Other long term (current) drug therapy: Secondary | ICD-10-CM | POA: Diagnosis not present

## 2017-10-23 DIAGNOSIS — E039 Hypothyroidism, unspecified: Secondary | ICD-10-CM | POA: Diagnosis not present

## 2017-10-23 DIAGNOSIS — E041 Nontoxic single thyroid nodule: Secondary | ICD-10-CM | POA: Diagnosis not present

## 2017-10-23 DIAGNOSIS — I10 Essential (primary) hypertension: Secondary | ICD-10-CM | POA: Insufficient documentation

## 2017-10-23 DIAGNOSIS — E785 Hyperlipidemia, unspecified: Secondary | ICD-10-CM | POA: Diagnosis not present

## 2017-10-23 DIAGNOSIS — I2510 Atherosclerotic heart disease of native coronary artery without angina pectoris: Secondary | ICD-10-CM | POA: Diagnosis not present

## 2017-10-23 DIAGNOSIS — J45909 Unspecified asthma, uncomplicated: Secondary | ICD-10-CM | POA: Diagnosis not present

## 2017-10-23 DIAGNOSIS — D751 Secondary polycythemia: Secondary | ICD-10-CM

## 2017-10-23 DIAGNOSIS — K219 Gastro-esophageal reflux disease without esophagitis: Secondary | ICD-10-CM | POA: Insufficient documentation

## 2017-10-23 LAB — FERRITIN: Ferritin: 54 ng/mL (ref 24–336)

## 2017-10-23 LAB — COMPREHENSIVE METABOLIC PANEL
ALBUMIN: 4 g/dL (ref 3.5–5.0)
ALK PHOS: 57 U/L (ref 38–126)
ALT: 22 U/L (ref 0–44)
ANION GAP: 7 (ref 5–15)
AST: 24 U/L (ref 15–41)
BILIRUBIN TOTAL: 1.1 mg/dL (ref 0.3–1.2)
BUN: 12 mg/dL (ref 8–23)
CALCIUM: 8.9 mg/dL (ref 8.9–10.3)
CO2: 27 mmol/L (ref 22–32)
Chloride: 104 mmol/L (ref 98–111)
Creatinine, Ser: 1.05 mg/dL (ref 0.61–1.24)
GFR calc Af Amer: >60 mL/min (ref >60)
GFR calc non Af Amer: >60 mL/min (ref >60)
GLUCOSE: 85 mg/dL (ref 70–99)
Potassium: 3.6 mmol/L (ref 3.5–5.1)
Sodium: 138 mmol/L (ref 135–145)
TOTAL PROTEIN: 6.9 g/dL (ref 6.5–8.1)

## 2017-10-23 LAB — CBC WITH DIFFERENTIAL/PLATELET
BASOS PCT: 1 %
Basophils Absolute: 0 10*3/uL (ref 0.0–0.1)
Eosinophils Absolute: 0.2 10*3/uL (ref 0.0–0.7)
Eosinophils Relative: 3 %
HCT: 48.6 % (ref 39.0–52.0)
HEMOGLOBIN: 17.1 g/dL — AB (ref 13.0–17.0)
LYMPHS ABS: 1.4 10*3/uL (ref 0.7–4.0)
LYMPHS PCT: 21 %
MCH: 32.8 pg (ref 26.0–34.0)
MCHC: 35.2 g/dL (ref 30.0–36.0)
MCV: 93.3 fL (ref 78.0–100.0)
MONO ABS: 0.5 10*3/uL (ref 0.1–1.0)
MONOS PCT: 8 %
NEUTROS PCT: 67 %
Neutro Abs: 4.6 10*3/uL (ref 1.7–7.7)
Platelets: 163 10*3/uL (ref 150–400)
RBC: 5.21 MIL/uL (ref 4.22–5.81)
RDW: 13.3 % (ref 11.5–15.5)
WBC: 6.8 10*3/uL (ref 4.0–10.5)

## 2017-10-23 NOTE — Progress Notes (Signed)
Diagnosis Secondary polycythemia - Plan: CBC with Differential/Platelet, Comprehensive metabolic panel, Lactate dehydrogenase, Ferritin  Staging Cancer Staging No matching staging information was found for the patient.  Assessment and Plan:  1.  Secondary polycythemia:  -CALR/JAK2/MPL negative. Thought to possibly be secondary to sleep apnea. No h/o bone marrow biopsy.   -Last phlebotomy was done on 09/24/16 per records. Prior to that, therapeutic phlebotomy was done on 05/02/16.  Generally requires phlebotomy approx every 5-6 months.  Labs done  10/23/2017 reviewed with pt and shows WBC 6.8 HB 17 HCT 48.6 plts 163,000.  Counts are stable and HCT <50.  Pt remains on observation.  He has option of Baby ASA.  He will RTC in 03/2018 for follow-up and repeat labs.    2.  Hypothyroidism.  Pt is on Synthroid.  Follow-up with PCP for monitoring.    3.  HTN.  BP is 103/75.  Follow-up with PCP for management.    Current Status:  Pt is seen today for follow-up.  He is here to go over labs.    Problem List Patient Active Problem List   Diagnosis Date Noted  . Inguinal hernia [K40.90] 04/26/2016  . Abnormal CT of the abdomen [R93.5] 02/21/2016  . Hyperlipemia [E78.5] 09/14/2015  . Chest pain, rule out acute myocardial infarction [R07.9] 02/04/2015  . Chest pain [R07.9] 02/04/2015  . Polycythemia, secondary [D75.1] 09/11/2013  . Allergic rhinitis [J30.9] 09/11/2013  . Hypothyroidism [E03.9] 06/01/2013  . Reactive airway disease [J45.909] 08/29/2012  . Thyroid nodule [E04.1] 08/29/2012  . Chest pain, musculoskeletal [R07.89] 05/07/2011  . GERD (gastroesophageal reflux disease) [K21.9] 05/07/2011  . HTN (hypertension), benign [I10] 12/09/2009  . Essential hypertension [I10] 12/09/2009  . CORONARY ATHEROSCLEROSIS NATIVE CORONARY ARTERY [I25.10] 12/09/2009  . ABNORMAL ELECTROCARDIOGRAM [R94.31] 12/09/2009    Past Medical History Past Medical History:  Diagnosis Date  . Arthritis    "my  entire back is gone"  . Asthma   . GERD (gastroesophageal reflux disease)   . H/O hiatal hernia   . Hernia   . History of stress test 2011  . Hypertension   . Hypothyroidism   . Kidney stones   . Pollen allergies   . Polycythemia   . Polycythemia, secondary 09/11/2013  . Renal calculus    some removed by Cystoscopy, one passed spontaneously  . Sleep apnea    mild no cpap    Past Surgical History Past Surgical History:  Procedure Laterality Date  . APPENDECTOMY     50 years ago  . BACK SURGERY     L-4 , L-5  patient states that it was years ago.  Marland Kitchen COLONOSCOPY     at age 35  . COLONOSCOPY N/A 04/06/2016   Procedure: COLONOSCOPY;  Surgeon: Rogene Houston, MD;  Location: [facility];  Service: Endoscopy;  Laterality: N/A;  7:30  . EYE SURGERY     cataracts removed- bilateral- /w IOL  . INGUINAL HERNIA REPAIR Left 05/14/2016   Procedure: OPEN REPAIR OF SYMPTOMATIC (PAINFUL) LEFT INGUINAL HERNIA WITH MESH;  Surgeon: Vickie Epley, MD;  Location: [facility];  Service: General;  Laterality: Left;  . THYROIDECTOMY Right 09/17/2012   Procedure: RIGHT HEMI-THYROIDECTOMY;  Surgeon: Ascencion Dike, MD;  Location: [place];  Service: ENT;  Laterality: Right;  . UPPER GASTROINTESTINAL ENDOSCOPY     egd/ed  . YAG LASER APPLICATION Left 56/31/4970   Procedure: YAG LASER APPLICATION;  Surgeon: Rutherford Guys, MD;  Location: [facility];  Service: Ophthalmology;  Laterality: Left;  Family History Family History  Problem Relation Age of Onset  . Heart disease Mother   . Heart disease Father   . Heart disease Brother   . Healthy Son      Social History  reports that he has never smoked. His smokeless tobacco use includes chew. He reports that he does not drink alcohol or use drugs.  Medications  Current Outpatient Medications:  .  albuterol (PROVENTIL HFA;VENTOLIN HFA) 108 (90 Base) MCG/ACT inhaler, Inhale 2 puffs into the lungs every 4 (four) hours as needed., Disp: 1 Inhaler, Rfl: 5 .   budesonide-formoterol (SYMBICORT) 80-4.5 MCG/ACT inhaler, Inhale 1 puff into the lungs 2 (two) times daily., Disp: , Rfl:  .  fluticasone (FLONASE) 50 MCG/ACT nasal spray, INHALE 1 SPRAY INTO EACH NOSTRIL TWICE DAILY., Disp: 16 g, Rfl: 5 .  fluticasone (FLONASE) 50 MCG/ACT nasal spray, INHALE 1 SPRAY INTO EACH NOSTRIL TWICE DAILY., Disp: 16 g, Rfl: 5 .  gabapentin (NEURONTIN) 400 MG capsule, Take 1 capsule (400 mg total) by mouth 3 (three) times daily., Disp: 90 capsule, Rfl: 12 .  Hypromellose (ARTIFICIAL TEARS OP), Place 1 drop into both eyes daily as needed (dry eyes). , Disp: , Rfl:  .  ibuprofen (ADVIL,MOTRIN) 200 MG tablet, Take 400 mg by mouth 2 (two) times daily as needed for headache or moderate pain., Disp: , Rfl:  .  ketoconazole (NIZORAL) 2 % cream, APPLY TO AFFECTED AREA TWO TIMES DAILY AS NEEDED FOR IRRITATION., Disp: 60 g, Rfl: 0 .  levothyroxine (SYNTHROID, LEVOTHROID) 100 MCG tablet, TAKE ONE TABLET BY MOUTH DAILY BEFORE BREAKFAST., Disp: 90 tablet, Rfl: 0 .  omeprazole (PRILOSEC) 20 MG capsule, 1 qd, Disp: 90 capsule, Rfl: 1 .  pravastatin (PRAVACHOL) 40 MG tablet, TAKE ONE TABLET BY MOUTH AT BEDTIME., Disp: 90 tablet, Rfl: 1 .  triamcinolone cream (KENALOG) 0.1 %, Apply 1 application topically 2 (two) times daily. (Patient taking differently: Apply 1 application topically 2 (two) times daily as needed (rash). ), Disp: 45 g, Rfl: 2 .  valsartan-hydrochlorothiazide (DIOVAN-HCT) 80-12.5 MG tablet, 1 qd, Disp: 90 tablet, Rfl: 1  Allergies Penicillins  Review of Systems Review of Systems - Oncology ROS negative   Physical Exam  Vitals Wt Readings from Last 3 Encounters:  10/23/17 174 lb 6.4 oz (79.1 kg)  09/10/17 171 lb (77.6 kg)  08/05/17 176 lb (79.8 kg)   Temp Readings from Last 3 Encounters:  10/23/17 98.2 F (36.8 C) (Oral)  08/05/17 99.7 F (37.6 C) (Oral)  05/01/17 97.9 F (36.6 C) (Oral)   BP Readings from Last 3 Encounters:  10/23/17 103/75  09/10/17  118/76  08/05/17 122/82   Pulse Readings from Last 3 Encounters:  10/23/17 (!) 105  05/01/17 (!) 55  04/25/17 (!) 49    Constitutional: Well-developed, well-nourished, and in no distress.   HENT: Head: Normocephalic and atraumatic.  Mouth/Throat: No oropharyngeal exudate. Mucosa moist. Eyes: Pupils are equal, round, and reactive to light. Conjunctivae are normal. No scleral icterus.  Neck: Normal range of motion. Neck supple. No JVD present.  Cardiovascular: Normal rate, regular rhythm and normal heart sounds.  Exam reveals no gallop and no friction rub.   No murmur heard. Pulmonary/Chest: Effort normal and breath sounds normal. No respiratory distress. No wheezes.No rales.  Abdominal: Soft. Bowel sounds are normal. No distension. There is no tenderness. There is no guarding.  Musculoskeletal: No edema or tenderness.  Lymphadenopathy: No cervical, axillary or supraclavicular adenopathy.  Neurological: Alert and oriented to person, place,  and time. No cranial nerve deficit.  Skin: Skin is warm and dry. No rash noted. No erythema. No pallor.  Psychiatric: Affect and judgment normal.   Labs Appointment on 10/23/2017  Component Date Value Ref Range Status  . WBC 10/23/2017 6.8  4.0 - 10.5 K/uL Final  . RBC 10/23/2017 5.21  4.22 - 5.81 MIL/uL Final  . Hemoglobin 10/23/2017 17.1* 13.0 - 17.0 g/dL Final  . HCT 10/23/2017 48.6  39.0 - 52.0 % Final  . MCV 10/23/2017 93.3  78.0 - 100.0 fL Final  . MCH 10/23/2017 32.8  26.0 - 34.0 pg Final  . MCHC 10/23/2017 35.2  30.0 - 36.0 g/dL Final  . RDW 10/23/2017 13.3  11.5 - 15.5 % Final  . Platelets 10/23/2017 163  150 - 400 K/uL Final  . Neutrophils Relative % 10/23/2017 67  % Final  . Neutro Abs 10/23/2017 4.6  1.7 - 7.7 K/uL Final  . Lymphocytes Relative 10/23/2017 21  % Final  . Lymphs Abs 10/23/2017 1.4  0.7 - 4.0 K/uL Final  . Monocytes Relative 10/23/2017 8  % Final  . Monocytes Absolute 10/23/2017 0.5  0.1 - 1.0 K/uL Final  .  Eosinophils Relative 10/23/2017 3  % Final  . Eosinophils Absolute 10/23/2017 0.2  0.0 - 0.7 K/uL Final  . Basophils Relative 10/23/2017 1  % Final  . Basophils Absolute 10/23/2017 0.0  0.0 - 0.1 K/uL Final   Performed at [facility], [address], [place], [place] [address]  . Sodium 10/23/2017 138  135 - 145 mmol/L Final  . Potassium 10/23/2017 3.6  3.5 - 5.1 mmol/L Final  . Chloride 10/23/2017 104  98 - 111 mmol/L Final   Please note change in reference range.  . CO2 10/23/2017 27  22 - 32 mmol/L Final  . Glucose, Bld 10/23/2017 85  70 - 99 mg/dL Final   Please note change in reference range.  . BUN 10/23/2017 12  8 - 23 mg/dL Final   Please note change in reference range.  . Creatinine, Ser 10/23/2017 1.05  0.61 - 1.24 mg/dL Final  . Calcium 10/23/2017 8.9  8.9 - 10.3 mg/dL Final  . Total Protein 10/23/2017 6.9  6.5 - 8.1 g/dL Final  . Albumin 10/23/2017 4.0  3.5 - 5.0 g/dL Final  . AST 10/23/2017 24  15 - 41 U/L Final  . ALT 10/23/2017 22  0 - 44 U/L Final   Please note change in reference range.  . Alkaline Phosphatase 10/23/2017 57  38 - 126 U/L Final  . Total Bilirubin 10/23/2017 1.1  0.3 - 1.2 mg/dL Final  . GFR calc non Af Amer 10/23/2017 >60  >60 mL/min Final  . GFR calc Af Amer 10/23/2017 >60  >60 mL/min Final   Comment: (NOTE) The eGFR has been calculated using the CKD EPI equation. This calculation has not been validated in all clinical situations. eGFR's persistently <60 mL/min signify possible Chronic Kidney Disease.   Georgiann Hahn gap 10/23/2017 7  5 - 15 Final   Performed at [facility], [address] [place], [place] [address]     Pathology Orders Placed This Encounter  Procedures  . CBC with Differential/Platelet    Standing Status:   Future    Standing Expiration Date:   10/24/2018  . Comprehensive metabolic panel    Standing Status:   Future    Standing Expiration Date:   10/24/2018  . Lactate dehydrogenase    Standing Status:   Future  Standing  Expiration Date:   10/24/2018  . Ferritin    Standing Status:   Future    Standing Expiration Date:   10/24/2018       Zoila Shutter MD

## 2017-10-23 NOTE — Patient Instructions (Signed)
[facility] Discharge Instructions  You saw Dr. Higgs today.   Thank you for choosing [facility] at [facility] to provide your oncology and hematology care.  To afford each patient quality time with our provider, please arrive at least 15 minutes before your scheduled appointment time.   If you have a lab appointment with the Cancer Center please come in thru the  Main Entrance and check in at the main information desk  You need to re-schedule your appointment should you arrive 10 or more minutes late.  We strive to give you quality time with our providers, and arriving late affects you and other patients whose appointments are after yours.  Also, if you no show three or more times for appointments you may be dismissed from the clinic at the providers discretion.     Again, thank you for choosing [facility].  Our hope is that these requests will decrease the amount of time that you wait before being seen by our physicians.       _____________________________________________________________  Should you have questions after your visit to [facility], please contact our office at [phone] between the hours of 8:30 a.m. and 4:30 p.m.  Voicemails left after 4:30 p.m. will not be returned until the following business day.  For prescription refill requests, have your pharmacy contact our office.       Resources For Cancer Patients and their Caregivers ? American Cancer Society: Can assist with transportation, wigs, general needs, runs Look Good Feel Better.        1-888-227-6333 ? Cancer Care: Provides financial assistance, online support groups, medication/co-pay assistance.  1-800-813-HOPE (4673) ? [facility] Assists [place] cancer patients and their families through emotional , educational and financial support.  336-427-4357 ? [place] DSS Where to apply for food  stamps, Medicaid and utility assistance. 336-342-1394 ? [facility]: Transportation to medical appointments. 336-347-2287 ? Social Security Administration: May apply for disability if have a Stage IV cancer. 336-342-7796 1-800-772-1213 ? [place] Aging, Disability and Transit Services: Assists with nutrition, care and transit needs. 336-349-2343  Cancer Center Support Programs:   > Cancer Support Group  2nd Tuesday of the month 1pm-2pm, Journey Room   > Creative Journey  3rd Tuesday of the month 1130am-1pm, Journey Room     

## 2017-10-24 DIAGNOSIS — J3089 Other allergic rhinitis: Secondary | ICD-10-CM | POA: Diagnosis not present

## 2017-10-29 DIAGNOSIS — J309 Allergic rhinitis, unspecified: Secondary | ICD-10-CM | POA: Diagnosis not present

## 2017-11-12 DIAGNOSIS — J309 Allergic rhinitis, unspecified: Secondary | ICD-10-CM | POA: Diagnosis not present

## 2017-12-04 DIAGNOSIS — K219 Gastro-esophageal reflux disease without esophagitis: Secondary | ICD-10-CM | POA: Diagnosis not present

## 2017-12-04 DIAGNOSIS — J4540 Moderate persistent asthma, uncomplicated: Secondary | ICD-10-CM | POA: Diagnosis not present

## 2017-12-04 DIAGNOSIS — J3089 Other allergic rhinitis: Secondary | ICD-10-CM | POA: Diagnosis not present

## 2017-12-07 ENCOUNTER — Other Ambulatory Visit: Payer: Self-pay | Admitting: Family Medicine

## 2017-12-08 ENCOUNTER — Telehealth: Payer: Self-pay | Admitting: Family Medicine

## 2017-12-08 NOTE — Telephone Encounter (Signed)
The patient dropped off weights from late May through the mid July.  His weight fluctuates from approximately 166.8 in May, 170.6 late June, 168.8 mid July He has follow-up visit later and he September we will recheck weights at that time

## 2017-12-12 DIAGNOSIS — J309 Allergic rhinitis, unspecified: Secondary | ICD-10-CM | POA: Diagnosis not present

## 2017-12-26 DIAGNOSIS — J309 Allergic rhinitis, unspecified: Secondary | ICD-10-CM | POA: Diagnosis not present

## 2018-01-02 ENCOUNTER — Other Ambulatory Visit: Payer: Self-pay | Admitting: Family Medicine

## 2018-01-06 ENCOUNTER — Ambulatory Visit (INDEPENDENT_AMBULATORY_CARE_PROVIDER_SITE_OTHER): Payer: Medicare Other | Admitting: Otolaryngology

## 2018-01-06 DIAGNOSIS — J310 Chronic rhinitis: Secondary | ICD-10-CM

## 2018-01-09 DIAGNOSIS — J309 Allergic rhinitis, unspecified: Secondary | ICD-10-CM | POA: Diagnosis not present

## 2018-01-09 DIAGNOSIS — Z23 Encounter for immunization: Secondary | ICD-10-CM | POA: Diagnosis not present

## 2018-01-10 ENCOUNTER — Ambulatory Visit (INDEPENDENT_AMBULATORY_CARE_PROVIDER_SITE_OTHER): Payer: Medicare Other | Admitting: Family Medicine

## 2018-01-10 ENCOUNTER — Encounter: Payer: Self-pay | Admitting: Family Medicine

## 2018-01-10 VITALS — BP 120/76 | Ht 68.0 in | Wt 173.0 lb

## 2018-01-10 DIAGNOSIS — E785 Hyperlipidemia, unspecified: Secondary | ICD-10-CM

## 2018-01-10 DIAGNOSIS — I491 Atrial premature depolarization: Secondary | ICD-10-CM

## 2018-01-10 DIAGNOSIS — K219 Gastro-esophageal reflux disease without esophagitis: Secondary | ICD-10-CM | POA: Diagnosis not present

## 2018-01-10 DIAGNOSIS — E038 Other specified hypothyroidism: Secondary | ICD-10-CM | POA: Diagnosis not present

## 2018-01-10 DIAGNOSIS — I10 Essential (primary) hypertension: Secondary | ICD-10-CM | POA: Diagnosis not present

## 2018-01-10 NOTE — Progress Notes (Addendum)
Subjective:    Patient ID: Thomas [name], male    DOB: 04-Sep-1934, 83 y.o.   MRN: [id number]   Hypertension   This is a chronic problem. The current episode started more than 1 year ago. Pertinent negatives include no chest pain, headaches or shortness of breath. Risk factors for coronary artery disease include male gender and dyslipidemia. Treatments tried: diovan.  There are no compliance problems.   The patient denies any chest tightness pressure pain  Patient has chronic asthma-the patient does use his inhalers as directed He denies any shortness of breath issues Hot weather does get to him and causes some difficulty Patient would like to check red spots on shoulder.  Patient has thyroid condition.  Takes thyroid medication on a regular basis.  States that the proper way.  Relates compliance.  States no negative side effects.  States condition seems to be under good control.  Patient does have ongoing trouble with reflux.  Takes medication on a regular basis.  Tries to minimize foods as best they can.  They understand the importance of dietary compliance.  May also try to avoid eating a large meal close to bedtime.  Patient denies any dysphagia denies hematochezia.  States medicine does a good job keeping the problem under good control.  Without the medication may certainly have issues.They desire to continue taking their medication.  Review of Systems  Constitutional: Negative for diaphoresis and fatigue.  HENT: Negative for congestion and rhinorrhea.   Respiratory: Negative for cough and shortness of breath.   Cardiovascular: Negative for chest pain and leg swelling.  Gastrointestinal: Negative for abdominal pain and diarrhea.  Skin: Negative for color change and rash.  Neurological: Negative for dizziness and headaches.  Psychiatric/Behavioral: Negative for behavioral problems and confusion.   This EKG was compared to the most recent previous EKG available in the electronic  records.  (Please see previous EKGs most recent EKG under the EKG heading.) EKG shows premature atrial contractions    Objective:   Physical Exam  Constitutional: He appears well-nourished. No distress.  HENT:  Head: Normocephalic and atraumatic.  Eyes: Right eye exhibits no discharge. Left eye exhibits no discharge.  Neck: No tracheal deviation present.  Cardiovascular: Normal rate and normal heart sounds.  No murmur heard. Pulmonary/Chest: Effort normal and breath sounds normal. No respiratory distress. He has no wheezes.  Musculoskeletal: He exhibits no edema.  Lymphadenopathy:    He has no cervical adenopathy.  Neurological: He is alert.  Skin: Skin is warm. No rash noted.  Psychiatric: His behavior is normal.  Vitals reviewed.  Heart has a regular rhythm to it.There are several beats in a row that are corrected then suddenly there is some better out of rhythm Rash on upper back consistent with a mild contact dermatitis      Assessment & Plan:  Rash noted on upper back will go ahead and send in cream for this should gradually go away  Premature atrial contractions no sign of any serious disease monitor for now-patient was warned what to watch for.  It is not impeding his ability currently  Patient does have chronic asthma under good control with his current measures avoid excessive heat  HTN- Patient was seen today as part of a visit regarding hypertension. The importance of healthy diet and regular physical activity was discussed. The importance of compliance with medications discussed.  Ideal goal is to keep blood pressure low elevated levels certainly below 419/37 when possible.  The patient  was counseled that keeping blood pressure under control lessen his risk of complications.  The importance of regular follow-ups was discussed with the patient.  Low-salt diet such as DASH recommended.  Regular physical activity was recommended as well.  Patient was advised to keep  regular follow-ups.  Patient was seen today regarding hypothyroidism.  Importance of healthy diet, regular physical activity was discussed.  Importance of compliance with medication and regular checks regarding this was discussed.   The patient was seen today for GERD. Patient benefits from medication. Patient to continue medication. Keep all regular follow ups.   Blood pressure good control continue current measures  Continue thyroid and cholesterol medicine  Lab work by later this year we will send him paperwork  Follow-up in approximately 6 months sooner problems

## 2018-01-11 DIAGNOSIS — I491 Atrial premature depolarization: Secondary | ICD-10-CM | POA: Insufficient documentation

## 2018-01-13 NOTE — Progress Notes (Signed)
Blood work ordered in Epic. 

## 2018-01-13 NOTE — Addendum Note (Signed)
Addended by: Dairl Ponder on: 01/13/2018 04:19 PM   Modules accepted: Orders

## 2018-01-14 ENCOUNTER — Encounter: Payer: Self-pay | Admitting: Family Medicine

## 2018-01-19 IMAGING — CT CT MAXILLOFACIAL W/O CM
3 of 4 series · 13 of 47 positions shown, 15 images · non-contrast
Comparison: None.

CLINICAL DATA: 80-year-old male with recurrent left side sinus
infections. Initial encounter.

EXAM:
CT MAXILLOFACIAL WITHOUT CONTRAST
TECHNIQUE: Multidetector CT imaging of the maxillofacial structures was
performed. Multiplanar CT image reconstructions were also generated.
A small metallic BB was placed on the right temple in order to
reliably differentiate right from left.

[Series 3: stealth sinus sinus alg 1.0 · axial · 0.37mm/px · z∈[+66,+158]mm · 7 of 108 slices shown, 9 images]
[im 8/108  brain]
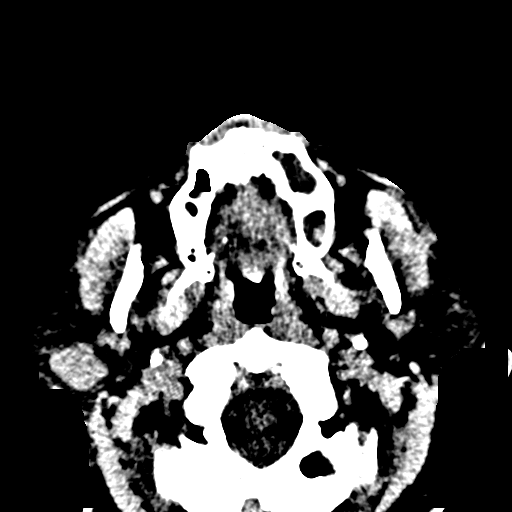
[im 8/108  bone]
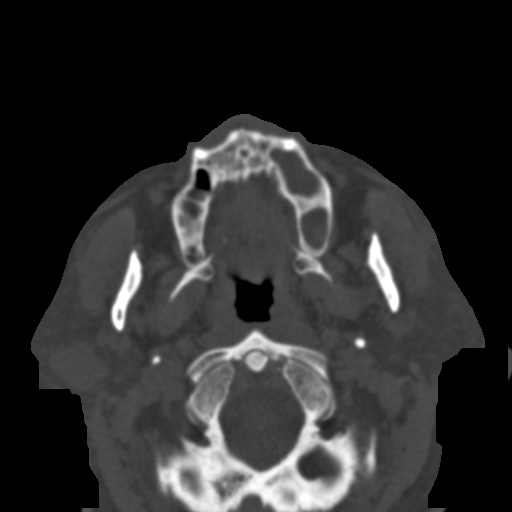
[im 23/108  bone]
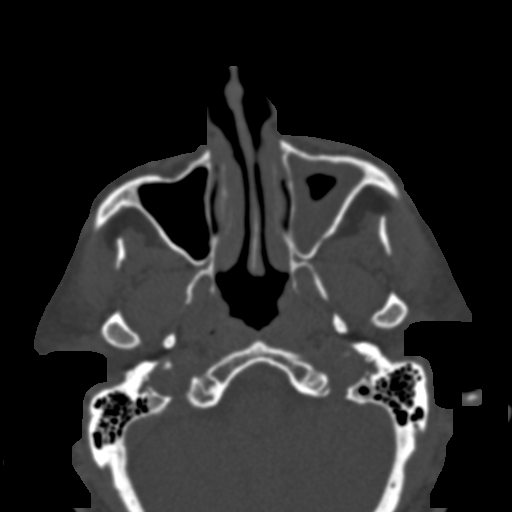
[im 39/108  bone]
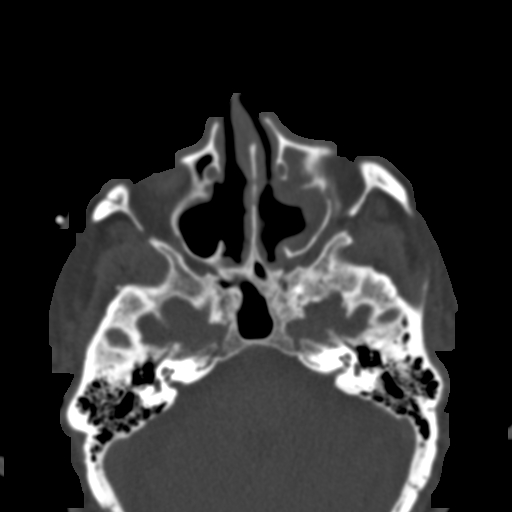
[im 54/108  bone]
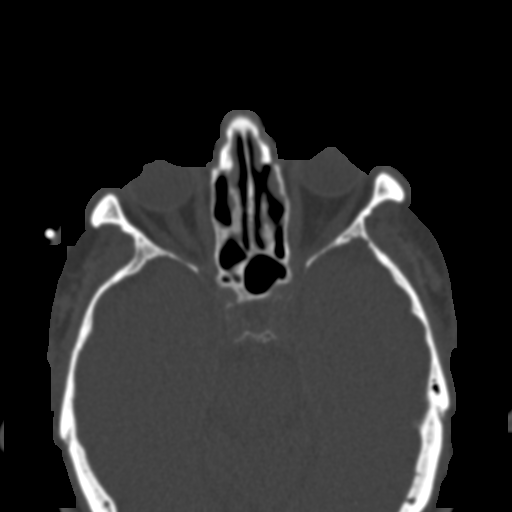
[im 69/108  brain]
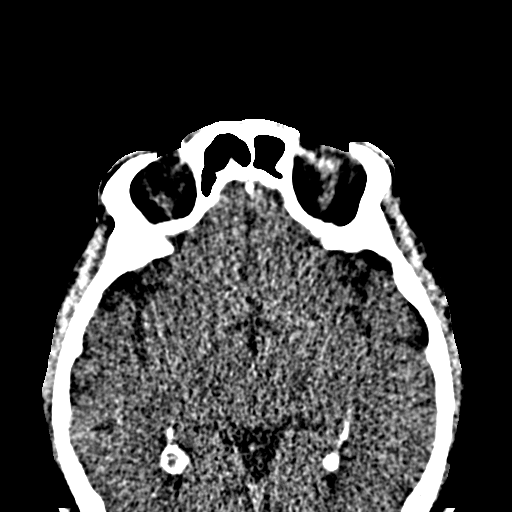
[im 69/108  bone]
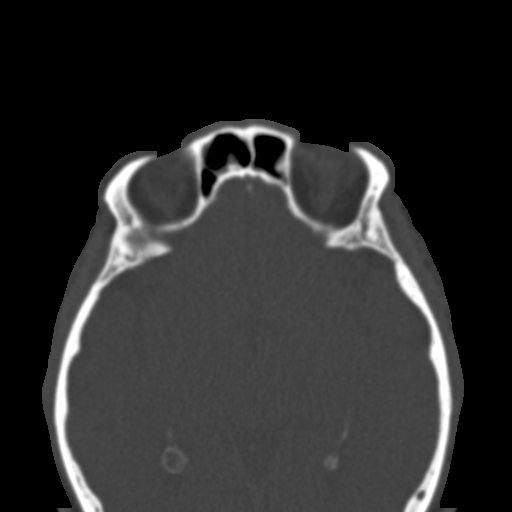
[im 85/108  bone]
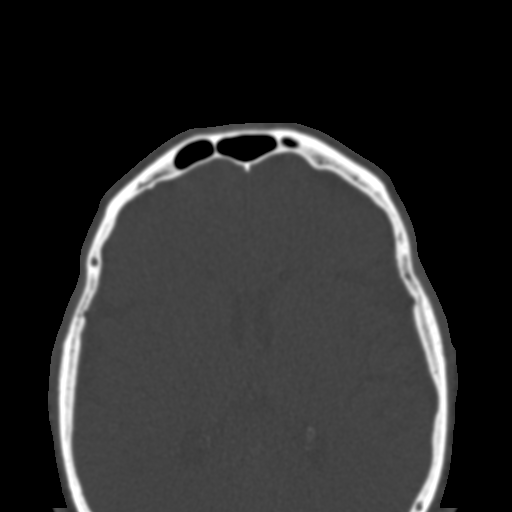
[im 100/108  bone]
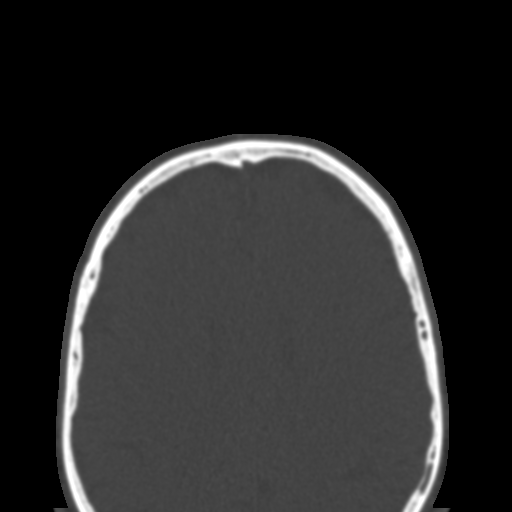

[Series 4: stealth sinus coro 2.0 · coronal · 0.21mm/px · 3 of 87 slices shown]
[im 29/87  bone]
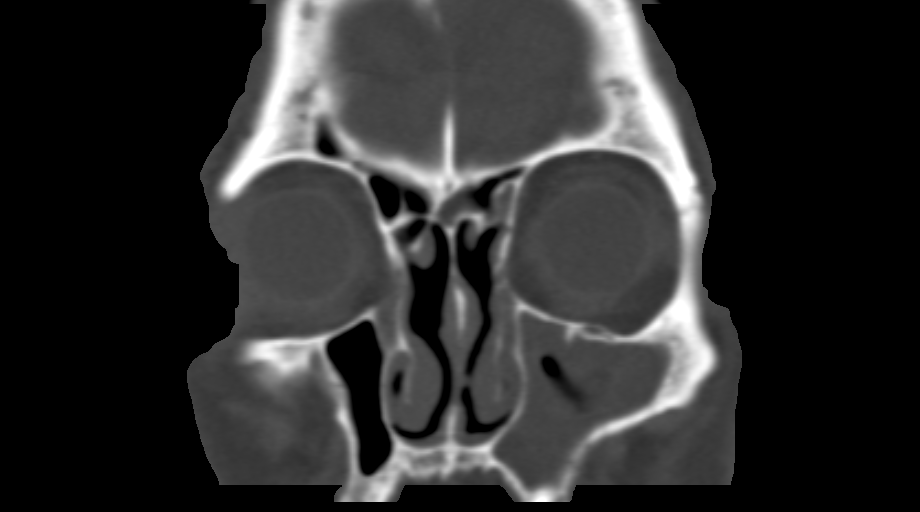
[im 39/87  bone]
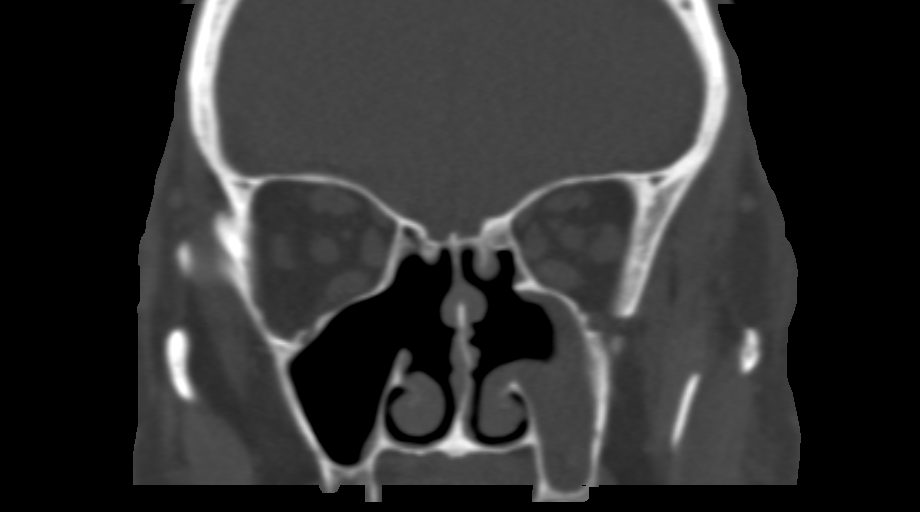
[im 48/87  bone]
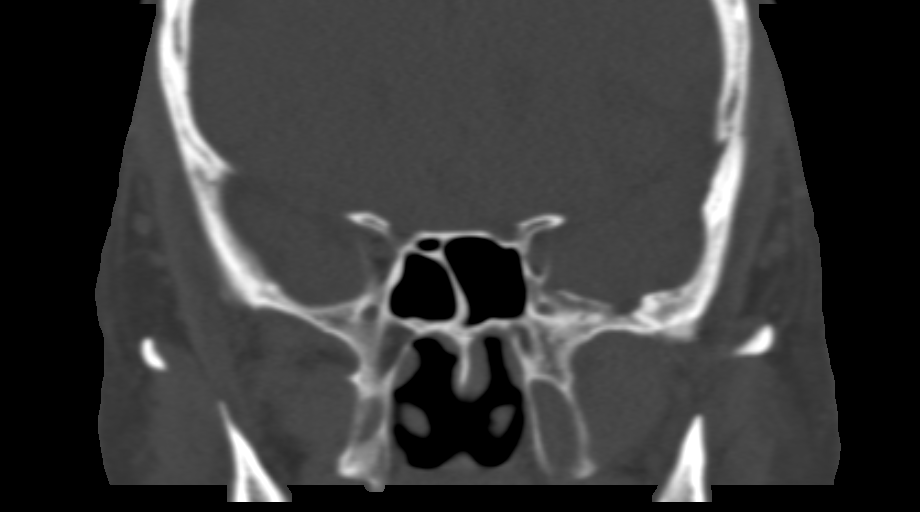

[Series 5: stealth sinus sag 2.0 · sagittal · 0.22mm/px · 3 of 97 slices shown]
[im 33/97  bone]
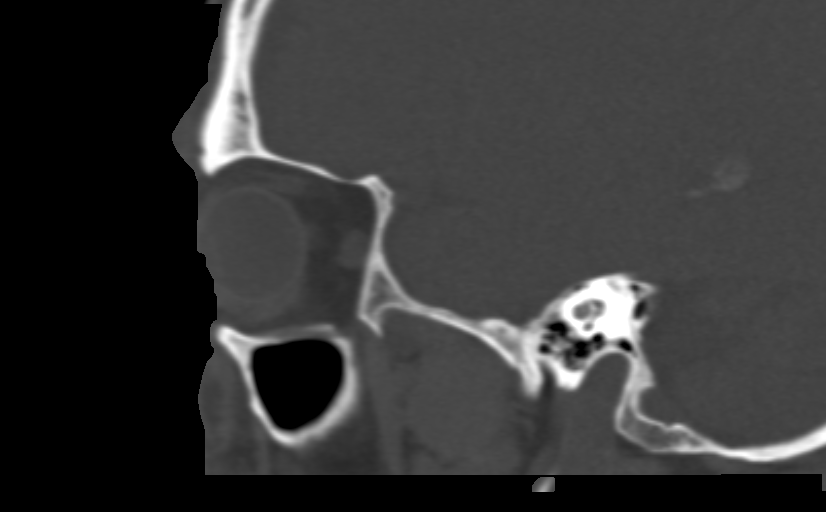
[im 49/97  bone]
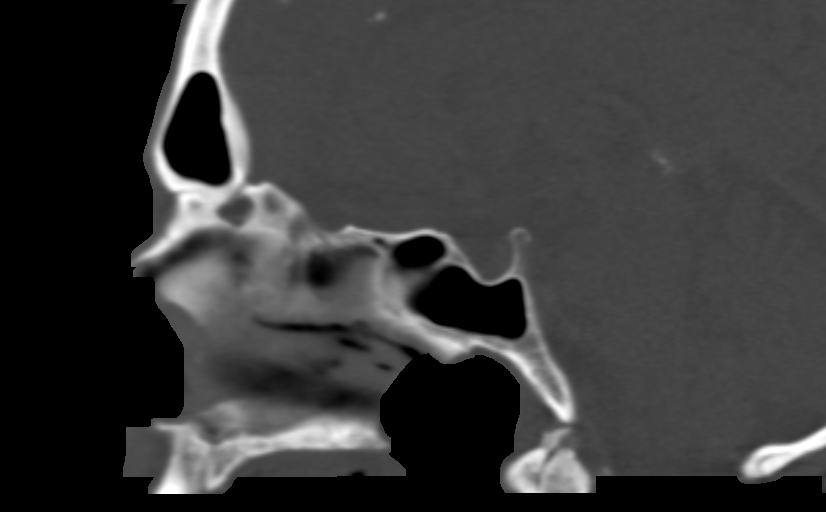
[im 65/97  bone]
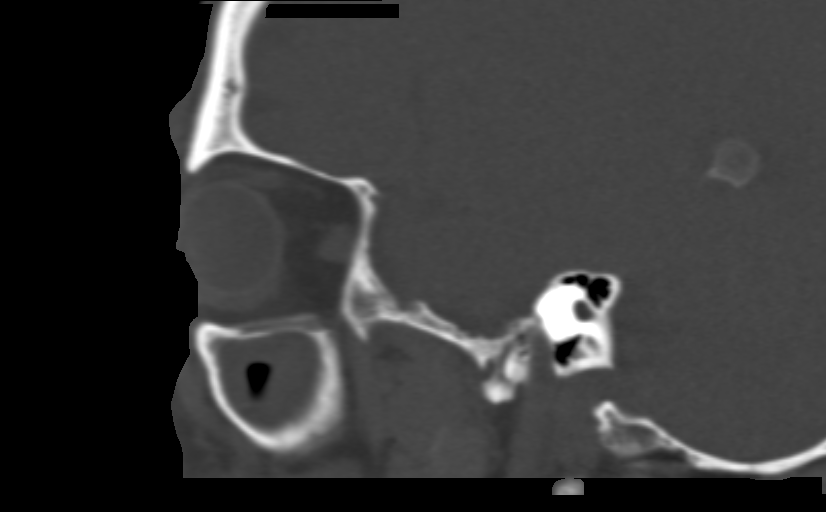

[13 of 47 positions shown; findings below may reference images not displayed]

FINDINGS: Negative for age visible noncontrast brain parenchyma. Postoperative
changes to the globes, otherwise negative visualized orbit and scalp
soft tissues. Negative visualized noncontrast deep soft tissue
spaces of the face.

Bilateral tympanic cavities and mastoids are clear.

Previous bilateral maxillary antrostomies and ethmoidectomies.
Moderate opacification of the left maxillary sinus and antrostomy,
continuing into the lateral aspect of the left ethmoid cavity, and
involving the left frontoethmoidal recess.

There is also mild residual mucosal thickening in the right
maxillary sinus, at the right maxillary antrostomy, and right
frontoethmoidal recess.

Otherwise both frontal sinuses are clear.

Previous right side sphenoid osteotomy also demonstrated with mild
osteotomy site mucosal thickening.

Otherwise both sphenoid sinuses are clear.

There is diffuse sinus periostitis.

Symmetric appearing nasal cavity mucosal thickening. No acute
osseous abnormality identified.
IMPRESSION: Chronic paranasal sinusitis with bilateral postoperative changes.
Recurrent sinus inflammation greater on the left. Symmetric nasal
cavity mucosal thickening suggesting rhinitis.

## 2018-01-21 DIAGNOSIS — J309 Allergic rhinitis, unspecified: Secondary | ICD-10-CM | POA: Diagnosis not present

## 2018-02-04 DIAGNOSIS — J309 Allergic rhinitis, unspecified: Secondary | ICD-10-CM | POA: Diagnosis not present

## 2018-02-18 DIAGNOSIS — J309 Allergic rhinitis, unspecified: Secondary | ICD-10-CM | POA: Diagnosis not present

## 2018-03-04 DIAGNOSIS — J309 Allergic rhinitis, unspecified: Secondary | ICD-10-CM | POA: Diagnosis not present

## 2018-03-11 ENCOUNTER — Encounter: Payer: Self-pay | Admitting: Family Medicine

## 2018-03-11 ENCOUNTER — Ambulatory Visit (HOSPITAL_COMMUNITY)
Admission: RE | Admit: 2018-03-11 | Discharge: 2018-03-11 | Disposition: A | Discharge status: Home / Self Care | Payer: Medicare Other | Source: Ambulatory Visit | Attending: Family Medicine | Admitting: Family Medicine

## 2018-03-11 ENCOUNTER — Ambulatory Visit (INDEPENDENT_AMBULATORY_CARE_PROVIDER_SITE_OTHER): Payer: Medicare Other | Admitting: Family Medicine

## 2018-03-11 VITALS — BP 136/78 | Temp 98.4°F | Ht 68.0 in | Wt 172.2 lb

## 2018-03-11 DIAGNOSIS — R0781 Pleurodynia: Secondary | ICD-10-CM | POA: Diagnosis not present

## 2018-03-11 MED ORDER — NAPROXEN 500 MG PO TABS
ORAL_TABLET | ORAL | 0 refills | Status: DC
Start: 2018-03-11 — End: 2019-06-16

## 2018-03-11 NOTE — Progress Notes (Signed)
   Subjective:    Patient ID: Thomas [name], male    DOB: 1934-07-14, 83 y.o.   MRN: [id number]  Chest Pain   This is a new problem. The current episode started 1 to 4 weeks ago. The pain radiates to the left shoulder, lower back, mid back, right shoulder and upper back. Associated symptoms include abdominal pain and back pain. Pertinent negatives include no cough, headaches, nausea, shortness of breath, vomiting or weakness. He has tried NSAIDs (Advil) for the symptoms. The treatment provided mild relief.  Rib pain and discomfort bilateral hurts with certain movements rotation he is been doing a lot of work lately tried Advil definitely seem to help having some muscle spasms has a history of pneumonia in the past he was concerned something could be going on with his lungs causing this    Review of Systems  Constitutional: Negative for activity change.  HENT: Negative for congestion and rhinorrhea.   Respiratory: Negative for cough and shortness of breath.   Cardiovascular: Positive for chest pain.  Gastrointestinal: Positive for abdominal pain. Negative for diarrhea, nausea and vomiting.  Genitourinary: Negative for dysuria and hematuria.  Musculoskeletal: Positive for back pain.  Neurological: Negative for weakness and headaches.  Psychiatric/Behavioral: Negative for behavioral problems and confusion.       Objective:   Physical Exam  Constitutional: He appears well-developed.  HENT:  Head: Normocephalic.  Mouth/Throat: Oropharynx is clear and moist. No oropharyngeal exudate.  Neck: Normal range of motion.  Cardiovascular: Normal rate and normal heart sounds.  No murmur heard. Pulmonary/Chest: Effort normal and breath sounds normal. He has no wheezes.  Lymphadenopathy:    He has no cervical adenopathy.  Neurological: He exhibits normal muscle tone.  Skin: Skin is warm and dry.  Nursing note and vitals reviewed.   Stretching exercises recommended rotation bending        Assessment & Plan:  Pulmonary exam good More than likely musculoskeletal X-ray ordered Naproxen twice daily for the next 7 to 10 days If ongoing troubles notify us

## 2018-03-18 DIAGNOSIS — J309 Allergic rhinitis, unspecified: Secondary | ICD-10-CM | POA: Diagnosis not present

## 2018-03-24 ENCOUNTER — Inpatient Hospital Stay (HOSPITAL_COMMUNITY): Payer: Medicare Other | Attending: Hematology

## 2018-03-24 DIAGNOSIS — Z79899 Other long term (current) drug therapy: Secondary | ICD-10-CM | POA: Insufficient documentation

## 2018-03-24 DIAGNOSIS — E039 Hypothyroidism, unspecified: Secondary | ICD-10-CM | POA: Insufficient documentation

## 2018-03-24 DIAGNOSIS — D751 Secondary polycythemia: Secondary | ICD-10-CM

## 2018-03-24 DIAGNOSIS — I10 Essential (primary) hypertension: Secondary | ICD-10-CM | POA: Diagnosis not present

## 2018-03-24 DIAGNOSIS — D571 Sickle-cell disease without crisis: Secondary | ICD-10-CM | POA: Insufficient documentation

## 2018-03-24 LAB — COMPREHENSIVE METABOLIC PANEL
ALBUMIN: 4.1 g/dL (ref 3.5–5.0)
ALK PHOS: 63 U/L (ref 38–126)
ALT: 26 U/L (ref 0–44)
AST: 28 U/L (ref 15–41)
Anion gap: 6 (ref 5–15)
BILIRUBIN TOTAL: 1.3 mg/dL — AB (ref 0.3–1.2)
BUN: 10 mg/dL (ref 8–23)
CALCIUM: 8.8 mg/dL — AB (ref 8.9–10.3)
CO2: 30 mmol/L (ref 22–32)
CREATININE: 0.99 mg/dL (ref 0.61–1.24)
Chloride: 102 mmol/L (ref 98–111)
GFR calc Af Amer: >60 mL/min (ref >60)
GFR calc non Af Amer: >60 mL/min (ref >60)
GLUCOSE: 89 mg/dL (ref 70–99)
Potassium: 3.5 mmol/L (ref 3.5–5.1)
SODIUM: 138 mmol/L (ref 135–145)
Total Protein: 7.2 g/dL (ref 6.5–8.1)

## 2018-03-24 LAB — CBC WITH DIFFERENTIAL/PLATELET
ABS IMMATURE GRANULOCYTES: 0.01 10*3/uL (ref 0.00–0.07)
BASOS ABS: 0.1 10*3/uL (ref 0.0–0.1)
Basophils Relative: 1 %
Eosinophils Absolute: 0.2 10*3/uL (ref 0.0–0.5)
Eosinophils Relative: 4 %
HEMATOCRIT: 49.9 % (ref 39.0–52.0)
HEMOGLOBIN: 16.8 g/dL (ref 13.0–17.0)
Immature Granulocytes: 0 %
LYMPHS ABS: 1.2 10*3/uL (ref 0.7–4.0)
Lymphocytes Relative: 18 %
MCH: 32 pg (ref 26.0–34.0)
MCHC: 33.7 g/dL (ref 30.0–36.0)
MCV: 95 fL (ref 80.0–100.0)
Monocytes Absolute: 0.6 10*3/uL (ref 0.1–1.0)
Monocytes Relative: 9 %
NEUTROS ABS: 4.4 10*3/uL (ref 1.7–7.7)
NRBC: 0 % (ref 0.0–0.2)
Neutrophils Relative %: 68 %
Platelets: 169 10*3/uL (ref 150–400)
RBC: 5.25 MIL/uL (ref 4.22–5.81)
RDW: 13.2 % (ref 11.5–15.5)
WBC: 6.5 10*3/uL (ref 4.0–10.5)

## 2018-03-24 LAB — LACTATE DEHYDROGENASE: LDH: 158 U/L (ref 98–192)

## 2018-03-24 LAB — FERRITIN: Ferritin: 73 ng/mL (ref 24–336)

## 2018-03-26 ENCOUNTER — Ambulatory Visit (INDEPENDENT_AMBULATORY_CARE_PROVIDER_SITE_OTHER): Payer: Medicare Other | Admitting: Family Medicine

## 2018-03-26 ENCOUNTER — Encounter: Payer: Self-pay | Admitting: Family Medicine

## 2018-03-26 VITALS — BP 118/80 | Temp 98.8°F | Ht 68.0 in | Wt 175.0 lb

## 2018-03-26 DIAGNOSIS — J019 Acute sinusitis, unspecified: Secondary | ICD-10-CM

## 2018-03-26 DIAGNOSIS — B9689 Other specified bacterial agents as the cause of diseases classified elsewhere: Secondary | ICD-10-CM

## 2018-03-26 MED ORDER — DOXYCYCLINE HYCLATE 100 MG PO CAPS: 100.0000 mg | ORAL_CAPSULE | Freq: Two times a day (BID) | ORAL | 0 refills | Status: DC

## 2018-03-26 MED ORDER — PREDNISONE 20 MG PO TABS: ORAL_TABLET | ORAL | 0 refills | Status: DC

## 2018-03-26 NOTE — Progress Notes (Signed)
   Subjective:    Patient ID: Thomas [name], male    DOB: 07-Sep-1934, 83 y.o.   MRN: [id number]  Sinusitis  This is a new problem. Episode onset: one week. Associated symptoms include congestion, coughing and headaches. Pertinent negatives include no chills or ear pain. (Fever) Treatments tried: cough syrup, flonase, vit c.   Patient with history of COPD Relates a lot of coughing congestion bringing up some phlegm Also relates head congestion postnasal drainage Denies high fever chills sweats   Review of Systems  Constitutional: Negative for activity change, chills and fever.  HENT: Positive for congestion and rhinorrhea. Negative for ear pain.   Eyes: Negative for discharge.  Respiratory: Positive for cough. Negative for wheezing.   Cardiovascular: Negative for chest pain.  Gastrointestinal: Negative for nausea and vomiting.  Musculoskeletal: Negative for arthralgias.  Neurological: Positive for headaches.       Objective:   Physical Exam  Constitutional: He appears well-developed.  HENT:  Head: Normocephalic.  Mouth/Throat: Oropharynx is clear and moist. No oropharyngeal exudate.  Neck: Normal range of motion.  Cardiovascular: Normal rate, regular rhythm and normal heart sounds.  No murmur heard. Pulmonary/Chest: Effort normal and breath sounds normal. He has no wheezes.  Lymphadenopathy:    He has no cervical adenopathy.  Neurological: He exhibits normal muscle tone.  Skin: Skin is warm and dry.  Nursing note and vitals reviewed.  Patient coughed up deep yellow mucus while he was present along with a slightly wheezy cough but patient not in respiratory distress       Assessment & Plan:  Chronic asthma issues Acute rhinosinusitis Bronchial component Short course prednisone Antibiotics prescribed warning signs discussed X-ray lab work not indicated

## 2018-03-29 ENCOUNTER — Other Ambulatory Visit: Payer: Self-pay | Admitting: Family Medicine

## 2018-03-31 ENCOUNTER — Other Ambulatory Visit: Payer: Self-pay

## 2018-03-31 ENCOUNTER — Encounter (HOSPITAL_COMMUNITY): Payer: Self-pay | Admitting: Internal Medicine

## 2018-03-31 ENCOUNTER — Inpatient Hospital Stay (HOSPITAL_COMMUNITY): Payer: Medicare Other | Admitting: Internal Medicine

## 2018-03-31 VITALS — BP 124/70 | HR 105 | Temp 98.1°F | Resp 14 | Wt 175.5 lb

## 2018-03-31 DIAGNOSIS — D571 Sickle-cell disease without crisis: Secondary | ICD-10-CM | POA: Diagnosis not present

## 2018-03-31 DIAGNOSIS — Z79899 Other long term (current) drug therapy: Secondary | ICD-10-CM

## 2018-03-31 DIAGNOSIS — D751 Secondary polycythemia: Secondary | ICD-10-CM | POA: Diagnosis not present

## 2018-03-31 DIAGNOSIS — I10 Essential (primary) hypertension: Secondary | ICD-10-CM

## 2018-03-31 DIAGNOSIS — E039 Hypothyroidism, unspecified: Secondary | ICD-10-CM | POA: Diagnosis not present

## 2018-03-31 NOTE — Progress Notes (Signed)
Diagnosis Secondary polycythemia - Plan: CBC with Differential/Platelet, Comprehensive metabolic panel, Lactate dehydrogenase, Ferritin  Staging Cancer Staging No matching staging information was found for the patient.  Assessment and Plan:  1.  Secondary polycythemia:  -CALR/JAK2/MPL negative. Thought to possibly be secondary to sleep apnea. No h/o bone marrow biopsy.   -Last phlebotomy was done on 09/24/16 per records. Prior to that, therapeutic phlebotomy was done on 05/02/16.    Labs done 03/24/2018 reviewed and showed WBC 6.5 HB 16.8 HCT 49.9 plts 169,000.  Chemistries WNL with K+ 3.5 Cr 0.99 and normal LFTs.  Ferritin 73.   Counts are stable and HCT <50.  Pt remains on observation.  He has option of Baby ASA.  He will RTC in  09/2018  for follow-up and repeat labs.    2.  Hypothyroidism.  Pt is on Synthroid.  Follow-up with PCP for monitoring.    3.  HTN.  BP is 124/70.  Follow-up with PCP.    Current Status:  Pt is seen today for follow-up.  He is here to go over labs.    Problem List Patient Active Problem List   Diagnosis Date Noted  . PAC (premature atrial contraction) [I49.1] 01/11/2018  . Inguinal hernia [K40.90] 04/26/2016  . Abnormal CT of the abdomen [R93.5] 02/21/2016  . Hyperlipemia [E78.5] 09/14/2015  . Chest pain, rule out acute myocardial infarction [R07.9] 02/04/2015  . Chest pain [R07.9] 02/04/2015  . Polycythemia, secondary [D75.1] 09/11/2013  . Allergic rhinitis [J30.9] 09/11/2013  . Hypothyroidism [E03.9] 06/01/2013  . Reactive airway disease [J45.909] 08/29/2012  . Thyroid nodule [E04.1] 08/29/2012  . Chest pain, musculoskeletal [R07.89] 05/07/2011  . GERD (gastroesophageal reflux disease) [K21.9] 05/07/2011  . HTN (hypertension), benign [I10] 12/09/2009  . Essential hypertension [I10] 12/09/2009  . CORONARY ATHEROSCLEROSIS NATIVE CORONARY ARTERY [I25.10] 12/09/2009  . ABNORMAL ELECTROCARDIOGRAM [R94.31] 12/09/2009    Past Medical History Past Medical  History:  Diagnosis Date  . Arthritis    "my entire back is gone"  . Asthma   . GERD (gastroesophageal reflux disease)   . H/O hiatal hernia   . Hernia   . History of stress test 2011  . Hypertension   . Hypothyroidism   . Kidney stones   . Pollen allergies   . Polycythemia   . Polycythemia, secondary 09/11/2013  . Renal calculus    some removed by Cystoscopy, one passed spontaneously  . Sleep apnea    mild no cpap    Past Surgical History Past Surgical History:  Procedure Laterality Date  . APPENDECTOMY     50 years ago  . BACK SURGERY     L-4 , L-5  patient states that it was years ago.  Marland Kitchen COLONOSCOPY     at age 23  . COLONOSCOPY N/A 04/06/2016   Procedure: COLONOSCOPY;  Surgeon: Rogene Houston, MD;  Location: [facility];  Service: Endoscopy;  Laterality: N/A;  7:30  . EYE SURGERY     cataracts removed- bilateral- /w IOL  . INGUINAL HERNIA REPAIR Left 05/14/2016   Procedure: OPEN REPAIR OF SYMPTOMATIC (PAINFUL) LEFT INGUINAL HERNIA WITH MESH;  Surgeon: Vickie Epley, MD;  Location: [facility];  Service: General;  Laterality: Left;  . THYROIDECTOMY Right 09/17/2012   Procedure: RIGHT HEMI-THYROIDECTOMY;  Surgeon: Ascencion Dike, MD;  Location: [place];  Service: ENT;  Laterality: Right;  . UPPER GASTROINTESTINAL ENDOSCOPY     egd/ed  . YAG LASER APPLICATION Left 28/76/8115   Procedure: YAG LASER APPLICATION;  Surgeon: Rutherford Guys, MD;  Location: [facility];  Service: Ophthalmology;  Laterality: Left;    Family History Family History  Problem Relation Age of Onset  . Heart disease Mother   . Heart disease Father   . Heart disease Brother   . Healthy Son      Social History  reports that he has never smoked. His smokeless tobacco use includes chew. He reports that he does not drink alcohol or use drugs.  Medications  Current Outpatient Medications:  .  albuterol (PROVENTIL HFA;VENTOLIN HFA) 108 (90 Base) MCG/ACT inhaler, Inhale 2 puffs into the lungs every 4 (four)  hours as needed., Disp: 1 Inhaler, Rfl: 5 .  budesonide-formoterol (SYMBICORT) 80-4.5 MCG/ACT inhaler, Inhale 1 puff into the lungs 2 (two) times daily., Disp: , Rfl:  .  doxycycline (VIBRAMYCIN) 100 MG capsule, Take 1 capsule (100 mg total) by mouth 2 (two) times daily., Disp: 20 capsule, Rfl: 0 .  fluticasone (FLONASE) 50 MCG/ACT nasal spray, INHALE 1 SPRAY INTO EACH NOSTRIL TWICE DAILY., Disp: 16 g, Rfl: 5 .  fluticasone (FLONASE) 50 MCG/ACT nasal spray, INHALE 1 SPRAY INTO EACH NOSTRIL TWICE DAILY., Disp: 16 g, Rfl: 5 .  gabapentin (NEURONTIN) 400 MG capsule, Take 1 capsule (400 mg total) by mouth 3 (three) times daily., Disp: 90 capsule, Rfl: 12 .  Hypromellose (ARTIFICIAL TEARS OP), Place 1 drop into both eyes daily as needed (dry eyes). , Disp: , Rfl:  .  ibuprofen (ADVIL,MOTRIN) 200 MG tablet, Take 400 mg by mouth 2 (two) times daily as needed for headache or moderate pain., Disp: , Rfl:  .  ketoconazole (NIZORAL) 2 % cream, APPLY TO AFFECTED AREA TWO TIMES DAILY AS NEEDED FOR IRRITATION., Disp: 60 g, Rfl: 0 .  levothyroxine (SYNTHROID, LEVOTHROID) 100 MCG tablet, TAKE ONE TABLET BY MOUTH DAILY BEFORE BREAKFAST., Disp: 90 tablet, Rfl: 1 .  mometasone (ELOCON) 0.1 % cream, , Disp: , Rfl:  .  naproxen (NAPROSYN) 500 MG tablet, 1 bid prn rib pain, Disp: 30 tablet, Rfl: 0 .  omeprazole (PRILOSEC) 20 MG capsule, 1 qd, Disp: 90 capsule, Rfl: 1 .  pravastatin (PRAVACHOL) 40 MG tablet, TAKE ONE TABLET BY MOUTH AT BEDTIME., Disp: 90 tablet, Rfl: 0 .  predniSONE (DELTASONE) 20 MG tablet, 2 qd for 5 days, Disp: 10 tablet, Rfl: 0 .  triamcinolone cream (KENALOG) 0.1 %, Apply 1 application topically 2 (two) times daily. (Patient taking differently: Apply 1 application topically 2 (two) times daily as needed (rash). ), Disp: 45 g, Rfl: 2 .  valsartan-hydrochlorothiazide (DIOVAN-HCT) 80-12.5 MG tablet, TAKE (1) TABLET BY MOUTH ONCE DAILY., Disp: 90 tablet, Rfl: 0  Allergies Penicillins  Review of  Systems Review of Systems - Oncology ROS negative   Physical Exam  Vitals Wt Readings from Last 3 Encounters:  03/31/18 175 lb 8 oz (79.6 kg)  03/26/18 175 lb (79.4 kg)  03/11/18 172 lb 3.2 oz (78.1 kg)   Temp Readings from Last 3 Encounters:  03/31/18 98.1 F (36.7 C) (Oral)  03/26/18 98.8 F (37.1 C) (Oral)  03/11/18 98.4 F (36.9 C) (Oral)   BP Readings from Last 3 Encounters:  03/31/18 124/70  03/26/18 118/80  03/11/18 136/78   Pulse Readings from Last 3 Encounters:  03/31/18 (!) 105  10/23/17 (!) 105  05/01/17 (!) 55   Constitutional: Well-developed, well-nourished, and in no distress.   HENT: Head: Normocephalic and atraumatic.  Mouth/Throat: No oropharyngeal exudate. Mucosa moist. Eyes: Pupils are equal, round, and reactive to light.  Conjunctivae are normal. No scleral icterus.  Neck: Normal range of motion. Neck supple. No JVD present.  Cardiovascular: Normal rate, regular rhythm and normal heart sounds.  Exam reveals no gallop and no friction rub.   No murmur heard. Pulmonary/Chest: Effort normal and breath sounds normal. No respiratory distress. No wheezes.No rales.  Abdominal: Soft. Bowel sounds are normal. No distension. There is no tenderness. There is no guarding.  Musculoskeletal: No edema or tenderness.  Lymphadenopathy: No cervical, axillary or supraclavicular adenopathy.  Neurological: Alert and oriented to person, place, and time. No cranial nerve deficit.  Skin: Skin is warm and dry. No rash noted. No erythema. No pallor.  Psychiatric: Affect and judgment normal.   Labs No visits with results within 3 Day(s) from this visit.  Latest known visit with results is:  Appointment on 03/24/2018  Component Date Value Ref Range Status  . WBC 03/24/2018 6.5  4.0 - 10.5 K/uL Final  . RBC 03/24/2018 5.25  4.22 - 5.81 MIL/uL Final  . Hemoglobin 03/24/2018 16.8  13.0 - 17.0 g/dL Final  . HCT 03/24/2018 49.9  39.0 - 52.0 % Final  . MCV 03/24/2018 95.0  80.0  - 100.0 fL Final  . MCH 03/24/2018 32.0  26.0 - 34.0 pg Final  . MCHC 03/24/2018 33.7  30.0 - 36.0 g/dL Final  . RDW 03/24/2018 13.2  11.5 - 15.5 % Final  . Platelets 03/24/2018 169  150 - 400 K/uL Final  . nRBC 03/24/2018 0.0  0.0 - 0.2 % Final  . Neutrophils Relative % 03/24/2018 68  % Final  . Neutro Abs 03/24/2018 4.4  1.7 - 7.7 K/uL Final  . Lymphocytes Relative 03/24/2018 18  % Final  . Lymphs Abs 03/24/2018 1.2  0.7 - 4.0 K/uL Final  . Monocytes Relative 03/24/2018 9  % Final  . Monocytes Absolute 03/24/2018 0.6  0.1 - 1.0 K/uL Final  . Eosinophils Relative 03/24/2018 4  % Final  . Eosinophils Absolute 03/24/2018 0.2  0.0 - 0.5 K/uL Final  . Basophils Relative 03/24/2018 1  % Final  . Basophils Absolute 03/24/2018 0.1  0.0 - 0.1 K/uL Final  . Immature Granulocytes 03/24/2018 0  % Final  . Abs Immature Granulocytes 03/24/2018 0.01  0.00 - 0.07 K/uL Final   Performed at [facility], [address] [place], [place] [address]  . Sodium 03/24/2018 138  135 - 145 mmol/L Final  . Potassium 03/24/2018 3.5  3.5 - 5.1 mmol/L Final  . Chloride 03/24/2018 102  98 - 111 mmol/L Final  . CO2 03/24/2018 30  22 - 32 mmol/L Final  . Glucose, Bld 03/24/2018 89  70 - 99 mg/dL Final  . BUN 03/24/2018 10  8 - 23 mg/dL Final  . Creatinine, Ser 03/24/2018 0.99  0.61 - 1.24 mg/dL Final  . Calcium 03/24/2018 8.8* 8.9 - 10.3 mg/dL Final  . Total Protein 03/24/2018 7.2  6.5 - 8.1 g/dL Final  . Albumin 03/24/2018 4.1  3.5 - 5.0 g/dL Final  . AST 03/24/2018 28  15 - 41 U/L Final  . ALT 03/24/2018 26  0 - 44 U/L Final  . Alkaline Phosphatase 03/24/2018 63  38 - 126 U/L Final  . Total Bilirubin 03/24/2018 1.3* 0.3 - 1.2 mg/dL Final  . GFR calc non Af Amer 03/24/2018 >60  >60 mL/min Final  . GFR calc Af Amer 03/24/2018 >60  >60 mL/min Final  . Anion gap 03/24/2018 6  5 - 15 Final   Performed at [facility], [address] [place], [place] [address]  . LDH 03/24/2018 158  98 - 192 U/L Final   Performed  at [facility], [address] [place], [place] [address]  . Ferritin 03/24/2018 73  24 - 336 ng/mL Final   Performed at [facility], [address] [place], [place] [address]     Pathology Orders Placed This Encounter  Procedures  . CBC with Differential/Platelet    Standing Status:   Future    Standing Expiration Date:   03/31/2020  . Comprehensive metabolic panel    Standing Status:   Future    Standing Expiration Date:   03/31/2020  . Lactate dehydrogenase    Standing Status:   Future    Standing Expiration Date:   03/31/2020  . Ferritin    Standing Status:   Future    Standing Expiration Date:   03/31/2020       Zoila Shutter MD

## 2018-03-31 NOTE — Patient Instructions (Signed)
[place] at [facility]  Discharge Instructions:  You saw Dr. Walden Field today. _______________________________________________________________  Thank you for choosing [place] at [facility] to provide your oncology and hematology care.  To afford each patient quality time with our providers, please arrive at least 15 minutes before your scheduled appointment.  You need to re-schedule your appointment if you arrive 10 or more minutes late.  We strive to give you quality time with our providers, and arriving late affects you and other patients whose appointments are after yours.  Also, if you no show three or more times for appointments you may be dismissed from the clinic.  Again, thank you for choosing [place] at [place] hope is that these requests will allow you access to exceptional care and in a timely manner. _______________________________________________________________  If you have questions after your visit, please contact our office at [phone] between the hours of 8:30 a.m. and 5:00 p.m. Voicemails left after 4:30 p.m. will not be returned until the following business day. _______________________________________________________________  For prescription refill requests, have your pharmacy contact our office. _______________________________________________________________  Recommendations made by the consultant and any test results will be sent to your referring physician. _______________________________________________________________

## 2018-04-03 DIAGNOSIS — J309 Allergic rhinitis, unspecified: Secondary | ICD-10-CM | POA: Diagnosis not present

## 2018-04-17 DIAGNOSIS — J309 Allergic rhinitis, unspecified: Secondary | ICD-10-CM | POA: Diagnosis not present

## 2018-04-29 DIAGNOSIS — J309 Allergic rhinitis, unspecified: Secondary | ICD-10-CM | POA: Diagnosis not present

## 2018-05-13 DIAGNOSIS — J309 Allergic rhinitis, unspecified: Secondary | ICD-10-CM | POA: Diagnosis not present

## 2018-05-16 ENCOUNTER — Other Ambulatory Visit (HOSPITAL_COMMUNITY)
Admission: RE | Admit: 2018-05-16 | Discharge: 2018-05-16 | Disposition: A | Discharge status: Home / Self Care | Payer: Medicare Other | Source: Ambulatory Visit | Attending: Family Medicine | Admitting: Family Medicine

## 2018-05-16 DIAGNOSIS — I10 Essential (primary) hypertension: Secondary | ICD-10-CM | POA: Insufficient documentation

## 2018-05-16 LAB — LIPID PANEL
Cholesterol: 166 mg/dL (ref 0–200)
HDL: 42 mg/dL (ref >40)
LDL Cholesterol: 76 mg/dL (ref 0–99)
Total CHOL/HDL Ratio: 4 RATIO
Triglycerides: 238 mg/dL — ABNORMAL HIGH (ref <150)
VLDL: 48 mg/dL — ABNORMAL HIGH (ref 0–40)

## 2018-05-16 LAB — BASIC METABOLIC PANEL
Anion gap: 8 (ref 5–15)
BUN: 15 mg/dL (ref 8–23)
CO2: 28 mmol/L (ref 22–32)
Calcium: 8.9 mg/dL (ref 8.9–10.3)
Chloride: 101 mmol/L (ref 98–111)
Creatinine, Ser: 1.05 mg/dL (ref 0.61–1.24)
GFR calc Af Amer: >60 mL/min (ref >60)
GFR calc non Af Amer: >60 mL/min (ref >60)
Glucose, Bld: 84 mg/dL (ref 70–99)
Potassium: 3.4 mmol/L — ABNORMAL LOW (ref 3.5–5.1)
Sodium: 137 mmol/L (ref 135–145)

## 2018-05-18 ENCOUNTER — Encounter: Payer: Self-pay | Admitting: Family Medicine

## 2018-05-24 ENCOUNTER — Other Ambulatory Visit: Payer: Self-pay | Admitting: Family Medicine

## 2018-05-27 DIAGNOSIS — J309 Allergic rhinitis, unspecified: Secondary | ICD-10-CM | POA: Diagnosis not present

## 2018-06-10 DIAGNOSIS — J309 Allergic rhinitis, unspecified: Secondary | ICD-10-CM | POA: Diagnosis not present

## 2018-06-11 ENCOUNTER — Ambulatory Visit (INDEPENDENT_AMBULATORY_CARE_PROVIDER_SITE_OTHER): Payer: Medicare Other | Admitting: Family Medicine

## 2018-06-11 ENCOUNTER — Encounter: Payer: Self-pay | Admitting: Family Medicine

## 2018-06-11 VITALS — Temp 97.9°F | Wt 178.2 lb

## 2018-06-11 DIAGNOSIS — J019 Acute sinusitis, unspecified: Secondary | ICD-10-CM | POA: Diagnosis not present

## 2018-06-11 MED ORDER — DOXYCYCLINE HYCLATE 100 MG PO TABS: 100.0000 mg | ORAL_TABLET | Freq: Two times a day (BID) | ORAL | 0 refills | Status: DC

## 2018-06-11 NOTE — Progress Notes (Signed)
   Subjective:    Patient ID: Thomas [name], male    DOB: September 16, 1934, 84 y.o.   MRN: [id number]  HPI Pt here today for head congestion, sinus congestion, and cough. Coughing up green phelgm. Also having wheezing, runny nose, headache. Going on for about a week. No treatment tried at this time. No fever.   Still taking symbicort everyday, used pro-air this morning and was helpful in relieving his symptoms of wheezing.   Review of Systems  Constitutional: Negative for chills and fever.  HENT: Positive for congestion and sinus pressure. Negative for ear pain.   Eyes: Negative for discharge.  Respiratory: Positive for cough and wheezing. Negative for shortness of breath.   Gastrointestinal: Negative for diarrhea, nausea and vomiting.  Musculoskeletal: Negative for myalgias.       Objective:   Physical Exam Vitals signs and nursing note reviewed.  Constitutional:      General: He is not in acute distress.    Appearance: Normal appearance. He is not toxic-appearing.  HENT:     Head: Normocephalic and atraumatic.     Right Ear: Tympanic membrane normal.     Left Ear: Tympanic membrane normal.     Nose: Congestion present.     Mouth/Throat:     Mouth: Mucous membranes are moist.     Pharynx: Oropharynx is clear.  Eyes:     General:        Right eye: No discharge.        Left eye: No discharge.  Neck:     Musculoskeletal: Neck supple. No neck rigidity.  Cardiovascular:     Rate and Rhythm: Normal rate and regular rhythm.     Heart sounds: Normal heart sounds.  Pulmonary:     Effort: Pulmonary effort is normal. No respiratory distress.     Breath sounds: Normal breath sounds. No wheezing or rales.  Lymphadenopathy:     Cervical: No cervical adenopathy.  Skin:    General: Skin is warm and dry.  Neurological:     Mental Status: He is alert and oriented to person, place, and time.  Psychiatric:        Behavior: Behavior normal.           Assessment & Plan:  Acute  rhinosinusitis  Discussed likely post-viral etiology, will go ahead and treat with doxycycline x 10 days. No wheezing noted on exam today, lung sounds clear, do not feel steroids are necessary at this time. Recommend continued use of symbicort as prescribed, and use his pro-air inhaler prn. Instructed if he develops more shortness of breath or worsening wheeze where he is needing to use his albuterol more frequently he should notify the office. Symptomatic care discussed. Warning signs discussed. F/u if symptoms worsen or fail to improve.

## 2018-06-21 ENCOUNTER — Other Ambulatory Visit: Payer: Self-pay | Admitting: Family Medicine

## 2018-06-24 DIAGNOSIS — J309 Allergic rhinitis, unspecified: Secondary | ICD-10-CM | POA: Diagnosis not present

## 2018-07-10 ENCOUNTER — Ambulatory Visit: Payer: Medicare Other | Admitting: Family Medicine

## 2018-07-11 ENCOUNTER — Ambulatory Visit: Payer: Medicare Other | Admitting: Family Medicine

## 2018-07-31 DIAGNOSIS — J309 Allergic rhinitis, unspecified: Secondary | ICD-10-CM | POA: Diagnosis not present

## 2018-08-26 DIAGNOSIS — J309 Allergic rhinitis, unspecified: Secondary | ICD-10-CM | POA: Diagnosis not present

## 2018-09-03 ENCOUNTER — Other Ambulatory Visit: Payer: Self-pay

## 2018-09-03 ENCOUNTER — Ambulatory Visit (INDEPENDENT_AMBULATORY_CARE_PROVIDER_SITE_OTHER): Payer: Medicare Other | Admitting: Family Medicine

## 2018-09-03 ENCOUNTER — Encounter: Payer: Self-pay | Admitting: Family Medicine

## 2018-09-03 VITALS — BP 148/84

## 2018-09-03 DIAGNOSIS — E038 Other specified hypothyroidism: Secondary | ICD-10-CM | POA: Diagnosis not present

## 2018-09-03 DIAGNOSIS — E7849 Other hyperlipidemia: Secondary | ICD-10-CM | POA: Diagnosis not present

## 2018-09-03 DIAGNOSIS — I10 Essential (primary) hypertension: Secondary | ICD-10-CM | POA: Diagnosis not present

## 2018-09-03 NOTE — Progress Notes (Signed)
   Subjective:  tele telephone visit only video not available to the patient  Patient ID: Thomas [name], male    DOB: Sep 24, 1934, 84 y.o.   MRN: [id number]  Hypertension  This is a chronic problem. The current episode started more than 1 year ago. Pertinent negatives include no chest pain, headaches or shortness of breath. Risk factors for coronary artery disease include dyslipidemia and male gender. Treatments tried: valsartan/hctz 80/12.5. There are no compliance problems.   He is staying active trying to avoid coronavirus does take his medicines regular basis try and eat healthy denies any chest tightness pressure pain shortness of breath BP today: 148/84  Virtual Visit via Video Note  I connected with Thomas [name] on 09/03/18 at  9:30 AM EDT by a video enabled telemedicine application and verified that I am speaking with the correct person using two identifiers.  Location: Patient: home Provider: office   I discussed the limitations of evaluation and management by telemedicine and the availability of in person appointments. The patient expressed understanding and agreed to proceed.  History of Present Illness:    Observations/Objective:   Assessment and Plan:   Follow Up Instructions:    I discussed the assessment and treatment plan with the patient. The patient was provided an opportunity to ask questions and all were answered. The patient agreed with the plan and demonstrated an understanding of the instructions.   The patient was advised to call back or seek an in-person evaluation if the symptoms worsen or if the condition fails to improve as anticipated.  I provided 15 minutes of non-face-to-face time during this encounter.      Review of Systems  Constitutional: Negative for activity change, fatigue and fever.  HENT: Negative for congestion and rhinorrhea.   Respiratory: Negative for cough and shortness of breath.   Cardiovascular: Negative for chest pain  and leg swelling.  Gastrointestinal: Negative for abdominal pain, diarrhea and nausea.  Genitourinary: Negative for dysuria and hematuria.  Neurological: Negative for weakness and headaches.  Psychiatric/Behavioral: Negative for agitation and behavioral problems.       Objective:   Physical Exam  Unable to do physical exam via telephone visit only      Assessment & Plan:  Blood pressure within reasonable range for his age I would not recommend increasing the dose based upon this continue current medications.  Lab work ordered for later in the fall with patient to do a follow-up visit in September follow-up sooner problems.  We did discuss avoidance of coronavirus

## 2018-09-09 DIAGNOSIS — J309 Allergic rhinitis, unspecified: Secondary | ICD-10-CM | POA: Diagnosis not present

## 2018-09-10 ENCOUNTER — Other Ambulatory Visit: Payer: Self-pay | Admitting: *Deleted

## 2018-09-10 DIAGNOSIS — I10 Essential (primary) hypertension: Secondary | ICD-10-CM

## 2018-09-10 DIAGNOSIS — E7849 Other hyperlipidemia: Secondary | ICD-10-CM

## 2018-09-10 DIAGNOSIS — E038 Other specified hypothyroidism: Secondary | ICD-10-CM

## 2018-09-15 ENCOUNTER — Other Ambulatory Visit: Payer: Self-pay | Admitting: Family Medicine

## 2018-09-16 NOTE — Telephone Encounter (Signed)
6 refills each Please Verify with patient he is still taking gabapentin thank you

## 2018-09-17 NOTE — Telephone Encounter (Signed)
Yes he is still taking Gabapentin one three times per day.

## 2018-09-17 NOTE — Telephone Encounter (Signed)
I called left a message to r/c. 

## 2018-09-17 NOTE — Telephone Encounter (Signed)
May refill all of these x6

## 2018-09-23 DIAGNOSIS — J309 Allergic rhinitis, unspecified: Secondary | ICD-10-CM | POA: Diagnosis not present

## 2018-09-29 DIAGNOSIS — J301 Allergic rhinitis due to pollen: Secondary | ICD-10-CM | POA: Diagnosis not present

## 2018-09-29 DIAGNOSIS — J3089 Other allergic rhinitis: Secondary | ICD-10-CM | POA: Diagnosis not present

## 2018-10-01 ENCOUNTER — Other Ambulatory Visit: Payer: Self-pay

## 2018-10-01 ENCOUNTER — Inpatient Hospital Stay (HOSPITAL_COMMUNITY): Payer: Medicare Other | Attending: Hematology

## 2018-10-01 DIAGNOSIS — D751 Secondary polycythemia: Secondary | ICD-10-CM | POA: Diagnosis not present

## 2018-10-01 LAB — CBC WITH DIFFERENTIAL/PLATELET
Abs Immature Granulocytes: 0.01 10*3/uL (ref 0.00–0.07)
Basophils Absolute: 0 10*3/uL (ref 0.0–0.1)
Basophils Relative: 1 %
Eosinophils Absolute: 0.2 10*3/uL (ref 0.0–0.5)
Eosinophils Relative: 4 %
HCT: 50.4 % (ref 39.0–52.0)
Hemoglobin: 17.4 g/dL — ABNORMAL HIGH (ref 13.0–17.0)
Immature Granulocytes: 0 %
Lymphocytes Relative: 24 %
Lymphs Abs: 1.3 10*3/uL (ref 0.7–4.0)
MCH: 32.7 pg (ref 26.0–34.0)
MCHC: 34.5 g/dL (ref 30.0–36.0)
MCV: 94.7 fL (ref 80.0–100.0)
Monocytes Absolute: 0.4 10*3/uL (ref 0.1–1.0)
Monocytes Relative: 8 %
Neutro Abs: 3.3 10*3/uL (ref 1.7–7.7)
Neutrophils Relative %: 63 %
Platelets: 154 10*3/uL (ref 150–400)
RBC: 5.32 MIL/uL (ref 4.22–5.81)
RDW: 13.2 % (ref 11.5–15.5)
WBC: 5.3 10*3/uL (ref 4.0–10.5)
nRBC: 0 % (ref 0.0–0.2)

## 2018-10-01 LAB — COMPREHENSIVE METABOLIC PANEL
ALT: 26 U/L (ref 0–44)
AST: 24 U/L (ref 15–41)
Albumin: 4 g/dL (ref 3.5–5.0)
Alkaline Phosphatase: 55 U/L (ref 38–126)
Anion gap: 9 (ref 5–15)
BUN: 10 mg/dL (ref 8–23)
CO2: 30 mmol/L (ref 22–32)
Calcium: 9.2 mg/dL (ref 8.9–10.3)
Chloride: 103 mmol/L (ref 98–111)
Creatinine, Ser: 1.02 mg/dL (ref 0.61–1.24)
GFR calc Af Amer: >60 mL/min (ref >60)
GFR calc non Af Amer: >60 mL/min (ref >60)
Glucose, Bld: 99 mg/dL (ref 70–99)
Potassium: 4 mmol/L (ref 3.5–5.1)
Sodium: 142 mmol/L (ref 135–145)
Total Bilirubin: 1.6 mg/dL — ABNORMAL HIGH (ref 0.3–1.2)
Total Protein: 6.7 g/dL (ref 6.5–8.1)

## 2018-10-01 LAB — LACTATE DEHYDROGENASE: LDH: 142 U/L (ref 98–192)

## 2018-10-01 LAB — FERRITIN: Ferritin: 70 ng/mL (ref 24–336)

## 2018-10-01 NOTE — Progress Notes (Signed)
For review.  Please update ordering provider

## 2018-10-02 ENCOUNTER — Inpatient Hospital Stay (HOSPITAL_COMMUNITY): Payer: Medicare Other | Admitting: Hematology

## 2018-10-02 ENCOUNTER — Encounter (HOSPITAL_COMMUNITY): Payer: Self-pay | Admitting: Hematology

## 2018-10-02 DIAGNOSIS — D751 Secondary polycythemia: Secondary | ICD-10-CM

## 2018-10-02 NOTE — Progress Notes (Signed)
[facility] [address] [place] [address]   CLINIC:  Medical Oncology/Hematology  PCP:  [name], MD [address] [place] [address] [phone]   REASON FOR VISIT:  Follow-up for secondary erythrocytosis     INTERVAL HISTORY:  Mr. Sackrider 84 y.o. male seen for follow-up of secondary throat cytosis.  He denies any aquagenic pruritus.  Denies any vasomotor symptoms.  Denies nausea vomiting diarrhea or constipation.  Denies any bleeding per rectum or melena.  Appetite and energy levels are 100%.  Denies any ER visits or hospitalizations.    REVIEW OF SYSTEMS:  Review of Systems  All other systems reviewed and are negative.    PAST MEDICAL/SURGICAL HISTORY:  Past Medical History:  Diagnosis Date  . Arthritis    "my entire back is gone"  . Asthma   . GERD (gastroesophageal reflux disease)   . H/O hiatal hernia   . Hernia   . History of stress test 2011  . Hypertension   . Hypothyroidism   . Kidney stones   . Pollen allergies   . Polycythemia   . Polycythemia, secondary 09/11/2013  . Renal calculus    some removed by Cystoscopy, one passed spontaneously  . Sleep apnea    mild no cpap   Past Surgical History:  Procedure Laterality Date  . APPENDECTOMY     50 years ago  . BACK SURGERY     L-4 , L-5  patient states that it was years ago.  Marland Kitchen COLONOSCOPY     at age 75  . COLONOSCOPY N/A 04/06/2016   Procedure: COLONOSCOPY;  Surgeon: Rogene Houston, MD;  Location: [facility];  Service: Endoscopy;  Laterality: N/A;  7:30  . EYE SURGERY     cataracts removed- bilateral- /w IOL  . INGUINAL HERNIA REPAIR Left 05/14/2016   Procedure: OPEN REPAIR OF SYMPTOMATIC (PAINFUL) LEFT INGUINAL HERNIA WITH MESH;  Surgeon: Vickie Epley, MD;  Location: [facility];  Service: General;  Laterality: Left;  . THYROIDECTOMY Right 09/17/2012   Procedure: RIGHT HEMI-THYROIDECTOMY;  Surgeon: Ascencion Dike, MD;  Location: [facility];  Service: ENT;   Laterality: Right;  . UPPER GASTROINTESTINAL ENDOSCOPY     egd/ed  . YAG LASER APPLICATION Left 26/71/2458   Procedure: YAG LASER APPLICATION;  Surgeon: Rutherford Guys, MD;  Location: [facility];  Service: Ophthalmology;  Laterality: Left;     SOCIAL HISTORY:  Social History   Socioeconomic History  . Marital status: Widowed    Spouse name: Not on file  . Number of children: Not on file  . Years of education: Not on file  . Highest education level: Not on file  Occupational History  . Not on file  Social Needs  . Financial resource strain: Not on file  . Food insecurity    Worry: Not on file    Inability: Not on file  . Transportation needs    Medical: Not on file    Non-medical: Not on file  Tobacco Use  . Smoking status: Never Smoker  . Smokeless tobacco: Current User    Types: Chew  . Tobacco comment: Patient states that he has been chewing for 60 plus years  Substance and Sexual Activity  . Alcohol use: No    Comment: Patient states that he drinks a bout a case of beer a year  . Drug use: No  . Sexual activity: Never  Lifestyle  . Physical activity  Days per week: Not on file    Minutes per session: Not on file  . Stress: Not on file  Relationships  . Social Herbalist on phone: Not on file    Gets together: Not on file    Attends religious service: Not on file    Active member of club or organization: Not on file    Attends meetings of clubs or organizations: Not on file    Relationship status: Not on file  . Intimate partner violence    Fear of current or ex partner: Not on file    Emotionally abused: Not on file    Physically abused: Not on file    Forced sexual activity: Not on file  Other Topics Concern  . Not on file  Social History Narrative  . Not on file    FAMILY HISTORY:  Family History  Problem Relation Age of Onset  . Heart disease Mother   . Heart disease Father   . Heart disease Brother   . Healthy Son     CURRENT  MEDICATIONS:  Outpatient Encounter Medications as of 10/02/2018  Medication Sig  . albuterol (PROVENTIL HFA;VENTOLIN HFA) 108 (90 Base) MCG/ACT inhaler Inhale 2 puffs into the lungs every 4 (four) hours as needed.  . budesonide-formoterol (SYMBICORT) 80-4.5 MCG/ACT inhaler Inhale 1 puff into the lungs 2 (two) times daily.  . fluticasone (FLONASE) 50 MCG/ACT nasal spray INHALE 1 SPRAY INTO EACH NOSTRIL TWICE DAILY.  Marland Kitchen gabapentin (NEURONTIN) 400 MG capsule TAKE 1 CAPSULE BY MOUTH THREE TIMES DAILY.  Marland Kitchen Hypromellose (ARTIFICIAL TEARS OP) Place 1 drop into both eyes daily as needed (dry eyes).   Marland Kitchen ibuprofen (ADVIL,MOTRIN) 200 MG tablet Take 400 mg by mouth 2 (two) times daily as needed for headache or moderate pain.  Marland Kitchen ketoconazole (NIZORAL) 2 % cream APPLY TO AFFECTED AREA TWO TIMES DAILY AS NEEDED FOR IRRITATION.  Marland Kitchen levothyroxine (SYNTHROID) 100 MCG tablet TAKE ONE TABLET BY MOUTH DAILY BEFORE BREAKFAST.  . mometasone (ELOCON) 0.1 % cream   . naproxen (NAPROSYN) 500 MG tablet 1 bid prn rib pain  . omeprazole (PRILOSEC) 20 MG capsule 1 qd  . pravastatin (PRAVACHOL) 40 MG tablet TAKE ONE TABLET BY MOUTH AT BEDTIME.  Marland Kitchen triamcinolone cream (KENALOG) 0.1 % Apply 1 application topically 2 (two) times daily. (Patient taking differently: Apply 1 application topically 2 (two) times daily as needed (rash). )  . valsartan-hydrochlorothiazide (DIOVAN-HCT) 80-12.5 MG tablet TAKE (1) TABLET BY MOUTH ONCE DAILY.   No facility-administered encounter medications on file as of 10/02/2018.     ALLERGIES:  Allergies  Allergen Reactions  . Penicillins Other (See Comments)    Reaction many years ago-unknown reaction Has patient had a PCN reaction causing immediate rash, facial/tongue/throat swelling, SOB or lightheadedness with hypotension: Unknown Has patient had a PCN reaction causing severe rash involving mucus membranes or skin necrosis: Unknown Has patient had a PCN reaction that required hospitalization  Unknown Has patient had a PCN reaction occurring within the last 10 years: No If all of the above answers are "NO", then may proceed with Cephalosporin use.      PHYSICAL EXAM:  ECOG Performance status: 1  Vitals:   10/02/18 1432  BP: 132/74  Pulse: 92  Resp: 18  Temp: 98.1 F (36.7 C)  SpO2: 99%   Filed Weights   10/02/18 1432  Weight: 178 lb 14.4 oz (81.1 kg)    Physical Exam Vitals signs reviewed.  Constitutional:  Appearance: Normal appearance.  Cardiovascular:     Rate and Rhythm: Normal rate and regular rhythm.     Heart sounds: Normal heart sounds.  Pulmonary:     Effort: Pulmonary effort is normal.     Breath sounds: Normal breath sounds.  Abdominal:     General: There is no distension.     Palpations: Abdomen is soft. There is no mass.  Musculoskeletal:        General: No swelling.  Skin:    General: Skin is warm.  Neurological:     General: No focal deficit present.     Mental Status: He is alert and oriented to person, place, and time.  Psychiatric:        Mood and Affect: Mood normal.        Behavior: Behavior normal.      LABORATORY DATA:  I have reviewed the labs as listed.  CBC    Component Value Date/Time   WBC 5.3 10/01/2018 1104   RBC 5.32 10/01/2018 1104   HGB 17.4 (H) 10/01/2018 1104   HGB 18.1 (H) 09/12/2016 0816   HCT 50.4 10/01/2018 1104   HCT 52.0 (H) 09/12/2016 0816   PLT 154 10/01/2018 1104   PLT 172 09/12/2016 0816   MCV 94.7 10/01/2018 1104   MCV 93 09/12/2016 0816   MCH 32.7 10/01/2018 1104   MCHC 34.5 10/01/2018 1104   RDW 13.2 10/01/2018 1104   RDW 14.1 09/12/2016 0816   LYMPHSABS 1.3 10/01/2018 1104   LYMPHSABS 1.3 09/12/2016 0816   MONOABS 0.4 10/01/2018 1104   EOSABS 0.2 10/01/2018 1104   EOSABS 0.2 09/12/2016 0816   BASOSABS 0.0 10/01/2018 1104   BASOSABS 0.0 09/12/2016 0816   CMP Latest Ref Rng & Units 10/01/2018 05/16/2018 03/24/2018  Glucose 70 - 99 mg/dL 99 84 89  BUN 8 - 23 mg/dL 10 15 10    Creatinine 0.61 - 1.24 mg/dL 1.02 1.05 0.99  Sodium 135 - 145 mmol/L 142 137 138  Potassium 3.5 - 5.1 mmol/L 4.0 3.4(L) 3.5  Chloride 98 - 111 mmol/L 103 101 102  CO2 22 - 32 mmol/L 30 28 30   Calcium 8.9 - 10.3 mg/dL 9.2 8.9 8.8(L)  Total Protein 6.5 - 8.1 g/dL 6.7 - 7.2  Total Bilirubin 0.3 - 1.2 mg/dL 1.6(H) - 1.3(H)  Alkaline Phos 38 - 126 U/L 55 - 63  AST 15 - 41 U/L 24 - 28  ALT 0 - 44 U/L 26 - 26       DIAGNOSTIC IMAGING:  I have independently reviewed the scans and discussed with the patient.   I have reviewed Venita Lick LPN's note and agree with the documentation.  I personally performed a face-to-face visit, made revisions and my assessment and plan is as follows.    ASSESSMENT & PLAN:   Polycythemia, secondary 1.  Secondary erythrocytosis: - Myeloproliferative disorder work-up was negative for Jak 2 and other mutations. -Last phlebotomy was in June 2018. -He does not have any aquagenic pruritus, erythromelalgia, other vasomotor symptoms. - We reviewed his blood work.  Hemoglobin is minimally elevated at 17.4.  Platelet count and white count was normal. -I have reviewed his medications.  He is on hydrochlorothiazide.  This could be causing volume contraction and elevated hematocrit.  No intervention needed at this time. -He will come back in 6 months for follow-up.      Orders placed this encounter:  No orders of the defined types were placed in this encounter.  Derek Jack, MD [facility] [phone]

## 2018-10-02 NOTE — Assessment & Plan Note (Signed)
1.  Secondary erythrocytosis: - Myeloproliferative disorder work-up was negative for Jak 2 and other mutations. -Last phlebotomy was in June 2018. -He does not have any aquagenic pruritus, erythromelalgia, other vasomotor symptoms. - We reviewed his blood work.  Hemoglobin is minimally elevated at 17.4.  Platelet count and white count was normal. -I have reviewed his medications.  He is on hydrochlorothiazide.  This could be causing volume contraction and elevated hematocrit.  No intervention needed at this time. -He will come back in 6 months for follow-up.

## 2018-10-02 NOTE — Patient Instructions (Addendum)
[facility] at [facility] Discharge Instructions  You were seen today by Dr. Katragadda. He went over your recent lab results. He will see you back in 6 months for labs and follow up.   Thank you for choosing [facility] at [facility] to provide your oncology and hematology care.  To afford each patient quality time with our provider, please arrive at least 15 minutes before your scheduled appointment time.   If you have a lab appointment with the Cancer Center please come in thru the  Main Entrance and check in at the main information desk  You need to re-schedule your appointment should you arrive 10 or more minutes late.  We strive to give you quality time with our providers, and arriving late affects you and other patients whose appointments are after yours.  Also, if you no show three or more times for appointments you may be dismissed from the clinic at the providers discretion.     Again, thank you for choosing [facility].  Our hope is that these requests will decrease the amount of time that you wait before being seen by our physicians.       _____________________________________________________________  Should you have questions after your visit to [facility], please contact our office at [phone] between the hours of 8:00 a.m. and 4:30 p.m.  Voicemails left after 4:00 p.m. will not be returned until the following business day.  For prescription refill requests, have your pharmacy contact our office and allow 72 hours.    Cancer Center Support Programs:   > Cancer Support Group  2nd Tuesday of the month 1pm-2pm, Journey Room    

## 2018-10-21 DIAGNOSIS — J309 Allergic rhinitis, unspecified: Secondary | ICD-10-CM | POA: Diagnosis not present

## 2018-10-31 ENCOUNTER — Other Ambulatory Visit: Payer: Self-pay | Admitting: Family Medicine

## 2018-11-04 DIAGNOSIS — J309 Allergic rhinitis, unspecified: Secondary | ICD-10-CM | POA: Diagnosis not present

## 2018-11-17 ENCOUNTER — Other Ambulatory Visit: Payer: Self-pay | Admitting: Family Medicine

## 2018-11-18 DIAGNOSIS — J309 Allergic rhinitis, unspecified: Secondary | ICD-10-CM | POA: Diagnosis not present

## 2018-11-25 ENCOUNTER — Other Ambulatory Visit (HOSPITAL_COMMUNITY)
Admission: RE | Admit: 2018-11-25 | Discharge: 2018-11-25 | Disposition: A | Discharge status: Home / Self Care | Payer: Medicare Other | Source: Ambulatory Visit | Attending: Family Medicine | Admitting: Family Medicine

## 2018-11-25 ENCOUNTER — Telehealth: Payer: Self-pay | Admitting: Family Medicine

## 2018-11-25 DIAGNOSIS — E7849 Other hyperlipidemia: Secondary | ICD-10-CM | POA: Insufficient documentation

## 2018-11-25 DIAGNOSIS — I10 Essential (primary) hypertension: Secondary | ICD-10-CM | POA: Diagnosis not present

## 2018-11-25 LAB — BASIC METABOLIC PANEL
Anion gap: 9 (ref 5–15)
BUN: 14 mg/dL (ref 8–23)
CO2: 29 mmol/L (ref 22–32)
Calcium: 9.3 mg/dL (ref 8.9–10.3)
Chloride: 104 mmol/L (ref 98–111)
Creatinine, Ser: 1.19 mg/dL (ref 0.61–1.24)
GFR calc Af Amer: >60 mL/min (ref >60)
GFR calc non Af Amer: 57 mL/min — ABNORMAL LOW (ref >60)
Glucose, Bld: 95 mg/dL (ref 70–99)
Potassium: 4.1 mmol/L (ref 3.5–5.1)
Sodium: 142 mmol/L (ref 135–145)

## 2018-11-25 LAB — LIPID PANEL
Cholesterol: 143 mg/dL (ref 0–200)
HDL: 34 mg/dL — ABNORMAL LOW (ref >40)
LDL Cholesterol: 76 mg/dL (ref 0–99)
Total CHOL/HDL Ratio: 4.2 RATIO
Triglycerides: 165 mg/dL — ABNORMAL HIGH (ref <150)
VLDL: 33 mg/dL (ref 0–40)

## 2018-11-25 LAB — HEPATIC FUNCTION PANEL
ALT: 28 U/L (ref 0–44)
AST: 27 U/L (ref 15–41)
Albumin: 4.4 g/dL (ref 3.5–5.0)
Alkaline Phosphatase: 51 U/L (ref 38–126)
Bilirubin, Direct: 0.2 mg/dL (ref 0.0–0.2)
Indirect Bilirubin: 1.1 mg/dL — ABNORMAL HIGH (ref 0.3–0.9)
Total Bilirubin: 1.3 mg/dL — ABNORMAL HIGH (ref 0.3–1.2)
Total Protein: 7.5 g/dL (ref 6.5–8.1)

## 2018-11-25 LAB — TSH: TSH: 0.627 u[IU]/mL (ref 0.350–4.500)

## 2018-11-25 NOTE — Telephone Encounter (Signed)
error 

## 2018-11-26 ENCOUNTER — Encounter: Payer: Self-pay | Admitting: Family Medicine

## 2018-12-02 DIAGNOSIS — J309 Allergic rhinitis, unspecified: Secondary | ICD-10-CM | POA: Diagnosis not present

## 2018-12-04 DIAGNOSIS — J3089 Other allergic rhinitis: Secondary | ICD-10-CM | POA: Diagnosis not present

## 2018-12-04 DIAGNOSIS — K219 Gastro-esophageal reflux disease without esophagitis: Secondary | ICD-10-CM | POA: Diagnosis not present

## 2018-12-04 DIAGNOSIS — J4540 Moderate persistent asthma, uncomplicated: Secondary | ICD-10-CM | POA: Diagnosis not present

## 2018-12-17 ENCOUNTER — Encounter: Payer: Medicare Other | Admitting: Family Medicine

## 2018-12-18 DIAGNOSIS — J309 Allergic rhinitis, unspecified: Secondary | ICD-10-CM | POA: Diagnosis not present

## 2018-12-19 ENCOUNTER — Other Ambulatory Visit: Payer: Self-pay

## 2018-12-19 ENCOUNTER — Ambulatory Visit (INDEPENDENT_AMBULATORY_CARE_PROVIDER_SITE_OTHER): Payer: Medicare Other | Admitting: Family Medicine

## 2018-12-19 VITALS — BP 112/70 | Temp 97.6°F | Wt 176.0 lb

## 2018-12-19 DIAGNOSIS — J301 Allergic rhinitis due to pollen: Secondary | ICD-10-CM | POA: Diagnosis not present

## 2018-12-19 DIAGNOSIS — Z0000 Encounter for general adult medical examination without abnormal findings: Secondary | ICD-10-CM | POA: Diagnosis not present

## 2018-12-19 DIAGNOSIS — I10 Essential (primary) hypertension: Secondary | ICD-10-CM

## 2018-12-19 DIAGNOSIS — E038 Other specified hypothyroidism: Secondary | ICD-10-CM | POA: Diagnosis not present

## 2018-12-19 DIAGNOSIS — T7840XA Allergy, unspecified, initial encounter: Secondary | ICD-10-CM

## 2018-12-19 DIAGNOSIS — E7849 Other hyperlipidemia: Secondary | ICD-10-CM | POA: Diagnosis not present

## 2018-12-19 MED ORDER — LEVOTHYROXINE SODIUM 100 MCG PO TABS: ORAL_TABLET | ORAL | 1 refills | Status: DC

## 2018-12-19 MED ORDER — VALSARTAN-HYDROCHLOROTHIAZIDE 80-12.5 MG PO TABS: ORAL_TABLET | ORAL | 1 refills | Status: DC

## 2018-12-19 MED ORDER — PRAVASTATIN SODIUM 40 MG PO TABS: 40.0000 mg | ORAL_TABLET | Freq: Every day | ORAL | 1 refills | Status: DC

## 2018-12-19 MED ORDER — OMEPRAZOLE 20 MG PO CPDR: DELAYED_RELEASE_CAPSULE | ORAL | 1 refills | Status: DC

## 2018-12-19 NOTE — Progress Notes (Signed)
Subjective:    Patient ID: Thomas [name], male    DOB: 1934/11/23, 84 y.o.   MRN: [id number]  HPI  The patient comes in today for a wellness visit. This patient does have significant allergy issues and asthma issues he uses his Symbicort on a regular basis and albuterol intermittently states he is under good control he sees a asthma allergist in [place] but would like for [place] to do the shots up here but after discussion with the patient he understands that we do not do these type of allergy shots he is willing for referral to allergist in [place]   A review of their health history was completed.  A review of medications was also completed.  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   Additional concerns: would like to know id our office can take over his allergy shots when Dr Katherine Mantle in Dec  Check left ear lobe-it itches and goes numb He relates it goes numb on him the itchiness it does not burn it does not swell out denies any fever or chills with it denies any difference in his hearing he does use hearing aids  Review of Systems  Constitutional: Negative for diaphoresis and fatigue.  HENT: Negative for congestion and rhinorrhea.   Respiratory: Negative for cough and shortness of breath.   Cardiovascular: Negative for chest pain and leg swelling.  Gastrointestinal: Negative for abdominal pain and diarrhea.  Skin: Negative for color change and rash.  Neurological: Negative for dizziness and headaches.  Psychiatric/Behavioral: Negative for behavioral problems and confusion.       Objective:   Physical Exam Constitutional:      Appearance: He is well-developed.  HENT:     Head: Normocephalic and atraumatic.     Right Ear: External ear normal.     Left Ear: External  ear normal.     Nose: Nose normal.  Eyes:     Pupils: Pupils are equal, round, and reactive to light.  Neck:     Musculoskeletal: Normal range of motion and neck supple.     Thyroid: No thyromegaly.  Cardiovascular:     Rate and Rhythm: Normal rate and regular rhythm.     Heart sounds: Normal heart sounds. No murmur.  Pulmonary:     Effort: Pulmonary effort is normal. No respiratory distress.     Breath sounds: Normal breath sounds. No wheezing.  Abdominal:     General: Bowel sounds are normal. There is no distension.     Palpations: Abdomen is soft. There is no mass.     Tenderness: There is no abdominal tenderness.  Genitourinary:    Penis: Normal.   Musculoskeletal: Normal range of motion.  Lymphadenopathy:     Cervical: No cervical adenopathy.  Skin:    General: Skin is warm and dry.     Findings: No erythema.  Neurological:     Mental Status: He is alert.     Motor: No abnormal muscle tone.  Psychiatric:        Behavior: Behavior normal.        Judgment: Judgment normal.    Results for orders placed or performed during the hospital encounter of 11/25/18  TSH  Result Value Ref Range   TSH 0.627 0.350 - 4.500 uIU/mL  Basic metabolic  panel  Result Value Ref Range   Sodium 142 135 - 145 mmol/L   Potassium 4.1 3.5 - 5.1 mmol/L   Chloride 104 98 - 111 mmol/L   CO2 29 22 - 32 mmol/L   Glucose, Bld 95 70 - 99 mg/dL   BUN 14 8 - 23 mg/dL   Creatinine, Ser 1.19 0.61 - 1.24 mg/dL   Calcium 9.3 8.9 - 10.3 mg/dL   GFR calc non Af Amer 57 (L) >60 mL/min   GFR calc Af Amer >60 >60 mL/min   Anion gap 9 5 - 15  Hepatic function panel  Result Value Ref Range   Total Protein 7.5 6.5 - 8.1 g/dL   Albumin 4.4 3.5 - 5.0 g/dL   AST 27 15 - 41 U/L   ALT 28 0 - 44 U/L   Alkaline Phosphatase 51 38 - 126 U/L   Total Bilirubin 1.3 (H) 0.3 - 1.2 mg/dL   Bilirubin, Direct 0.2 0.0 - 0.2 mg/dL   Indirect Bilirubin 1.1 (H) 0.3 - 0.9 mg/dL  Lipid panel  Result Value Ref Range    Cholesterol 143 0 - 200 mg/dL   Triglycerides 165 (H) <150 mg/dL   HDL 34 (L) >40 mg/dL   Total CHOL/HDL Ratio 4.2 RATIO   VLDL 33 0 - 40 mg/dL   LDL Cholesterol 76 0 - 99 mg/dL          Assessment & Plan:  Adult wellness-complete.wellness physical was conducted today. Importance of diet and exercise were discussed in detail.  In addition to this a discussion regarding safety was also covered. We also reviewed over immunizations and gave recommendations regarding current immunization needed for age.  In addition to this additional areas were also touched on including: Preventative health exams needed:  Colonoscopy not indicated  It is hard to know what is causing the itching and burning in his ear I would not recommend any other particular intervention other than steroid cream  Patient was advised yearly wellness exam Vernard Gambles Grix recommended Flu shot recommended Lab work reviewed with the patient in detail  Hypothyroidism continue medication lab work shows good control  Blood pressure good control continue current medication  Blood pressure good control watch diet continue medication refills were sent in  Hyperlipidemia continue diet labs were reviewed with patient continue current medication  Bilirubin slightly elevated inconsequential currently patient understands this we will follow-up  Separate and identifiable chronic health issues including the left ear itching, referral to allergist, updates on his blood pressure hyperlipidemia and thyroid as well as review of labs and ordering of medicines was completed today In addition to his wellness  15 minutes was spent with patient today discussing healthcare issues which they came.  More than 50% of this visit-total duration of visit-was spent in counseling and coordination of care.  Please see diagnosis regarding the focus of this coordination and care  Follow-up 6 months

## 2018-12-20 MED ORDER — FLUTICASONE PROPIONATE 50 MCG/ACT NA SUSP: NASAL | 6 refills | Status: DC

## 2018-12-23 NOTE — Addendum Note (Signed)
Addended by: Carmelina Noun on: 12/23/2018 10:09 AM   Modules accepted: Orders

## 2018-12-23 NOTE — Progress Notes (Signed)
Referral put in.

## 2019-01-01 DIAGNOSIS — J309 Allergic rhinitis, unspecified: Secondary | ICD-10-CM | POA: Diagnosis not present

## 2019-01-02 ENCOUNTER — Encounter: Payer: Self-pay | Admitting: Family Medicine

## 2019-01-12 ENCOUNTER — Ambulatory Visit (INDEPENDENT_AMBULATORY_CARE_PROVIDER_SITE_OTHER): Payer: Medicare Other | Admitting: Otolaryngology

## 2019-01-12 DIAGNOSIS — J310 Chronic rhinitis: Secondary | ICD-10-CM | POA: Diagnosis not present

## 2019-01-12 DIAGNOSIS — J322 Chronic ethmoidal sinusitis: Secondary | ICD-10-CM | POA: Diagnosis not present

## 2019-01-12 DIAGNOSIS — J343 Hypertrophy of nasal turbinates: Secondary | ICD-10-CM | POA: Diagnosis not present

## 2019-01-12 DIAGNOSIS — J320 Chronic maxillary sinusitis: Secondary | ICD-10-CM

## 2019-01-13 DIAGNOSIS — J309 Allergic rhinitis, unspecified: Secondary | ICD-10-CM | POA: Diagnosis not present

## 2019-01-27 DIAGNOSIS — J309 Allergic rhinitis, unspecified: Secondary | ICD-10-CM | POA: Diagnosis not present

## 2019-02-07 ENCOUNTER — Other Ambulatory Visit: Payer: Self-pay | Admitting: Family Medicine

## 2019-02-09 NOTE — Telephone Encounter (Signed)
Please verify with patient If he is taking this he may have 6 months worth

## 2019-02-10 DIAGNOSIS — J309 Allergic rhinitis, unspecified: Secondary | ICD-10-CM | POA: Diagnosis not present

## 2019-02-10 NOTE — Telephone Encounter (Signed)
Left message to return call 

## 2019-02-18 ENCOUNTER — Ambulatory Visit: Payer: Medicare Other | Admitting: Allergy & Immunology

## 2019-02-20 ENCOUNTER — Other Ambulatory Visit: Payer: Self-pay

## 2019-02-20 DIAGNOSIS — Z20822 Contact with and (suspected) exposure to covid-19: Secondary | ICD-10-CM

## 2019-02-21 LAB — NOVEL CORONAVIRUS, NAA: SARS-CoV-2, NAA: NOT DETECTED

## 2019-02-24 ENCOUNTER — Telehealth: Payer: Self-pay | Admitting: *Deleted

## 2019-02-24 NOTE — Telephone Encounter (Signed)
Reviewed negative covid19 results with patient. No questions asked.  

## 2019-02-26 DIAGNOSIS — J309 Allergic rhinitis, unspecified: Secondary | ICD-10-CM | POA: Diagnosis not present

## 2019-03-10 DIAGNOSIS — J309 Allergic rhinitis, unspecified: Secondary | ICD-10-CM | POA: Diagnosis not present

## 2019-03-20 ENCOUNTER — Ambulatory Visit: Payer: Medicare Other | Admitting: Allergy & Immunology

## 2019-03-20 ENCOUNTER — Other Ambulatory Visit: Payer: Self-pay

## 2019-03-20 ENCOUNTER — Encounter: Payer: Self-pay | Admitting: Allergy & Immunology

## 2019-03-20 VITALS — BP 130/84 | HR 60 | Temp 98.1°F | Resp 16 | Ht 69.0 in | Wt 174.4 lb

## 2019-03-20 DIAGNOSIS — J3089 Other allergic rhinitis: Secondary | ICD-10-CM

## 2019-03-20 DIAGNOSIS — J302 Other seasonal allergic rhinitis: Secondary | ICD-10-CM | POA: Diagnosis not present

## 2019-03-20 DIAGNOSIS — J4540 Moderate persistent asthma, uncomplicated: Secondary | ICD-10-CM | POA: Diagnosis not present

## 2019-03-20 MED ORDER — EPINEPHRINE 0.3 MG/0.3ML IJ SOAJ: 0.3000 mg | INTRAMUSCULAR | 1 refills | Status: DC | PRN

## 2019-03-20 MED ORDER — BUDESONIDE-FORMOTEROL FUMARATE 80-4.5 MCG/ACT IN AERO: 1.0000 | INHALATION_SPRAY | Freq: Two times a day (BID) | RESPIRATORY_TRACT | 5 refills | Status: DC

## 2019-03-20 NOTE — Patient Instructions (Addendum)
1. Moderate persistent asthma, uncomplicated - Lung testing looked excellent. - We are not going to make any medication changes at this time. - Daily controller medication(s): Symbicort 80/4.58mcg one puff twice daily with spacer - Prior to physical activity: albuterol 2 puffs 10-15 minutes before physical activity. - Rescue medications: albuterol 4 puffs every 4-6 hours as needed - Changes during respiratory infections or worsening symptoms: Increase Symbicort 80/4.54mcg to 2 puffs twice daily for TWO WEEKS. - Asthma control goals:  * Full participation in all desired activities (may need albuterol before activity) * Albuterol use two time or less a week on average (not counting use with activity) * Cough interfering with sleep two time or less a month * Oral steroids no more than once a year * No hospitalizations  2. Seasonal and perennial allergic rhinitis - We are going to get your skin testing results from [facility] back next week for your injection (we are open Wednesdays and Fridays). - IT Consent signed. - EpiPen sent in. - Continue with Clarinex once daily.   3. Return in about 6 months (around 09/18/2019). This can be an in-person, a virtual Webex or a telephone follow up visit.   Please inform us of any Emergency Department visits, hospitalizations, or changes in symptoms. Call us before going to the ED for breathing or allergy symptoms since we might be able to fit you in for a sick visit. Feel free to contact us anytime with any questions, problems, or concerns.  It was a pleasure to meet you today!  Websites that have reliable patient information: 1. American Academy of Asthma, Allergy, and Immunology: www.aaaai.org 2. Food Allergy Research and Education (FARE): foodallergy.org 3. Mothers of Asthmatics: http://www.asthmacommunitynetwork.org 4. American College of Allergy, Asthma, and Immunology: www.acaai.org  "Like" [place] on Facebook and  Instagram for our latest updates!      Make sure you are registered to vote! If you have moved or changed any of your contact information, you will need to get this updated before voting!  In some cases, you MAY be able to register to vote online: [email or web address]

## 2019-03-20 NOTE — Progress Notes (Signed)
NEW PATIENT  Date of Service/Encounter:  03/20/19  Referring provider: [name], MD   Assessment:   Moderate persistent asthma, uncomplicated  Seasonal and perennial allergic rhinitis - on allergen immunotherapy for 10+ years  Plan/Recommendations:   1. Moderate persistent asthma, uncomplicated - Lung testing looked excellent. - We are not going to make any medication changes at this time. - Daily controller medication(s): Symbicort 80/4.34mcg one puff twice daily with spacer - Prior to physical activity: albuterol 2 puffs 10-15 minutes before physical activity. - Rescue medications: albuterol 4 puffs every 4-6 hours as needed - Changes during respiratory infections or worsening symptoms: Increase Symbicort 80/4.34mcg to 2 puffs twice daily for TWO WEEKS. - Asthma control goals:  * Full participation in all desired activities (may need albuterol before activity) * Albuterol use two time or less a week on average (not counting use with activity) * Cough interfering with sleep two time or less a month * Oral steroids no more than once a year * No hospitalizations  2. Seasonal and perennial allergic rhinitis - We are going to get your skin testing results from [place] back next week for your injection (we are open Wednesdays and Fridays). - We are going to use up your current vials and stop your allergy shots.  - If the symptoms start again, we can certainly remix your allergy shots. - IT Consent signed. - EpiPen sent in. - Continue with Clarinex once daily.   3. Return in about 6 months (around 09/18/2019). This can be an in-person, a virtual Webex or a telephone follow up visit.   Subjective:   Thomas [name] is a 84 y.o. male presenting today for evaluation of  Chief Complaint  Patient presents with  . Allergies    Allergy Injections - Wants to get them here.     Thomas [name] has a history of the following: Patient  Active Problem List   Diagnosis Date Noted  . PAC (premature atrial contraction) 01/11/2018  . Inguinal hernia 04/26/2016  . Abnormal CT of the abdomen 02/21/2016  . Hyperlipemia 09/14/2015  . Chest pain, rule out acute myocardial infarction 02/04/2015  . Chest pain 02/04/2015  . Polycythemia, secondary 09/11/2013  . Allergic rhinitis 09/11/2013  . Hypothyroidism 06/01/2013  . Reactive airway disease 08/29/2012  . Thyroid nodule 08/29/2012  . Chest pain, musculoskeletal 05/07/2011  . GERD (gastroesophageal reflux disease) 05/07/2011  . HTN (hypertension), benign 12/09/2009  . Essential hypertension 12/09/2009  . CORONARY ATHEROSCLEROSIS NATIVE CORONARY ARTERY 12/09/2009  . ABNORMAL ELECTROCARDIOGRAM 12/09/2009    History obtained from: chart review and patient.  [name] was referred by [name], MD.     Thomas [name] is a 84 y.o. male presenting for establishment of care. He was previously followed by [facility]. He has a PMH of asthma and pollen allergies.   Asthma/Respiratory Symptom History: He also has chronic asthma that is being well managed with Symbicort 80-4.38mcg 1 puff BID. He has no complaints or is experiencing any adverse side effects from his medication. ACT is 25 today, indicating excellent asthma control.   Allergic Rhinitis Symptom History: Patient states that he gets biweekly serum injections for his pollen allergy at the [facility] office, but must travels a total of an hour and a half per visit. Patient states that the Allergy and Immunology clinic in [place] is 15 miles away from his home and would like to receive his shots  here. Patient has been stable on his injections and has no complaints. He has not needed any antibiotics recently for sinusitis.   Otherwise, there is no history of other atopic diseases, including food allergies, drug allergies, stinging insect allergies, eczema, urticaria or contact dermatitis. There is no  significant infectious history. Vaccinations are up to date.    Past Medical History: Patient Active Problem List   Diagnosis Date Noted  . PAC (premature atrial contraction) 01/11/2018  . Inguinal hernia 04/26/2016  . Abnormal CT of the abdomen 02/21/2016  . Hyperlipemia 09/14/2015  . Chest pain, rule out acute myocardial infarction 02/04/2015  . Chest pain 02/04/2015  . Polycythemia, secondary 09/11/2013  . Allergic rhinitis 09/11/2013  . Hypothyroidism 06/01/2013  . Reactive airway disease 08/29/2012  . Thyroid nodule 08/29/2012  . Chest pain, musculoskeletal 05/07/2011  . GERD (gastroesophageal reflux disease) 05/07/2011  . HTN (hypertension), benign 12/09/2009  . Essential hypertension 12/09/2009  . CORONARY ATHEROSCLEROSIS NATIVE CORONARY ARTERY 12/09/2009  . ABNORMAL ELECTROCARDIOGRAM 12/09/2009    Medication List:  Allergies as of 03/20/2019      Reactions   Penicillins Other (See Comments)   Reaction many years ago-unknown reaction Has patient had a PCN reaction causing immediate rash, facial/tongue/throat swelling, SOB or lightheadedness with hypotension: Unknown Has patient had a PCN reaction causing severe rash involving mucus membranes or skin necrosis: Unknown Has patient had a PCN reaction that required hospitalization Unknown Has patient had a PCN reaction occurring within the last 10 years: No If all of the above answers are "NO", then may proceed with Cephalosporin use.      Medication List       Accurate as of March 20, 2019 12:10 PM. If you have any questions, ask your nurse or doctor.        albuterol 108 (90 Base) MCG/ACT inhaler Commonly known as: VENTOLIN HFA Inhale 2 puffs into the lungs every 4 (four) hours as needed.   ARTIFICIAL TEARS OP Place 1 drop into both eyes daily as needed (dry eyes).   budesonide-formoterol 80-4.5 MCG/ACT inhaler Commonly known as: SYMBICORT Inhale 1 puff into the lungs 2 (two) times daily. What changed: when  to take this Changed by: Valentina Shaggy, MD   EPINEPHrine 0.3 mg/0.3 mL [place] injection Commonly known as: EPI-PEN Inject 0.3 mLs (0.3 mg total) into the muscle as needed for anaphylaxis. Started by: Valentina Shaggy, MD   fluticasone 50 MCG/ACT nasal spray Commonly known as: FLONASE INHALE 1 SPRAY INTO EACH NOSTRIL TWICE DAILY.   gabapentin 400 MG capsule Commonly known as: NEURONTIN TAKE 1 CAPSULE BY MOUTH THREE TIMES DAILY.   ibuprofen 200 MG tablet Commonly known as: ADVIL Take 400 mg by mouth 2 (two) times daily as needed for headache or moderate pain.   ketoconazole 2 % cream Commonly known as: NIZORAL APPLY TO AFFECTED AREA TWO TIMES DAILY AS NEEDED FOR IRRITATION.   levothyroxine 100 MCG tablet Commonly known as: SYNTHROID TAKE ONE TABLET BY MOUTH DAILY BEFORE BREAKFAST.   mometasone 0.1 % cream Commonly known as: ELOCON   naproxen 500 MG tablet Commonly known as: Naprosyn 1 bid prn rib pain   omeprazole 20 MG capsule Commonly known as: PRILOSEC 1 qd   pravastatin 40 MG tablet Commonly known as: PRAVACHOL Take 1 tablet (40 mg total) by mouth at bedtime.   triamcinolone cream 0.1 % Commonly known as: KENALOG Apply 1 application topically 2 (two) times daily. What changed:   when to take this  reasons  to take this   valsartan-hydrochlorothiazide 80-12.5 MG tablet Commonly known as: DIOVAN-HCT TAKE (1) TABLET BY MOUTH ONCE DAILY       Birth History: non-contributory  Developmental History: non-contributory  Past Surgical History: Past Surgical History:  Procedure Laterality Date  . APPENDECTOMY     50 years ago  . BACK SURGERY     L-4 , L-5  patient states that it was years ago.  Thomas [name] COLONOSCOPY     at age 89  . COLONOSCOPY N/A 04/06/2016   Procedure: COLONOSCOPY;  Surgeon: Rogene Houston, MD;  Location: [facility];  Service: Endoscopy;  Laterality: N/A;  7:30  . EYE SURGERY     cataracts removed- bilateral- /w IOL  .  INGUINAL HERNIA REPAIR Left 05/14/2016   Procedure: OPEN REPAIR OF SYMPTOMATIC (PAINFUL) LEFT INGUINAL HERNIA WITH MESH;  Surgeon: Vickie Epley, MD;  Location: [facility];  Service: General;  Laterality: Left;  . SINOSCOPY    . THYROIDECTOMY Right 09/17/2012   Procedure: RIGHT HEMI-THYROIDECTOMY;  Surgeon: Ascencion Dike, MD;  Location: [facility];  Service: ENT;  Laterality: Right;  . UPPER GASTROINTESTINAL ENDOSCOPY     egd/ed  . YAG LASER APPLICATION Left [id number]   Procedure: YAG LASER APPLICATION;  Surgeon: Rutherford Guys, MD;  Location: [facility];  Service: Ophthalmology;  Laterality: Left;     Family History: Family History  Problem Relation Age of Onset  . Heart disease Mother   . Heart disease Father   . Heart disease Brother   . Healthy Son   . Allergic rhinitis Neg Hx   . Angioedema Neg Hx   . Asthma Neg Hx   . Atopy Neg Hx   . Eczema Neg Hx   . Immunodeficiency Neg Hx   . Urticaria Neg Hx      Social History: Lamontae lives at home in a house that is 82 years old. There is wood in the main living areas. They have electric heating and central cooling. There are cows and chickens outside of the home. There are no dust mite coverings on the bedding. He does chew tobacco. He is a retired Medical laboratory scientific officer working.    Review of Systems  Constitutional: Negative.  Negative for chills, fever, malaise/fatigue and weight loss.  HENT: Negative.  Negative for congestion, ear discharge and ear pain.   Eyes: Negative for pain, discharge and redness.  Respiratory: Negative for cough, sputum production, shortness of breath and wheezing.   Cardiovascular: Negative.  Negative for chest pain and palpitations.  Gastrointestinal: Negative for abdominal pain, constipation, diarrhea, heartburn, nausea and vomiting.  Skin: Negative.  Negative for itching and rash.  Neurological: Negative for dizziness and headaches.  Endo/Heme/Allergies: Negative for environmental allergies. Does not bruise/bleed easily.        Objective:   Blood pressure 130/84, pulse 60, temperature 98.1 F (36.7 C), temperature source Temporal, resp. rate 16, height 5\' 9"  (1.753 m), weight 174 lb 6.4 oz (79.1 kg), SpO2 94 %. Body mass index is 25.75 kg/m.   Physical Exam:   Physical Exam  Constitutional: He appears well-developed.  HENT:  Head: Normocephalic and atraumatic.  Right Ear: Tympanic membrane, external ear and ear canal normal. No drainage, swelling or tenderness. Tympanic membrane is not injected, not scarred, not erythematous, not retracted and not bulging.  Left Ear: Tympanic membrane, external ear and ear canal normal. No drainage, swelling or tenderness. Tympanic membrane is not injected, not scarred, not erythematous, not retracted and not bulging.  Nose: No mucosal edema, rhinorrhea, nasal deformity or septal deviation. No epistaxis. Right sinus exhibits no maxillary sinus tenderness and no frontal sinus tenderness. Left sinus exhibits no maxillary sinus tenderness and no frontal sinus tenderness.  Mouth/Throat: Uvula is midline and oropharynx is clear and moist. Mucous membranes are not pale and not dry.  Turbinates enlarged bilaterally. Some clear rhinorrhea present.   Eyes: Pupils are equal, round, and reactive to light. Conjunctivae and EOM are normal. Right eye exhibits no chemosis and no discharge. Left eye exhibits no chemosis and no discharge. Right conjunctiva is not injected. Left conjunctiva is not injected.  Cardiovascular: Normal rate, regular rhythm and normal heart sounds.  Respiratory: Effort normal and breath sounds normal. No accessory muscle usage. No tachypnea. No respiratory distress. He has no wheezes. He has no rhonchi. He has no rales. He exhibits no tenderness.  Moving air well in all lung fields.   GI: There is no abdominal tenderness. There is no rebound and no guarding.  Lymphadenopathy:       Head (right side): No submandibular, no tonsillar and no occipital adenopathy  present.       Head (left side): No submandibular, no tonsillar and no occipital adenopathy present.    He has no cervical adenopathy.  Neurological: He is alert.  Skin: No abrasion, no petechiae and no rash noted. Rash is not papular, not vesicular and not urticarial. No erythema. No pallor.  Psychiatric: He has a normal mood and affect.     Diagnostic studies:    Spirometry: results normal (FEV1: 2.00/82%, FVC: 2.86/73%, FEV1/FVC: 70%).    Spirometry consistent with normal pattern.   Allergy Studies: none         Salvatore Marvel, MD [facility]

## 2019-03-22 LAB — ALLERGEN PROFILE, MOLD
Aureobasidi Pullulans IgE: <0.1 kU/L
Candida Albicans IgE: <0.1 kU/L
M009-IgE Fusarium proliferatum: <0.1 kU/L
M014-IgE Epicoccum purpur: <0.1 kU/L
Phoma Betae IgE: <0.1 kU/L
Setomelanomma Rostrat: <0.1 kU/L

## 2019-03-22 LAB — IGE+ALLERGENS ZONE 2(30)
Alternaria Alternata IgE: <0.1 kU/L
Amer Sycamore IgE Qn: <0.1 kU/L
Aspergillus Fumigatus IgE: <0.1 kU/L
Bahia Grass IgE: <0.1 kU/L
Bermuda Grass IgE: <0.1 kU/L
Cat Dander IgE: <0.1 kU/L
Cedar, Mountain IgE: <0.1 kU/L
Cladosporium Herbarum IgE: <0.1 kU/L
Cockroach, American IgE: <0.1 kU/L
Common Silver Birch IgE: <0.1 kU/L
D Farinae IgE: <0.1 kU/L
D Pteronyssinus IgE: <0.1 kU/L
Dog Dander IgE: 0.19 kU/L — AB
Elm, American IgE: <0.1 kU/L
Hickory, White IgE: <0.1 kU/L
IgE (Immunoglobulin E), Serum: 452 IU/mL (ref 6–495)
Johnson Grass IgE: <0.1 kU/L
Maple/Box Elder IgE: <0.1 kU/L
Mucor Racemosus IgE: <0.1 kU/L
Mugwort IgE Qn: <0.1 kU/L
Nettle IgE: <0.1 kU/L
Oak, White IgE: <0.1 kU/L
Penicillium Chrysogen IgE: <0.1 kU/L
Pigweed, Rough IgE: <0.1 kU/L
Plantain, English IgE: <0.1 kU/L
Ragweed, Short IgE: <0.1 kU/L
Sheep Sorrel IgE Qn: <0.1 kU/L
Stemphylium Herbarum IgE: <0.1 kU/L
Sweet gum IgE RAST Ql: <0.1 kU/L
Timothy Grass IgE: <0.1 kU/L
White Mulberry IgE: <0.1 kU/L

## 2019-03-23 ENCOUNTER — Telehealth: Payer: Self-pay

## 2019-03-23 ENCOUNTER — Other Ambulatory Visit (HOSPITAL_COMMUNITY): Payer: Self-pay | Admitting: *Deleted

## 2019-03-23 DIAGNOSIS — D751 Secondary polycythemia: Secondary | ICD-10-CM

## 2019-03-23 DIAGNOSIS — D45 Polycythemia vera: Secondary | ICD-10-CM

## 2019-03-23 NOTE — Telephone Encounter (Signed)
Patient called and stated that he had his allergy vials from his previous allergist. He is wanting to be placed on the scheduled this Wednesday in the [place] office and is on the schedule for 03/25/2019 @ 9:30am. Please advise on IT.

## 2019-03-23 NOTE — Telephone Encounter (Signed)
He already signed the paperwork on Friday I believe. We will just continue with his 0.5 mL of the Red Vial (he has been on for 10+ years) every two weeks until he is out. We are going to trial him off of the allergy shots to see how he does.  Salvatore Marvel, MD [facility]

## 2019-03-24 ENCOUNTER — Other Ambulatory Visit: Payer: Self-pay

## 2019-03-24 ENCOUNTER — Inpatient Hospital Stay (HOSPITAL_COMMUNITY): Payer: Medicare Other | Attending: Hematology

## 2019-03-24 DIAGNOSIS — Z87442 Personal history of urinary calculi: Secondary | ICD-10-CM | POA: Insufficient documentation

## 2019-03-24 DIAGNOSIS — I10 Essential (primary) hypertension: Secondary | ICD-10-CM | POA: Insufficient documentation

## 2019-03-24 DIAGNOSIS — E039 Hypothyroidism, unspecified: Secondary | ICD-10-CM | POA: Diagnosis not present

## 2019-03-24 DIAGNOSIS — Z79899 Other long term (current) drug therapy: Secondary | ICD-10-CM | POA: Insufficient documentation

## 2019-03-24 DIAGNOSIS — D751 Secondary polycythemia: Secondary | ICD-10-CM | POA: Diagnosis not present

## 2019-03-24 DIAGNOSIS — K219 Gastro-esophageal reflux disease without esophagitis: Secondary | ICD-10-CM | POA: Diagnosis not present

## 2019-03-24 DIAGNOSIS — D45 Polycythemia vera: Secondary | ICD-10-CM

## 2019-03-24 DIAGNOSIS — M129 Arthropathy, unspecified: Secondary | ICD-10-CM | POA: Diagnosis not present

## 2019-03-24 LAB — COMPREHENSIVE METABOLIC PANEL
ALT: 33 U/L (ref 0–44)
AST: 29 U/L (ref 15–41)
Albumin: 4.2 g/dL (ref 3.5–5.0)
Alkaline Phosphatase: 60 U/L (ref 38–126)
Anion gap: 10 (ref 5–15)
BUN: 14 mg/dL (ref 8–23)
CO2: 30 mmol/L (ref 22–32)
Calcium: 9.1 mg/dL (ref 8.9–10.3)
Chloride: 99 mmol/L (ref 98–111)
Creatinine, Ser: 1.14 mg/dL (ref 0.61–1.24)
GFR calc Af Amer: >60 mL/min (ref >60)
GFR calc non Af Amer: 60 mL/min — ABNORMAL LOW (ref >60)
Glucose, Bld: 97 mg/dL (ref 70–99)
Potassium: 4 mmol/L (ref 3.5–5.1)
Sodium: 139 mmol/L (ref 135–145)
Total Bilirubin: 1.7 mg/dL — ABNORMAL HIGH (ref 0.3–1.2)
Total Protein: 7.1 g/dL (ref 6.5–8.1)

## 2019-03-24 LAB — CBC WITH DIFFERENTIAL/PLATELET
Abs Immature Granulocytes: 0.01 10*3/uL (ref 0.00–0.07)
Basophils Absolute: 0 10*3/uL (ref 0.0–0.1)
Basophils Relative: 1 %
Eosinophils Absolute: 0.2 10*3/uL (ref 0.0–0.5)
Eosinophils Relative: 4 %
HCT: 49.6 % (ref 39.0–52.0)
Hemoglobin: 17.1 g/dL — ABNORMAL HIGH (ref 13.0–17.0)
Immature Granulocytes: 0 %
Lymphocytes Relative: 28 %
Lymphs Abs: 1.4 10*3/uL (ref 0.7–4.0)
MCH: 33 pg (ref 26.0–34.0)
MCHC: 34.5 g/dL (ref 30.0–36.0)
MCV: 95.8 fL (ref 80.0–100.0)
Monocytes Absolute: 0.5 10*3/uL (ref 0.1–1.0)
Monocytes Relative: 10 %
Neutro Abs: 2.8 10*3/uL (ref 1.7–7.7)
Neutrophils Relative %: 57 %
Platelets: 171 10*3/uL (ref 150–400)
RBC: 5.18 MIL/uL (ref 4.22–5.81)
RDW: 12.7 % (ref 11.5–15.5)
WBC: 4.9 10*3/uL (ref 4.0–10.5)
nRBC: 0 % (ref 0.0–0.2)

## 2019-03-24 LAB — FERRITIN: Ferritin: 110 ng/mL (ref 24–336)

## 2019-03-24 LAB — LACTATE DEHYDROGENASE: LDH: 141 U/L (ref 98–192)

## 2019-03-24 NOTE — Telephone Encounter (Signed)
FYI Beth since as of now you are in the shot room in [place] for tomorrow.

## 2019-03-25 ENCOUNTER — Other Ambulatory Visit: Payer: Self-pay

## 2019-03-25 ENCOUNTER — Ambulatory Visit: Payer: Self-pay

## 2019-03-25 ENCOUNTER — Ambulatory Visit (INDEPENDENT_AMBULATORY_CARE_PROVIDER_SITE_OTHER): Payer: Medicare Other | Admitting: *Deleted

## 2019-03-25 DIAGNOSIS — J309 Allergic rhinitis, unspecified: Secondary | ICD-10-CM | POA: Diagnosis not present

## 2019-03-26 NOTE — Progress Notes (Signed)
Immunotherapy   Patient Details  Name: Thomas [name] MRN: [id number] Date of Birth: 1934-07-18  03/25/2019  Patient transferred allergy vial from [facility].  Red Vial, G-M-DM and received 0.50 ml.  Patient wants to finish this vial out and stop injection.  Patient does have Epi-Pen RX sent to pharmacy, but patient does not wish to pick up at this time since he will be stopping injection soon. Consent signed and patient instructions given. Patient did not wait after injection for his arm to be checked.   Maree Erie 03/26/2019, 1:27 PM

## 2019-03-31 ENCOUNTER — Inpatient Hospital Stay (HOSPITAL_COMMUNITY): Payer: Medicare Other | Admitting: Hematology

## 2019-03-31 ENCOUNTER — Other Ambulatory Visit: Payer: Self-pay

## 2019-03-31 ENCOUNTER — Encounter (HOSPITAL_COMMUNITY): Payer: Self-pay | Admitting: Hematology

## 2019-03-31 DIAGNOSIS — Z87442 Personal history of urinary calculi: Secondary | ICD-10-CM | POA: Diagnosis not present

## 2019-03-31 DIAGNOSIS — D751 Secondary polycythemia: Secondary | ICD-10-CM | POA: Diagnosis not present

## 2019-03-31 DIAGNOSIS — I10 Essential (primary) hypertension: Secondary | ICD-10-CM | POA: Diagnosis not present

## 2019-03-31 DIAGNOSIS — K219 Gastro-esophageal reflux disease without esophagitis: Secondary | ICD-10-CM | POA: Diagnosis not present

## 2019-03-31 DIAGNOSIS — E039 Hypothyroidism, unspecified: Secondary | ICD-10-CM | POA: Diagnosis not present

## 2019-03-31 DIAGNOSIS — M129 Arthropathy, unspecified: Secondary | ICD-10-CM | POA: Diagnosis not present

## 2019-03-31 DIAGNOSIS — Z79899 Other long term (current) drug therapy: Secondary | ICD-10-CM | POA: Diagnosis not present

## 2019-04-06 NOTE — Assessment & Plan Note (Signed)
1.  Secondary erythrocytosis: - Myeloproliferative disorder work-up was negative for Jak 2 and other mutations. -Last phlebotomy was in June 2018. -He does not have any aquagenic pruritus, erythromelalgia, other vasomotor symptoms. - We reviewed his blood work.  Hemoglobin is minimally elevated at 17.1.  Hematocrit 49.6 platelet count and white count was normal. -I have reviewed his medications.  He is on hydrochlorothiazide.  This could be causing volume contraction and elevated hematocrit.  No intervention needed at this time. -He will come back in 6 months for follow-up.

## 2019-04-06 NOTE — Progress Notes (Signed)
Thomas [name], [place] [id number]   CLINIC:  Medical Oncology/Hematology  PCP:  [name], MD [address] [place] [address] [phone]   REASON FOR VISIT:  Follow-up for erythrocytosis  CURRENT THERAPY: Clinical surveillance    INTERVAL HISTORY:  Thomas [name] 84 y.o. male presents today for follow-up.  Reports overall doing well.  Denies any significant fatigue.  Denies any pruritus.  Denies any flushing of the skin.  Denies any change in bowel habits.  Appetite is stable.  No weight loss.  Denies any fevers, chills, night sweats.  Thomas [name] is here for repeat labs and office visit.  REVIEW OF SYSTEMS:  Review of Systems  Constitutional: Negative.   HENT:  Negative.   Eyes: Negative.   Respiratory: Negative.   Cardiovascular: Negative.   Gastrointestinal: Negative.   Genitourinary: Negative.    Musculoskeletal: Positive for arthralgias and myalgias. Negative for gait problem.  Skin: Negative.   Neurological: Negative.  Negative for gait problem.  Hematological: Negative.   Psychiatric/Behavioral: Negative.      PAST MEDICAL/SURGICAL HISTORY:  Past Medical History:  Diagnosis Date  . Arthritis    "my entire back is gone"  . Asthma   . GERD (gastroesophageal reflux disease)   . H/O hiatal hernia   . Hernia   . History of stress test 2011  . Hypertension   . Hypothyroidism   . Kidney stones   . Pollen allergies   . Polycythemia   . Polycythemia, secondary 09/11/2013  . Renal calculus    some removed by Cystoscopy, one passed spontaneously  . Sleep apnea    mild no cpap   Past Surgical History:  Procedure Laterality Date  . APPENDECTOMY     50 years ago  . BACK SURGERY     L-4 , L-5  patient states that it was years ago.  Marland Kitchen COLONOSCOPY     at age 47  . COLONOSCOPY N/A 04/06/2016   Procedure: COLONOSCOPY;  Surgeon: Rogene Houston, MD;  [facility];  Service: Endoscopy;  Laterality: N/A;  7:30    . EYE SURGERY     cataracts removed- bilateral- /w IOL  . INGUINAL HERNIA REPAIR Left 05/14/2016   Procedure: OPEN REPAIR OF SYMPTOMATIC (PAINFUL) LEFT INGUINAL HERNIA WITH MESH;  Surgeon: Vickie Epley, MD;  Location: [facility];  Service: General;  Laterality: Left;  . SINOSCOPY    . THYROIDECTOMY Right 09/17/2012   Procedure: RIGHT HEMI-THYROIDECTOMY;  Surgeon: Ascencion Dike, MD;  Location: [place];  Service: ENT;  Laterality: Right;  . UPPER GASTROINTESTINAL ENDOSCOPY     egd/ed  . YAG LASER APPLICATION Left Q000111Q   Procedure: YAG LASER APPLICATION;  Surgeon: Rutherford Guys, MD;  Location: [facility];  Service: Ophthalmology;  Laterality: Left;     SOCIAL HISTORY:  Social History   Socioeconomic History  . Marital status: Widowed    Spouse name: Not on file  . Number of children: Not on file  . Years of education: Not on file  . Highest education level: Not on file  Occupational History  . Not on file  Tobacco Use  . Smoking status: Never Smoker  . Smokeless tobacco: Current User    Types: Chew  . Tobacco comment: Patient states that Thomas [name] has been chewing for 60 plus years  Substance and Sexual Activity  . Alcohol use: No    Comment: Patient states that Thomas [name] drinks a bout a  case of beer a year  . Drug use: No  . Sexual activity: Never  Other Topics Concern  . Not on file  Social History Narrative  . Not on file   Social Determinants of Health   Financial Resource Strain:   . Difficulty of Paying Living Expenses: Not on file  Food Insecurity:   . Worried About Charity fundraiser in the Last Year: Not on file  . Ran Out of Food in the Last Year: Not on file  Transportation Needs:   . Lack of Transportation (Medical): Not on file  . Lack of Transportation (Non-Medical): Not on file  Physical Activity:   . Days of Exercise per Week: Not on file  . Minutes of Exercise per Session: Not on file  Stress:   . Feeling of Stress : Not on file  Social Connections:   . Frequency  of Communication with Friends and Family: Not on file  . Frequency of Social Gatherings with Friends and Family: Not on file  . Attends Religious Services: Not on file  . Active Member of Clubs or Organizations: Not on file  . Attends Archivist Meetings: Not on file  . Marital Status: Not on file  Intimate Partner Violence:   . Fear of Current or Ex-Partner: Not on file  . Emotionally Abused: Not on file  . Physically Abused: Not on file  . Sexually Abused: Not on file    FAMILY HISTORY:  Family History  Problem Relation Age of Onset  . Heart disease Mother   . Heart disease Father   . Heart disease Brother   . Healthy Son   . Allergic rhinitis Neg Hx   . Angioedema Neg Hx   . Asthma Neg Hx   . Atopy Neg Hx   . Eczema Neg Hx   . Immunodeficiency Neg Hx   . Urticaria Neg Hx     CURRENT MEDICATIONS:  Outpatient Encounter Medications as of 03/31/2019  Medication Sig  . budesonide-formoterol (SYMBICORT) 80-4.5 MCG/ACT inhaler Inhale 1 puff into the lungs 2 (two) times daily.  . fluticasone (FLONASE) 50 MCG/ACT nasal spray INHALE 1 SPRAY INTO EACH NOSTRIL TWICE DAILY.  Marland Kitchen gabapentin (NEURONTIN) 400 MG capsule TAKE 1 CAPSULE BY MOUTH THREE TIMES DAILY.  Marland Kitchen levothyroxine (SYNTHROID) 100 MCG tablet TAKE ONE TABLET BY MOUTH DAILY BEFORE BREAKFAST.  Marland Kitchen omeprazole (PRILOSEC) 20 MG capsule 1 qd  . pravastatin (PRAVACHOL) 40 MG tablet Take 1 tablet (40 mg total) by mouth at bedtime.  . valsartan-hydrochlorothiazide (DIOVAN-HCT) 80-12.5 MG tablet TAKE (1) TABLET BY MOUTH ONCE DAILY  . albuterol (PROVENTIL HFA;VENTOLIN HFA) 108 (90 Base) MCG/ACT inhaler Inhale 2 puffs into the lungs every 4 (four) hours as needed. (Patient not taking: Reported on 03/31/2019)  . Hypromellose (ARTIFICIAL TEARS OP) Place 1 drop into both eyes daily as needed (dry eyes).   Marland Kitchen ibuprofen (ADVIL,MOTRIN) 200 MG tablet Take 400 mg by mouth 2 (two) times daily as needed for headache or moderate pain.  Marland Kitchen  ketoconazole (NIZORAL) 2 % cream APPLY TO AFFECTED AREA TWO TIMES DAILY AS NEEDED FOR IRRITATION. (Patient not taking: Reported on 03/31/2019)  . mometasone (ELOCON) 0.1 % cream   . naproxen (NAPROSYN) 500 MG tablet 1 bid prn rib pain (Patient not taking: Reported on 03/31/2019)  . triamcinolone cream (KENALOG) 0.1 % Apply 1 application topically 2 (two) times daily. (Patient not taking: Reported on 03/31/2019)  . [DISCONTINUED] EPINEPHrine 0.3 mg/0.3 mL IJ SOAJ injection Inject 0.3 mLs (  0.3 mg total) into the muscle as needed for anaphylaxis.   No facility-administered encounter medications on file as of 03/31/2019.    ALLERGIES:  Allergies  Allergen Reactions  . Penicillins Other (See Comments)    Reaction many years ago-unknown reaction Has patient had a PCN reaction causing immediate rash, facial/tongue/throat swelling, SOB or lightheadedness with hypotension: Unknown Has patient had a PCN reaction causing severe rash involving mucus membranes or skin necrosis: Unknown Has patient had a PCN reaction that required hospitalization Unknown Has patient had a PCN reaction occurring within the last 10 years: No If all of the above answers are "NO", then may proceed with Cephalosporin use.      PHYSICAL EXAM:  ECOG Performance status: 1  Vitals:   03/31/19 1326  BP: 117/66  Pulse: (!) 55  Resp: 18  Temp: 97.8 F (36.6 C)  SpO2: 97%   Filed Weights   03/31/19 1326  Weight: 173 lb (78.5 kg)    Physical Exam Constitutional:      Appearance: Normal appearance.  HENT:     Head: Normocephalic.     Right Ear: External ear normal.     Left Ear: External ear normal.     Nose: Nose normal.  Eyes:     Conjunctiva/sclera: Conjunctivae normal.  Cardiovascular:     Rate and Rhythm: Regular rhythm. Bradycardia present.     Pulses: Normal pulses.     Heart sounds: Normal heart sounds.  Pulmonary:     Effort: Pulmonary effort is normal.     Breath sounds: Normal breath sounds.    Abdominal:     General: Bowel sounds are normal.  Musculoskeletal:        General: Normal range of motion.     Cervical back: Normal range of motion.  Skin:    General: Skin is warm.  Neurological:     General: No focal deficit present.     Mental Status: Thomas [name] is alert and oriented to person, place, and time.  Psychiatric:        Mood and Affect: Mood normal.        Behavior: Behavior normal.      LABORATORY DATA:  I have reviewed the labs as listed.  CBC    Component Value Date/Time   WBC 4.9 03/24/2019 1042   RBC 5.18 03/24/2019 1042   HGB 17.1 (H) 03/24/2019 1042   HGB 18.1 (H) 09/12/2016 0816   HCT 49.6 03/24/2019 1042   HCT 52.0 (H) 09/12/2016 0816   PLT 171 03/24/2019 1042   PLT 172 09/12/2016 0816   MCV 95.8 03/24/2019 1042   MCV 93 09/12/2016 0816   MCH 33.0 03/24/2019 1042   MCHC 34.5 03/24/2019 1042   RDW 12.7 03/24/2019 1042   RDW 14.1 09/12/2016 0816   LYMPHSABS 1.4 03/24/2019 1042   LYMPHSABS 1.3 09/12/2016 0816   MONOABS 0.5 03/24/2019 1042   EOSABS 0.2 03/24/2019 1042   EOSABS 0.2 09/12/2016 0816   BASOSABS 0.0 03/24/2019 1042   BASOSABS 0.0 09/12/2016 0816   CMP Latest Ref Rng & Units 03/24/2019 11/25/2018 10/01/2018  Glucose 70 - 99 mg/dL 97 95 99  BUN 8 - 23 mg/dL 14 14 10   Creatinine 0.61 - 1.24 mg/dL 1.14 1.19 1.02  Sodium 135 - 145 mmol/L 139 142 142  Potassium 3.5 - 5.1 mmol/L 4.0 4.1 4.0  Chloride 98 - 111 mmol/L 99 104 103  CO2 22 - 32 mmol/L 30 29 30   Calcium 8.9 - 10.3 mg/dL 9.1  9.3 9.2  Total Protein 6.5 - 8.1 g/dL 7.1 7.5 6.7  Total Bilirubin 0.3 - 1.2 mg/dL 1.7(H) 1.3(H) 1.6(H)  Alkaline Phos 38 - 126 U/L 60 51 55  AST 15 - 41 U/L 29 27 24   ALT 0 - 44 U/L 33 28 26          ASSESSMENT & PLAN:   Polycythemia, secondary 1.  Secondary erythrocytosis: - Myeloproliferative disorder work-up was negative for Jak 2 and other mutations. -Last phlebotomy was in June 2018. -Thomas [name] does not have any aquagenic pruritus, erythromelalgia,  other vasomotor symptoms. - We reviewed his blood work.  Hemoglobin is minimally elevated at 17.1.  Hematocrit 49.6 platelet count and white count was normal. -I have reviewed his medications.  Thomas [name] is on hydrochlorothiazide.  This could be causing volume contraction and elevated hematocrit.  No intervention needed at this time. -Thomas [name] will come back in 6 months for follow-up.      [place] [phone]

## 2019-04-08 ENCOUNTER — Ambulatory Visit (INDEPENDENT_AMBULATORY_CARE_PROVIDER_SITE_OTHER): Payer: Medicare Other

## 2019-04-08 DIAGNOSIS — J309 Allergic rhinitis, unspecified: Secondary | ICD-10-CM | POA: Diagnosis not present

## 2019-04-13 ENCOUNTER — Other Ambulatory Visit: Payer: Self-pay | Admitting: Family Medicine

## 2019-04-14 NOTE — Telephone Encounter (Signed)
Please confirm that the patient does take this 3 times daily if so he may have this with 3 additional refills virtual follow-up in springtime has appointment in March

## 2019-04-14 NOTE — Telephone Encounter (Signed)
Left message to return call 

## 2019-04-22 ENCOUNTER — Ambulatory Visit (INDEPENDENT_AMBULATORY_CARE_PROVIDER_SITE_OTHER): Payer: Medicare Other

## 2019-04-22 DIAGNOSIS — J309 Allergic rhinitis, unspecified: Secondary | ICD-10-CM

## 2019-05-06 ENCOUNTER — Ambulatory Visit (INDEPENDENT_AMBULATORY_CARE_PROVIDER_SITE_OTHER): Payer: Medicare Other

## 2019-05-06 DIAGNOSIS — J309 Allergic rhinitis, unspecified: Secondary | ICD-10-CM | POA: Diagnosis not present

## 2019-05-11 NOTE — Progress Notes (Signed)
Received outside records from [facility]. It seems that his last testing was done in July 2012, although the date is difficult to read. He was positive to dust mite and house dust alone at that time. His current vials contain Grasses, Mold, and Dust Mite. I am unsure why this is not reflective of his most recent skin testing.   If he decides to stop allergy shots, we can always retest him.   Thomas Marvel, MD [facility]

## 2019-05-13 ENCOUNTER — Ambulatory Visit (INDEPENDENT_AMBULATORY_CARE_PROVIDER_SITE_OTHER): Payer: Medicare Other | Admitting: Family Medicine

## 2019-05-13 ENCOUNTER — Other Ambulatory Visit: Payer: Self-pay

## 2019-05-13 VITALS — BP 120/76 | Temp 98.1°F

## 2019-05-13 DIAGNOSIS — J019 Acute sinusitis, unspecified: Secondary | ICD-10-CM | POA: Diagnosis not present

## 2019-05-13 MED ORDER — AZITHROMYCIN 250 MG PO TABS: ORAL_TABLET | ORAL | 0 refills | Status: DC

## 2019-05-13 NOTE — Progress Notes (Signed)
   Subjective:    Patient ID: Thomas [name], male    DOB: 07/27/1934, 85 y.o.   MRN: [id number]  Sinus Problem This is a new problem. The current episode started in the past 7 days. Associated symptoms include congestion, headaches and sinus pressure. (Body aches)   Can you get covid vaccine if you are allergic to pcn I talked with patient he states all of this got triggered by drying conditions despite using humidifier over the past 3 days he states he has not been around anyone he does not feel he has Covid.  He denies body aches fever chills he just states he has his usual arthritis issues denies wheezing or shortness of breath  Review of Systems  HENT: Positive for congestion and sinus pressure.   Neurological: Positive for headaches.  See above virtual Visit via Video Note  I connected with Thomas [name] on 05/13/19 at  9:30 AM EST by a video enabled telemedicine application and verified that I am speaking with the correct person using two identifiers.  Location: Patient: home Provider: office   I discussed the limitations of evaluation and management by telemedicine and the availability of in person appointments. The patient expressed understanding and agreed to proceed.  History of Present Illness:    Observations/Objective:   Assessment and Plan:   Follow Up Instructions:    I discussed the assessment and treatment plan with the patient. The patient was provided an opportunity to ask questions and all were answered. The patient agreed with the plan and demonstrated an understanding of the instructions.   The patient was advised to call back or seek an in-person evaluation if the symptoms worsen or if the condition fails to improve as anticipated.  I provided 16 minutes of non-face-to-face time during this encounter.        Objective:   Physical Exam  Today's visit was via telephone Physical exam was not possible for this visit       Assessment &  Plan:  Acute redness sinus antibiotic prescribed warning signs discussed Follow-up if progressive troubles or course Recheck if any ongoing issues or worse I did give him information regarding Covid testing if he is interested Finally he would be a good candidate for Covid shot in his penicillin allergy should not interfere

## 2019-05-27 ENCOUNTER — Ambulatory Visit (INDEPENDENT_AMBULATORY_CARE_PROVIDER_SITE_OTHER): Payer: Medicare Other

## 2019-05-27 DIAGNOSIS — J309 Allergic rhinitis, unspecified: Secondary | ICD-10-CM | POA: Diagnosis not present

## 2019-06-10 ENCOUNTER — Ambulatory Visit (INDEPENDENT_AMBULATORY_CARE_PROVIDER_SITE_OTHER): Payer: Medicare Other

## 2019-06-10 DIAGNOSIS — J309 Allergic rhinitis, unspecified: Secondary | ICD-10-CM | POA: Diagnosis not present

## 2019-06-10 NOTE — Progress Notes (Signed)
Patient asked about the amount in his vial and stated that his doctor was going to take him off of the allergy injections. I placed a do not order on the vial.

## 2019-06-16 ENCOUNTER — Other Ambulatory Visit: Payer: Self-pay

## 2019-06-16 ENCOUNTER — Ambulatory Visit (INDEPENDENT_AMBULATORY_CARE_PROVIDER_SITE_OTHER): Payer: Medicare Other | Admitting: Family Medicine

## 2019-06-16 ENCOUNTER — Encounter: Payer: Self-pay | Admitting: Family Medicine

## 2019-06-16 VITALS — BP 130/72 | HR 54 | Temp 97.8°F

## 2019-06-16 DIAGNOSIS — E7849 Other hyperlipidemia: Secondary | ICD-10-CM | POA: Diagnosis not present

## 2019-06-16 DIAGNOSIS — I10 Essential (primary) hypertension: Secondary | ICD-10-CM | POA: Diagnosis not present

## 2019-06-16 DIAGNOSIS — E038 Other specified hypothyroidism: Secondary | ICD-10-CM | POA: Diagnosis not present

## 2019-06-16 MED ORDER — GABAPENTIN 400 MG PO CAPS: 400.0000 mg | ORAL_CAPSULE | Freq: Three times a day (TID) | ORAL | 1 refills | Status: DC

## 2019-06-16 MED ORDER — LEVOTHYROXINE SODIUM 100 MCG PO TABS: ORAL_TABLET | ORAL | 1 refills | Status: DC

## 2019-06-16 MED ORDER — VALSARTAN-HYDROCHLOROTHIAZIDE 80-12.5 MG PO TABS: ORAL_TABLET | ORAL | 1 refills | Status: DC

## 2019-06-16 MED ORDER — PRAVASTATIN SODIUM 40 MG PO TABS: 40.0000 mg | ORAL_TABLET | Freq: Every day | ORAL | 1 refills | Status: DC

## 2019-06-16 NOTE — Progress Notes (Signed)
   Subjective:    Patient ID: Thomas [name], male    DOB: [birth year], 85 y.o.   MRN: [id number] Audiovisual Hypertension This is a chronic problem. There are no compliance problems.   Pt states he is not having any issues with BP. Pt taking medication as prescribed.  Patient states he is doing well with medicine taking well with diet Fall Risk  06/16/2019 03/30/2014  Falls in the past year? 0 No  Number falls in past yr: 0 -  Injury with Fall? 0 -  Follow up Falls evaluation completed -    Pt states his fingers on his right hand have been cramping and feeling cold. States this has been going on for a while.  He relates at times it feels like it does not want to move as good as the other hand other times a little bit of pins-and-needles but he denies any blueness denies any swelling denies any change in appearance  Pt received 1st COVID shot, 2nd one this coming this Friday.   Virtual Visit via Telephone Note  I connected with MENDELL ALYEA on 06/16/19 at  9:00 AM EST by telephone and verified that I am speaking with the correct person using two identifiers.  Location: Patient: home Provider: office   I discussed the limitations, risks, security and privacy concerns of performing an evaluation and management service by telephone and the availability of in person appointments. I also discussed with the patient that there may be a patient responsible charge related to this service. The patient expressed understanding and agreed to proceed.   History of Present Illness:    Observations/Objective:   Assessment and Plan:   Follow Up Instructions:    I discussed the assessment and treatment plan with the patient. The patient was provided an opportunity to ask questions and all were answered. The patient agreed with the plan and demonstrated an understanding of the instructions.   The patient was advised to call back or seek an in-person evaluation if the symptoms worsen or if  the condition fails to improve as anticipated.  I provided 20 minutes of non-face-to-face time during this encounter.      Review of Systems     Objective:   Physical Exam   Virtual visit physical not possible     Assessment & Plan:  1. HTN (hypertension), benign Blood pressure reportedly good control continue current measures  2. Other specified hypothyroidism Thyroid takes his medication regular basis check lab work in the summer  3. Other hyperlipidemia Takes his medication watch his diet stays physically active Patient will need to do wellness visit later in the summer

## 2019-06-18 ENCOUNTER — Ambulatory Visit: Payer: Medicare Other | Admitting: Family Medicine

## 2019-06-24 ENCOUNTER — Ambulatory Visit (INDEPENDENT_AMBULATORY_CARE_PROVIDER_SITE_OTHER): Payer: Medicare Other

## 2019-06-24 DIAGNOSIS — J309 Allergic rhinitis, unspecified: Secondary | ICD-10-CM | POA: Diagnosis not present

## 2019-07-02 ENCOUNTER — Other Ambulatory Visit: Payer: Self-pay

## 2019-07-02 ENCOUNTER — Ambulatory Visit (INDEPENDENT_AMBULATORY_CARE_PROVIDER_SITE_OTHER): Payer: Medicare Other | Admitting: Family Medicine

## 2019-07-02 VITALS — BP 138/76 | Temp 97.6°F | Ht 69.0 in | Wt 175.0 lb

## 2019-07-02 DIAGNOSIS — R252 Cramp and spasm: Secondary | ICD-10-CM | POA: Diagnosis not present

## 2019-07-02 DIAGNOSIS — D510 Vitamin B12 deficiency anemia due to intrinsic factor deficiency: Secondary | ICD-10-CM | POA: Diagnosis not present

## 2019-07-02 DIAGNOSIS — E559 Vitamin D deficiency, unspecified: Secondary | ICD-10-CM | POA: Diagnosis not present

## 2019-07-02 DIAGNOSIS — E539 Vitamin B deficiency, unspecified: Secondary | ICD-10-CM | POA: Diagnosis not present

## 2019-07-02 NOTE — Progress Notes (Deleted)
Acute Office Visit  Subjective:    Patient ID: Thomas [name], male    DOB: [birth year], 85 y.o.   MRN: [id number]  Chief Complaint  Patient presents with  . Hand Pain    HPI Patient is in today for ***  Past Medical History:  Diagnosis Date  . Arthritis    "my entire back is gone"  . Asthma   . GERD (gastroesophageal reflux disease)   . H/O hiatal hernia   . Hernia   . History of stress test 2011  . Hypertension   . Hypothyroidism   . Kidney stones   . Pollen allergies   . Polycythemia   . Polycythemia, secondary 09/11/2013  . Renal calculus    some removed by Cystoscopy, one passed spontaneously  . Sleep apnea    mild no cpap    Past Surgical History:  Procedure Laterality Date  . APPENDECTOMY     50 years ago  . BACK SURGERY     L-4 , L-5  patient states that it was years ago.  Marland Kitchen COLONOSCOPY     at age 22  . COLONOSCOPY N/A 04/06/2016   Procedure: COLONOSCOPY;  Surgeon: Rogene Houston, MD;  [facility];  Service: Endoscopy;  Laterality: N/A;  7:30  . EYE SURGERY     cataracts removed- bilateral- /w IOL  . INGUINAL HERNIA REPAIR Left 05/14/2016   Procedure: OPEN REPAIR OF SYMPTOMATIC (PAINFUL) LEFT INGUINAL HERNIA WITH MESH;  Surgeon: Vickie Epley, MD;  Location[facility];  Service: General;  Laterality: Left;  . SINOSCOPY    . THYROIDECTOMY Right 09/17/2012   Procedure: RIGHT HEMI-THYROIDECTOMY;  Surgeon: Ascencion Dike, MD;  Location: [facility];  Service: ENT;  Laterality: Right;  . UPPER GASTROINTESTINAL ENDOSCOPY     egd/ed  . YAG LASER APPLICATION Left Q000111Q   Procedure: YAG LASER APPLICATION;  Surgeon: Rutherford Guys, MD;  Location: [facility];  Service: Ophthalmology;  Laterality: Left;    Family History  Problem Relation Age of Onset  . Heart disease Mother   . Heart disease Father   . Heart disease Brother   . Healthy Son   . Allergic rhinitis Neg Hx   . Angioedema Neg Hx   . Asthma Neg Hx   . Atopy Neg Hx   . Eczema Neg Hx   .  Immunodeficiency Neg Hx   . Urticaria Neg Hx     Social History   Socioeconomic History  . Marital status: Widowed    Spouse name: Not on file  . Number of children: Not on file  . Years of education: Not on file  . Highest education level: Not on file  Occupational History  . Not on file  Tobacco Use  . Smoking status: Never Smoker  . Smokeless tobacco: Current User    Types: Chew  . Tobacco comment: Patient states that he has been chewing for 60 plus years  Substance and Sexual Activity  . Alcohol use: No    Comment: Patient states that he drinks a bout a case of beer a year  . Drug use: No  . Sexual activity: Never  Other Topics Concern  . Not on file  Social History Narrative  . Not on file   Social Determinants of Health   Financial Resource Strain:   . Difficulty of Paying Living Expenses:   Food Insecurity:   . Worried About Charity fundraiser in the Last Year:   .  Ran Out of Food in the Last Year:   Transportation Needs:   . Film/video editor (Medical):   Marland Kitchen Lack of Transportation (Non-Medical):   Physical Activity:   . Days of Exercise per Week:   . Minutes of Exercise per Session:   Stress:   . Feeling of Stress :   Social Connections:   . Frequency of Communication with Friends and Family:   . Frequency of Social Gatherings with Friends and Family:   . Attends Religious Services:   . Active Member of Clubs or Organizations:   . Attends Archivist Meetings:   Marland Kitchen Marital Status:   Intimate Partner Violence:   . Fear of Current or Ex-Partner:   . Emotionally Abused:   Marland Kitchen Physically Abused:   . Sexually Abused:     Outpatient Medications Prior to Visit  Medication Sig Dispense Refill  . albuterol (PROVENTIL HFA;VENTOLIN HFA) 108 (90 Base) MCG/ACT inhaler Inhale 2 puffs into the lungs every 4 (four) hours as needed. 1 Inhaler 5  . budesonide-formoterol (SYMBICORT) 80-4.5 MCG/ACT inhaler Inhale 1 puff into the lungs 2 (two) times daily. 1  Inhaler 5  . fluticasone (FLONASE) 50 MCG/ACT nasal spray INHALE 1 SPRAY INTO EACH NOSTRIL TWICE DAILY. 16 g 6  . gabapentin (NEURONTIN) 400 MG capsule Take 1 capsule (400 mg total) by mouth 3 (three) times daily. 270 capsule 1  . Hypromellose (ARTIFICIAL TEARS OP) Place 1 drop into both eyes daily as needed (dry eyes).     Marland Kitchen ibuprofen (ADVIL,MOTRIN) 200 MG tablet Take 400 mg by mouth 2 (two) times daily as needed for headache or moderate pain.    Marland Kitchen ketoconazole (NIZORAL) 2 % cream APPLY TO AFFECTED AREA TWO TIMES DAILY AS NEEDED FOR IRRITATION. 60 g 2  . levothyroxine (SYNTHROID) 100 MCG tablet TAKE ONE TABLET BY MOUTH DAILY BEFORE BREAKFAST. 90 tablet 1  . mometasone (ELOCON) 0.1 % cream     . omeprazole (PRILOSEC) 20 MG capsule 1 qd 90 capsule 1  . pravastatin (PRAVACHOL) 40 MG tablet Take 1 tablet (40 mg total) by mouth at bedtime. 90 tablet 1  . triamcinolone cream (KENALOG) 0.1 % Apply 1 application topically 2 (two) times daily. 45 g 2  . valsartan-hydrochlorothiazide (DIOVAN-HCT) 80-12.5 MG tablet TAKE (1) TABLET BY MOUTH ONCE DAILY 90 tablet 1   No facility-administered medications prior to visit.    Allergies  Allergen Reactions  . Penicillins Other (See Comments)    Reaction many years ago-unknown reaction Has patient had a PCN reaction causing immediate rash, facial/tongue/throat swelling, SOB or lightheadedness with hypotension: Unknown Has patient had a PCN reaction causing severe rash involving mucus membranes or skin necrosis: Unknown Has patient had a PCN reaction that required hospitalization Unknown Has patient had a PCN reaction occurring within the last 10 years: No If all of the above answers are "NO", then may proceed with Cephalosporin use.     Review of Systems     Objective:    Physical Exam  There were no vitals taken for this visit. Wt Readings from Last 3 Encounters:  03/31/19 173 lb (78.5 kg)  03/20/19 174 lb 6.4 oz (79.1 kg)  12/19/18 176 lb (79.8  kg)    Health Maintenance Due  Topic Date Due  . TETANUS/TDAP  Never done    There are no preventive care reminders to display for this patient.   Lab Results  Component Value Date   TSH 0.627 11/25/2018   Lab Results  Component Value Date   WBC 4.9 03/24/2019   HGB 17.1 (H) 03/24/2019   HCT 49.6 03/24/2019   MCV 95.8 03/24/2019   PLT 171 03/24/2019   Lab Results  Component Value Date   NA 139 03/24/2019   K 4.0 03/24/2019   CO2 30 03/24/2019   GLUCOSE 97 03/24/2019   BUN 14 03/24/2019   CREATININE 1.14 03/24/2019   BILITOT 1.7 (H) 03/24/2019   ALKPHOS 60 03/24/2019   AST 29 03/24/2019   ALT 33 03/24/2019   PROT 7.1 03/24/2019   ALBUMIN 4.2 03/24/2019   CALCIUM 9.1 03/24/2019   ANIONGAP 10 03/24/2019   Lab Results  Component Value Date   CHOL 143 11/25/2018   Lab Results  Component Value Date   HDL 34 (L) 11/25/2018   Lab Results  Component Value Date   LDLCALC 76 11/25/2018   Lab Results  Component Value Date   TRIG 165 (H) 11/25/2018   Lab Results  Component Value Date   CHOLHDL 4.2 11/25/2018   No results found for: HGBA1C     Assessment & Plan:   Problem List Items Addressed This Visit    None       No orders of the defined types were placed in this encounter.    Dayton Bailiff, LPN

## 2019-07-02 NOTE — Progress Notes (Signed)
   Subjective:    Patient ID: Thomas [name], male    DOB: 09-08-1934, 85 y.o.   MRN: [id number]  HPIright hand cramping when eating or playing with his mouse on the computer. Started a few years ago but getting worse.   Significant hand cramping that goes on when he does any type of activity last more than a few minutes.  He states that hands causes his fingers to deviate if he rests the hand or massages it goes away denies any trigger to this other than overactivity.  Sometimes with computer work sometimes chainsaw she also states occasionally the end of his index and middle finger will turn purplish but this does not happen often he denies any other issues denies cramps in the legs or the arms otherwise  Review of Systems Please see above    Objective:   Physical Exam  Lungs clear respiratory normal heart regular no murmurs pulses in the hands are good  Ulnar and radial artery occluded and then when the pressure on the ulnar nerve was released patient's hand does become more red but it does take a few moments to do so    Assessment & Plan:  So I believe the patient is having muscle fatigue that is causing the cramping we will check some lab work to make sure nothing else: I believe his circulation is doing well overall although may not be as good as what it was when he was younger certainly if any of these issues get worse he is to let us know otherwise follow-up for regular health checkups await the results of the lab work

## 2019-07-03 LAB — BASIC METABOLIC PANEL
BUN/Creatinine Ratio: 9 — ABNORMAL LOW (ref 10–24)
BUN: 9 mg/dL (ref 8–27)
CO2: 27 mmol/L (ref 20–29)
Calcium: 9.1 mg/dL (ref 8.6–10.2)
Chloride: 101 mmol/L (ref 96–106)
Creatinine, Ser: 0.95 mg/dL (ref 0.76–1.27)
GFR calc Af Amer: 86 mL/min/{1.73_m2} (ref >59)
GFR calc non Af Amer: 74 mL/min/{1.73_m2} (ref >59)
Glucose: 93 mg/dL (ref 65–99)
Potassium: 3.6 mmol/L (ref 3.5–5.2)
Sodium: 141 mmol/L (ref 134–144)

## 2019-07-03 LAB — SPECIMEN STATUS REPORT

## 2019-07-03 LAB — MAGNESIUM: Magnesium: 2.1 mg/dL (ref 1.6–2.3)

## 2019-07-03 LAB — TSH: TSH: 0.471 u[IU]/mL (ref 0.450–4.500)

## 2019-07-05 ENCOUNTER — Telehealth: Payer: Self-pay | Admitting: Family Medicine

## 2019-07-05 NOTE — Telephone Encounter (Signed)
Nurses in regards to his lab work his B12 was in the low normal range.  Please have patient take vitamin B12 OTC 1000 mcg daily this will help keep his B12 in a good range  In a previous lab note I also recommended for the patient to take a vitamin B complex daily to see if it might help with his hand cramps  Next follow-up would be early September

## 2019-07-06 LAB — SPECIMEN STATUS REPORT

## 2019-07-06 LAB — VITAMIN B12: Vitamin B-12: 342 pg/mL (ref 232–1245)

## 2019-07-06 NOTE — Telephone Encounter (Signed)
Left message to return call 

## 2019-07-08 ENCOUNTER — Ambulatory Visit (INDEPENDENT_AMBULATORY_CARE_PROVIDER_SITE_OTHER): Payer: Medicare Other

## 2019-07-08 DIAGNOSIS — J309 Allergic rhinitis, unspecified: Secondary | ICD-10-CM

## 2019-07-08 NOTE — Telephone Encounter (Signed)
Patient advised per Dr Nicki Reaper: B12 was in the low normal range.  Please have patient take vitamin B12 OTC 1000 mcg daily this will help keep his B12 in a good range   In a previous lab note Dr Nicki Reaper also recommended for the patient to take a vitamin B complex daily to see if it might help with his hand cramps   Next follow-up would be early September  Patient verbalized understanding.

## 2019-07-15 ENCOUNTER — Other Ambulatory Visit: Payer: Self-pay | Admitting: Family Medicine

## 2019-07-22 ENCOUNTER — Ambulatory Visit (INDEPENDENT_AMBULATORY_CARE_PROVIDER_SITE_OTHER): Payer: Medicare Other

## 2019-07-22 DIAGNOSIS — J309 Allergic rhinitis, unspecified: Secondary | ICD-10-CM | POA: Diagnosis not present

## 2019-08-12 ENCOUNTER — Ambulatory Visit (INDEPENDENT_AMBULATORY_CARE_PROVIDER_SITE_OTHER): Payer: Medicare Other

## 2019-08-12 DIAGNOSIS — J309 Allergic rhinitis, unspecified: Secondary | ICD-10-CM

## 2019-09-16 ENCOUNTER — Telehealth: Payer: Self-pay | Admitting: Family Medicine

## 2019-09-16 NOTE — Telephone Encounter (Signed)
Pt would like his chart updated that he received the moderna vaccine on 05/21/19 and 06/19/19

## 2019-09-21 ENCOUNTER — Other Ambulatory Visit (HOSPITAL_COMMUNITY): Payer: Self-pay | Admitting: *Deleted

## 2019-09-21 DIAGNOSIS — D45 Polycythemia vera: Secondary | ICD-10-CM

## 2019-09-21 DIAGNOSIS — D751 Secondary polycythemia: Secondary | ICD-10-CM

## 2019-09-22 ENCOUNTER — Inpatient Hospital Stay (HOSPITAL_COMMUNITY): Payer: Medicare Other | Attending: Hematology

## 2019-09-22 DIAGNOSIS — D45 Polycythemia vera: Secondary | ICD-10-CM

## 2019-09-22 DIAGNOSIS — D751 Secondary polycythemia: Secondary | ICD-10-CM

## 2019-09-22 LAB — COMPREHENSIVE METABOLIC PANEL
ALT: 34 U/L (ref 0–44)
AST: 30 U/L (ref 15–41)
Albumin: 4.2 g/dL (ref 3.5–5.0)
Alkaline Phosphatase: 60 U/L (ref 38–126)
Anion gap: 9 (ref 5–15)
BUN: 13 mg/dL (ref 8–23)
CO2: 28 mmol/L (ref 22–32)
Calcium: 9 mg/dL (ref 8.9–10.3)
Chloride: 100 mmol/L (ref 98–111)
Creatinine, Ser: 1.08 mg/dL (ref 0.61–1.24)
GFR calc Af Amer: >60 mL/min (ref >60)
GFR calc non Af Amer: >60 mL/min (ref >60)
Glucose, Bld: 91 mg/dL (ref 70–99)
Potassium: 3.7 mmol/L (ref 3.5–5.1)
Sodium: 137 mmol/L (ref 135–145)
Total Bilirubin: 1.5 mg/dL — ABNORMAL HIGH (ref 0.3–1.2)
Total Protein: 7.2 g/dL (ref 6.5–8.1)

## 2019-09-22 LAB — CBC WITH DIFFERENTIAL/PLATELET
Abs Immature Granulocytes: 0.01 10*3/uL (ref 0.00–0.07)
Basophils Absolute: 0 10*3/uL (ref 0.0–0.1)
Basophils Relative: 1 %
Eosinophils Absolute: 0.1 10*3/uL (ref 0.0–0.5)
Eosinophils Relative: 3 %
HCT: 47.9 % (ref 39.0–52.0)
Hemoglobin: 16.8 g/dL (ref 13.0–17.0)
Immature Granulocytes: 0 %
Lymphocytes Relative: 27 %
Lymphs Abs: 1.2 10*3/uL (ref 0.7–4.0)
MCH: 33.3 pg (ref 26.0–34.0)
MCHC: 35.1 g/dL (ref 30.0–36.0)
MCV: 94.9 fL (ref 80.0–100.0)
Monocytes Absolute: 0.3 10*3/uL (ref 0.1–1.0)
Monocytes Relative: 7 %
Neutro Abs: 2.7 10*3/uL (ref 1.7–7.7)
Neutrophils Relative %: 62 %
Platelets: 162 10*3/uL (ref 150–400)
RBC: 5.05 MIL/uL (ref 4.22–5.81)
RDW: 13 % (ref 11.5–15.5)
WBC: 4.3 10*3/uL (ref 4.0–10.5)
nRBC: 0 % (ref 0.0–0.2)

## 2019-09-22 LAB — LACTATE DEHYDROGENASE: LDH: 144 U/L (ref 98–192)

## 2019-09-22 LAB — FERRITIN: Ferritin: 78 ng/mL (ref 24–336)

## 2019-09-29 ENCOUNTER — Inpatient Hospital Stay (HOSPITAL_BASED_OUTPATIENT_CLINIC_OR_DEPARTMENT_OTHER): Payer: Medicare Other | Admitting: Nurse Practitioner

## 2019-09-29 ENCOUNTER — Ambulatory Visit (HOSPITAL_COMMUNITY): Payer: Medicare Other | Admitting: Nurse Practitioner

## 2019-09-29 DIAGNOSIS — D751 Secondary polycythemia: Secondary | ICD-10-CM

## 2019-09-29 NOTE — Assessment & Plan Note (Signed)
1.  Secondary erythrocytosis: -Myeloproliferative disorder work-up was negative for Jak 2 and other mutations. -Last phlebotomy was on June 2018. -He does not have any aqua genic pruritus, erythromelalgia, or other vasomotor symptoms. -After reviewing his medications he is on hydrochlorothiazide.  This could be causing volume contraction and elevated adequate. -Labs done on 09/22/2019 showed hemoglobin 16.8, hematocrit 47.9 and platelets 162 -No intervention needed at this time. -He will follow-up in 6 months with repeat labs.

## 2019-09-29 NOTE — Progress Notes (Signed)
[facility] Follow up:    [name], MD [address] [place] [address]   DIAGNOSIS: Secondary polycythemia  CURRENT THERAPY: Observation  INTERVAL HISTORY: Thomas [name] 85 y.o. male was called for telephone visit for secondary polycythemia.  Patient reports he is doing well since his last visit.  He denies any aqua genic pruritus.  He denies any vasomotor symptoms. Denies any nausea, vomiting, or diarrhea. Denies any new pains. Had not noticed any recent bleeding such as epistaxis, hematuria or hematochezia. Denies recent chest pain on exertion, shortness of breath on minimal exertion, pre-syncopal episodes, or palpitations. Denies any numbness or tingling in hands or feet. Denies any recent fevers, infections, or recent hospitalizations. Patient reports appetite at 75% and energy level at 75%.  He is eating well maintain his weight at this time.    Patient Active Problem List   Diagnosis Date Noted  . PAC (premature atrial contraction) 01/11/2018  . Inguinal hernia 04/26/2016  . Abnormal CT of the abdomen 02/21/2016  . Hyperlipemia 09/14/2015  . Chest pain, rule out acute myocardial infarction 02/04/2015  . Chest pain 02/04/2015  . Polycythemia, secondary 09/11/2013  . Allergic rhinitis 09/11/2013  . Hypothyroidism 06/01/2013  . Reactive airway disease 08/29/2012  . Thyroid nodule 08/29/2012  . Chest pain, musculoskeletal 05/07/2011  . GERD (gastroesophageal reflux disease) 05/07/2011  . HTN (hypertension), benign 12/09/2009  . Essential hypertension 12/09/2009  . CORONARY ATHEROSCLEROSIS NATIVE CORONARY ARTERY 12/09/2009  . ABNORMAL ELECTROCARDIOGRAM 12/09/2009    is allergic to penicillins.  MEDICAL HISTORY: Past Medical History:  Diagnosis Date  . Arthritis    "my entire back is gone"  . Asthma   . GERD (gastroesophageal reflux disease)   . H/O hiatal hernia   . Hernia   . History of stress test 2011  . Hypertension    . Hypothyroidism   . Kidney stones   . Pollen allergies   . Polycythemia   . Polycythemia, secondary 09/11/2013  . Renal calculus    some removed by Cystoscopy, one passed spontaneously  . Sleep apnea    mild no cpap    SURGICAL HISTORY: Past Surgical History:  Procedure Laterality Date  . APPENDECTOMY     50 years ago  . BACK SURGERY     L-4 , L-5  patient states that it was years ago.  Marland Kitchen COLONOSCOPY     at age 63  . COLONOSCOPY N/A 04/06/2016   Procedure: COLONOSCOPY;  Surgeon: Rogene Houston, MD;  Location: [facility];  Service: Endoscopy;  Laterality: N/A;  7:30  . EYE SURGERY     cataracts removed- bilateral- /w IOL  . INGUINAL HERNIA REPAIR Left 05/14/2016   Procedure: OPEN REPAIR OF SYMPTOMATIC (PAINFUL) LEFT INGUINAL HERNIA WITH MESH;  Surgeon: Vickie Epley, MD;  Location: [facility];  Service: General;  Laterality: Left;  . SINOSCOPY    . THYROIDECTOMY Right 09/17/2012   Procedure: RIGHT HEMI-THYROIDECTOMY;  Surgeon: Ascencion Dike, MD;  Location: [facility];  Service: ENT;  Laterality: Right;  . UPPER GASTROINTESTINAL ENDOSCOPY     egd/ed  . YAG LASER APPLICATION Left 47/82/9562   Procedure: YAG LASER APPLICATION;  Surgeon: Rutherford Guys, MD;  Location: [facility];  Service: Ophthalmology;  Laterality: Left;    SOCIAL HISTORY: Social History   Socioeconomic History  . Marital status: Widowed    Spouse name: Not on file  . Number of children: Not on file  . Years  of education: Not on file  . Highest education level: Not on file  Occupational History  . Not on file  Tobacco Use  . Smoking status: Never Smoker  . Smokeless tobacco: Current User    Types: Chew  . Tobacco comment: Patient states that he has been chewing for 60 plus years  Substance and Sexual Activity  . Alcohol use: No    Comment: Patient states that he drinks a bout a case of beer a year  . Drug use: No  . Sexual activity: Never  Other Topics Concern  . Not on file  Social History Narrative  .  Not on file   Social Determinants of Health   Financial Resource Strain:   . Difficulty of Paying Living Expenses:   Food Insecurity:   . Worried About Charity fundraiser in the Last Year:   . Arboriculturist in the Last Year:   Transportation Needs:   . Film/video editor (Medical):   Marland Kitchen Lack of Transportation (Non-Medical):   Physical Activity:   . Days of Exercise per Week:   . Minutes of Exercise per Session:   Stress:   . Feeling of Stress :   Social Connections:   . Frequency of Communication with Friends and Family:   . Frequency of Social Gatherings with Friends and Family:   . Attends Religious Services:   . Active Member of Clubs or Organizations:   . Attends Archivist Meetings:   Marland Kitchen Marital Status:   Intimate Partner Violence:   . Fear of Current or Ex-Partner:   . Emotionally Abused:   Marland Kitchen Physically Abused:   . Sexually Abused:     FAMILY HISTORY: Family History  Problem Relation Age of Onset  . Heart disease Mother   . Heart disease Father   . Heart disease Brother   . Healthy Son   . Allergic rhinitis Neg Hx   . Angioedema Neg Hx   . Asthma Neg Hx   . Atopy Neg Hx   . Eczema Neg Hx   . Immunodeficiency Neg Hx   . Urticaria Neg Hx     Review of Systems  All other systems reviewed and are negative.   Vital signs: -Deferred due to telephone visit  Physical Exam -Deferred due to telephone visit -Patient was alert and oriented over the phone and in no acute distress   LABORATORY DATA:  CBC    Component Value Date/Time   WBC 4.3 09/22/2019 1405   RBC 5.05 09/22/2019 1405   HGB 16.8 09/22/2019 1405   HGB 18.1 (H) 09/12/2016 0816   HCT 47.9 09/22/2019 1405   HCT 52.0 (H) 09/12/2016 0816   PLT 162 09/22/2019 1405   PLT 172 09/12/2016 0816   MCV 94.9 09/22/2019 1405   MCV 93 09/12/2016 0816   MCH 33.3 09/22/2019 1405   MCHC 35.1 09/22/2019 1405   RDW 13.0 09/22/2019 1405   RDW 14.1 09/12/2016 0816   LYMPHSABS 1.2 09/22/2019  1405   LYMPHSABS 1.3 09/12/2016 0816   MONOABS 0.3 09/22/2019 1405   EOSABS 0.1 09/22/2019 1405   EOSABS 0.2 09/12/2016 0816   BASOSABS 0.0 09/22/2019 1405   BASOSABS 0.0 09/12/2016 0816    CMP     Component Value Date/Time   NA 137 09/22/2019 1405   NA 141 07/02/2019 0942   K 3.7 09/22/2019 1405   CL 100 09/22/2019 1405   CO2 28 09/22/2019 1405   GLUCOSE 91 09/22/2019 1405  BUN 13 09/22/2019 1405   BUN 9 07/02/2019 0942   CREATININE 1.08 09/22/2019 1405   CREATININE 1.16 08/11/2013 0727   CALCIUM 9.0 09/22/2019 1405   PROT 7.2 09/22/2019 1405   PROT 7.0 09/07/2015 0829   ALBUMIN 4.2 09/22/2019 1405   ALBUMIN 4.3 09/07/2015 0829   AST 30 09/22/2019 1405   ALT 34 09/22/2019 1405   ALKPHOS 60 09/22/2019 1405   BILITOT 1.5 (H) 09/22/2019 1405   BILITOT 0.8 09/07/2015 0829   GFRNONAA >60 09/22/2019 1405   GFRAA >60 09/22/2019 1405   All questions were answered to patient's stated satisfaction. Encouraged patient to call with any new concerns or questions before his next visit to the cancer center and we can certain see him sooner, if needed.     ASSESSMENT and THERAPY PLAN:   Polycythemia, secondary 1.  Secondary erythrocytosis: -Myeloproliferative disorder work-up was negative for Jak 2 and other mutations. -Last phlebotomy was on June 2018. -He does not have any aqua genic pruritus, erythromelalgia, or other vasomotor symptoms. -After reviewing his medications he is on hydrochlorothiazide.  This could be causing volume contraction and elevated adequate. -Labs done on 09/22/2019 showed hemoglobin 16.8, hematocrit 47.9 and platelets 162 -No intervention needed at this time. -He will follow-up in 6 months with repeat labs.   Orders Placed This Encounter  Procedures  . Lactate dehydrogenase    Standing Status:   Future    Standing Expiration Date:   09/28/2020  . CBC with Differential/Platelet    Standing Status:   Future    Standing Expiration Date:   09/28/2020  .  Comprehensive metabolic panel    Standing Status:   Future    Standing Expiration Date:   09/28/2020  . Ferritin    Standing Status:   Future    Standing Expiration Date:   09/28/2020  . Iron and TIBC    Standing Status:   Future    Standing Expiration Date:   09/28/2020  . Vitamin B12    Standing Status:   Future    Standing Expiration Date:   09/28/2020  . VITAMIN D 25 Hydroxy (Vit-D Deficiency, Fractures)    Standing Status:   Future    Standing Expiration Date:   09/28/2020  . Folate    Standing Status:   Future    Standing Expiration Date:   09/28/2020    All questions were answered. The patient knows to call the clinic with any problems, questions or concerns. We can certainly see the patient much sooner if necessary. This note was electronically signed.  I provided 29 minutes of non face-to-face telephone visit time during this encounter, and > 50% was spent counseling as documented under my assessment & plan.   Glennie Isle, NP-C 09/29/2019

## 2019-10-01 ENCOUNTER — Telehealth: Payer: Self-pay | Admitting: Family Medicine

## 2019-10-01 DIAGNOSIS — E7849 Other hyperlipidemia: Secondary | ICD-10-CM

## 2019-10-01 DIAGNOSIS — E038 Other specified hypothyroidism: Secondary | ICD-10-CM

## 2019-10-01 DIAGNOSIS — I10 Essential (primary) hypertension: Secondary | ICD-10-CM

## 2019-10-01 NOTE — Telephone Encounter (Signed)
Last labs 11/25/18 lipid, liver, bmp, tsh

## 2019-10-01 NOTE — Telephone Encounter (Signed)
Lipid, liver, metabolic 7, TSH-hyperlipidemia hypertension hypothyroidism

## 2019-10-01 NOTE — Telephone Encounter (Signed)
Pt made his yearly Phy for 12/22/19 needs to know what type of labs needs to be done.

## 2019-10-02 NOTE — Telephone Encounter (Signed)
Lab orders placed. Left message to return call  

## 2019-10-02 NOTE — Telephone Encounter (Signed)
Pt returned call and informed that labs were ordered. Mailed to patient as a reminder. Pt verbalized understanding

## 2019-10-09 ENCOUNTER — Encounter: Payer: Self-pay | Admitting: Allergy & Immunology

## 2019-10-09 ENCOUNTER — Ambulatory Visit: Payer: Medicare Other | Admitting: Allergy & Immunology

## 2019-10-09 ENCOUNTER — Other Ambulatory Visit: Payer: Self-pay

## 2019-10-09 VITALS — BP 126/78 | HR 48 | Temp 98.5°F | Resp 16 | Ht 69.0 in | Wt 179.0 lb

## 2019-10-09 DIAGNOSIS — J302 Other seasonal allergic rhinitis: Secondary | ICD-10-CM | POA: Diagnosis not present

## 2019-10-09 DIAGNOSIS — J3089 Other allergic rhinitis: Secondary | ICD-10-CM | POA: Diagnosis not present

## 2019-10-09 DIAGNOSIS — J4540 Moderate persistent asthma, uncomplicated: Secondary | ICD-10-CM

## 2019-10-09 NOTE — Patient Instructions (Addendum)
1. Moderate persistent asthma, uncomplicated - Lung testing not done today. - We are not going to make any medication changes at this time. - Daily controller medication(s): Symbicort 80/4.28mcg one puff twice daily with spacer - Prior to physical activity: albuterol 2 puffs 10-15 minutes before physical activity. - Rescue medications: albuterol 4 puffs every 4-6 hours as needed - Changes during respiratory infections or worsening symptoms: Increase Symbicort 80/4.52mcg to 2 puffs twice daily for TWO WEEKS. - Asthma control goals:  * Full participation in all desired activities (may need albuterol before activity) * Albuterol use two time or less a week on average (not counting use with activity) * Cough interfering with sleep two time or less a month * Oral steroids no more than once a year * No hospitalizations  2. Seasonal and perennial allergic rhinitis  - If your symptoms worsen, we can re-test you and start allergy shots again. - But at this point, I do not think that you need more allergy shots.  - Continue with Clarinex once daily.   3. Return in about 6 months (around 04/09/2020). This can be an in-person, a virtual Webex or a telephone follow up visit.   Please inform us of any Emergency Department visits, hospitalizations, or changes in symptoms. Call us before going to the ED for breathing or allergy symptoms since we might be able to fit you in for a sick visit. Feel free to contact us anytime with any questions, problems, or concerns.  It was a pleasure to see you again today! Good luck with those chickens!   Websites that have reliable patient information: 1. American Academy of Asthma, Allergy, and Immunology: www.aaaai.org 2. Food Allergy Research and Education (FARE): foodallergy.org 3. Mothers of Asthmatics: http://www.asthmacommunitynetwork.org 4. American College of Allergy, Asthma, and Immunology: www.acaai.org   COVID-19 Vaccine Information can be found at:  [email or web address].uk For questions related to vaccine distribution or appointments, please email [email or web address] or call [phone].     "Like" [place] on Facebook and Instagram for our latest updates!        Make sure you are registered to vote! If you have moved or changed any of your contact information, you will need to get this updated before voting!  In some cases, you MAY be able to register to vote online: [email or web address]

## 2019-10-09 NOTE — Progress Notes (Signed)
FOLLOW UP  Date of Service/Encounter:  10/09/19   Assessment:   Moderate persistent asthma, uncomplicated  Seasonal and perennial allergic rhinitis - on allergen immunotherapy for 10+ years  Plan/Recommendations:   1. Moderate persistent asthma, uncomplicated - Lung testing not done today. - We are not going to make any medication changes at this time. - Daily controller medication(s): Symbicort 80/4.78mcg one puff twice daily with spacer - Prior to physical activity: albuterol 2 puffs 10-15 minutes before physical activity. - Rescue medications: albuterol 4 puffs every 4-6 hours as needed - Changes during respiratory infections or worsening symptoms: Increase Symbicort 80/4.70mcg to 2 puffs twice daily for TWO WEEKS. - Asthma control goals:  * Full participation in all desired activities (may need albuterol before activity) * Albuterol use two time or less a week on average (not counting use with activity) * Cough interfering with sleep two time or less a month * Oral steroids no more than once a year * No hospitalizations  2. Seasonal and perennial allergic rhinitis  - If your symptoms worsen, we can re-test you and start allergy shots again. - But at this point, I do not think that you need more allergy shots.  - Continue with Clarinex once daily.   3. Return in about 6 months (around 04/09/2020). This can be an in-person, a virtual Webex or a telephone follow up visit.    Subjective:   Thomas [name] is a 85 y.o. male presenting today for follow up of  Chief Complaint  Patient presents with  . Asthma    doing fair. no issues with asthma  . Allergic Rhinitis     doing pretty good since stopping shots. a few minor spells of watery eyes, sneezing. nothing too bad though.     Thomas [name] has a history of the following: Patient Active Problem List   Diagnosis Date Noted  . PAC (premature atrial contraction) 01/11/2018  . Inguinal hernia 04/26/2016  .  Abnormal CT of the abdomen 02/21/2016  . Hyperlipemia 09/14/2015  . Chest pain, rule out acute myocardial infarction 02/04/2015  . Chest pain 02/04/2015  . Polycythemia, secondary 09/11/2013  . Allergic rhinitis 09/11/2013  . Hypothyroidism 06/01/2013  . Reactive airway disease 08/29/2012  . Thyroid nodule 08/29/2012  . Chest pain, musculoskeletal 05/07/2011  . GERD (gastroesophageal reflux disease) 05/07/2011  . HTN (hypertension), benign 12/09/2009  . Essential hypertension 12/09/2009  . CORONARY ATHEROSCLEROSIS NATIVE CORONARY ARTERY 12/09/2009  . ABNORMAL ELECTROCARDIOGRAM 12/09/2009    History obtained from: chart review and patient.  Thomas [name] is a 85 y.o. male presenting for a follow up visit.  He was last seen in December 2020 as a new patient.  At that time, his lung testing looked excellent.  We continued the Symbicort 80/4.5 mcg 1 puff twice daily with spacer, increasing to 2 puffs twice daily during flares.  We obtain his results from the outside allergist, which demonstrated positives to grasses, mold, and dust mite.  We decided to use up his current vials since he had been on for so long to see how he did without them.  We continued him on Clarinex once daily.  Since last visit, he has done very well.   Asthma/Respiratory Symptom History: He remains on the Symbicort one puff twice daily. Thang's asthma has been well controlled. He has not required rescue medication, experienced nocturnal awakenings due to lower respiratory symptoms, nor have activities of daily living been limited. He has required no Emergency Department or  Urgent Care visits for his asthma. He has required zero courses of systemic steroids for asthma exacerbations since the last visit. ACT score today is 25, indicating excellent asthma symptom control.   Allergic Rhinitis Symptom History: He is not taking anything on a daily basis. He does have some occasional sneezing. He has not needed antibiotics at all.  His symptoms are not even severe enough to warrant daily use of his medication. He is fine not doing the shots at all.   He has some practice hearing aids. His currently are getting fixed up.   He is fully vaccinated with the [facility] vaccination.    He has a history of ruptured discs around 60 years ago. He did have surgery 60 years ago.   Otherwise, there have been no changes to his past medical history, surgical history, family history, or social history.    Review of Systems  Constitutional: Negative.  Negative for fever, malaise/fatigue and weight loss.  HENT: Negative.  Negative for congestion, ear discharge and ear pain.   Eyes: Negative for pain, discharge and redness.  Respiratory: Negative for cough, sputum production, shortness of breath and wheezing.   Cardiovascular: Negative.  Negative for chest pain and palpitations.  Gastrointestinal: Negative for abdominal pain and heartburn.  Skin: Negative.  Negative for itching and rash.  Neurological: Negative for dizziness and headaches.  Endo/Heme/Allergies: Negative for environmental allergies. Does not bruise/bleed easily.       Objective:   Blood pressure 126/78, pulse (!) 48, temperature 98.5 F (36.9 C), temperature source Temporal, resp. rate 16, height 5\' 9"  (1.753 m), weight 179 lb (81.2 kg), SpO2 99 %. Body mass index is 26.43 kg/m.   Physical Exam: General:  alert, active, in no acute distress. Pleasant.  Head:  normocephalic, no masses, lesions, tenderness or abnormalities Eyes:  conjunctiva clear without injection or discharge, EOMI, PERL Ears:  TM's pearly white bilaterally, external auditory canals are clear, external ears are normally set and rotated Nose:  External nose within normal limits, normal appearing turbinates, no discharge, septum midline Throat:  moist mucous membranes without erythema, exudates or petechiae, no thrush Neck:  Supple without thyromegaly or adenopathy appreciated Lungs:  clear  to auscultation, no wheezing, crackles or rhonchi, breathing unlabored, moving air well in all lung fields Heart:  regular rate and rhythm, normal S1/S2, no murmurs or gallops, normal peripheral perfusion Neuro:  Normal mental status, speech normal, alert and oriented x3 Musculoskeletal:  no cyanosis, clubbing or edema Skin:  skin color, texture and turgor are normal; no bruising, rashes or lesions noted. Psych: Normal Affect and mood    Diagnostic studies: none     Salvatore Marvel, MD  [facility]

## 2019-10-29 ENCOUNTER — Other Ambulatory Visit: Payer: Self-pay

## 2019-10-29 ENCOUNTER — Ambulatory Visit (INDEPENDENT_AMBULATORY_CARE_PROVIDER_SITE_OTHER): Payer: Medicare Other | Admitting: Family Medicine

## 2019-10-29 VITALS — BP 122/78 | Temp 97.5°F | Wt 165.4 lb

## 2019-10-29 DIAGNOSIS — E7849 Other hyperlipidemia: Secondary | ICD-10-CM | POA: Diagnosis not present

## 2019-10-29 DIAGNOSIS — E038 Other specified hypothyroidism: Secondary | ICD-10-CM | POA: Diagnosis not present

## 2019-10-29 DIAGNOSIS — R110 Nausea: Secondary | ICD-10-CM | POA: Diagnosis not present

## 2019-10-29 DIAGNOSIS — R197 Diarrhea, unspecified: Secondary | ICD-10-CM | POA: Diagnosis not present

## 2019-10-29 MED ORDER — ONDANSETRON HCL 8 MG PO TABS
8.0000 mg | ORAL_TABLET | Freq: Three times a day (TID) | ORAL | 0 refills | Status: DC | PRN
Start: 2019-10-29 — End: 2022-07-03

## 2019-10-29 NOTE — Progress Notes (Signed)
   Subjective:    Patient ID: Thomas [name], male    DOB: 04/30/1934, 85 y.o.   MRN: [id number]  HPI  Patient arrives with off and on diarrhea, nausea and lack of appetite. Patient with intermittent nausea intermittent diarrhea loose stools denies high fever chills sweats denies vomiting energy level subpar the heat of the day takes it out of him.  Does not eat well.  Losing some weight.  Has had recent lab work please see. Review of Systems  Constitutional: Negative for activity change.  HENT: Negative for congestion and rhinorrhea.   Respiratory: Negative for cough and shortness of breath.   Cardiovascular: Negative for chest pain.  Gastrointestinal: Positive for diarrhea and nausea. Negative for abdominal pain and vomiting.  Genitourinary: Negative for dysuria and hematuria.  Neurological: Negative for weakness and headaches.  Psychiatric/Behavioral: Negative for behavioral problems and confusion.       Objective:   Physical Exam Vitals reviewed.  Constitutional:      General: He is not in acute distress. HENT:     Head: Normocephalic and atraumatic.  Eyes:     General:        Right eye: No discharge.        Left eye: No discharge.  Neck:     Trachea: No tracheal deviation.  Cardiovascular:     Rate and Rhythm: Normal rate and regular rhythm.     Heart sounds: Normal heart sounds. No murmur heard.   Pulmonary:     Effort: Pulmonary effort is normal. No respiratory distress.     Breath sounds: Normal breath sounds.  Lymphadenopathy:     Cervical: No cervical adenopathy.  Skin:    General: Skin is warm and dry.  Neurological:     Mental Status: He is alert.     Coordination: Coordination normal.  Psychiatric:        Behavior: Behavior normal.       Fall Risk  07/02/2019 06/16/2019 03/30/2014  Falls in the past year? 0 0 No  Number falls in past yr: - 0 -  Injury with Fall? - 0 -  Follow up - Falls evaluation completed -       Assessment & Plan:  1. Other  specified hypothyroidism Check thyroid function await results - TSH  2. Other hyperlipidemia Check lipid weight results - Lipid panel  3. Diarrhea, unspecified type Check stool testing and may use Imodium as needed - Stool Culture - Clostridium Difficile by PCR - CBC with Differential/Platelet - Hepatic function panel - Lipase - Basic metabolic panel  4. Nausea Zofran as needed - CBC with Differential/Platelet - Hepatic function panel - Lipase - Basic metabolic panel Weight loss could be related to a little less eating because of feeling fatigued from the excessive heat of the summer but need to check lab work to make sure there is not other underlying issues

## 2019-10-30 DIAGNOSIS — R197 Diarrhea, unspecified: Secondary | ICD-10-CM | POA: Diagnosis not present

## 2019-10-30 LAB — BASIC METABOLIC PANEL
BUN/Creatinine Ratio: 11 (ref 10–24)
BUN: 11 mg/dL (ref 8–27)
CO2: 28 mmol/L (ref 20–29)
Calcium: 9.1 mg/dL (ref 8.6–10.2)
Chloride: 100 mmol/L (ref 96–106)
Creatinine, Ser: 0.98 mg/dL (ref 0.76–1.27)
GFR calc Af Amer: 82 mL/min/{1.73_m2} (ref >59)
GFR calc non Af Amer: 71 mL/min/{1.73_m2} (ref >59)
Glucose: 80 mg/dL (ref 65–99)
Potassium: 4 mmol/L (ref 3.5–5.2)
Sodium: 140 mmol/L (ref 134–144)

## 2019-10-30 LAB — CBC WITH DIFFERENTIAL/PLATELET
Basophils Absolute: 0 10*3/uL (ref 0.0–0.2)
Basos: 1 %
EOS (ABSOLUTE): 0.1 10*3/uL (ref 0.0–0.4)
Eos: 3 %
Hematocrit: 49.1 % (ref 37.5–51.0)
Hemoglobin: 17.4 g/dL (ref 13.0–17.7)
Immature Grans (Abs): 0 10*3/uL (ref 0.0–0.1)
Immature Granulocytes: 0 %
Lymphocytes Absolute: 1.1 10*3/uL (ref 0.7–3.1)
Lymphs: 28 %
MCH: 33.8 pg — ABNORMAL HIGH (ref 26.6–33.0)
MCHC: 35.4 g/dL (ref 31.5–35.7)
MCV: 95 fL (ref 79–97)
Monocytes Absolute: 0.4 10*3/uL (ref 0.1–0.9)
Monocytes: 9 %
Neutrophils Absolute: 2.4 10*3/uL (ref 1.4–7.0)
Neutrophils: 59 %
Platelets: 148 10*3/uL — ABNORMAL LOW (ref 150–450)
RBC: 5.15 x10E6/uL (ref 4.14–5.80)
RDW: 12.6 % (ref 11.6–15.4)
WBC: 4.1 10*3/uL (ref 3.4–10.8)

## 2019-10-30 LAB — HEPATIC FUNCTION PANEL
ALT: 33 IU/L (ref 0–44)
AST: 29 IU/L (ref 0–40)
Albumin: 4.3 g/dL (ref 3.6–4.6)
Alkaline Phosphatase: 69 IU/L (ref 48–121)
Bilirubin Total: 1.2 mg/dL (ref 0.0–1.2)
Bilirubin, Direct: 0.28 mg/dL (ref 0.00–0.40)
Total Protein: 6.5 g/dL (ref 6.0–8.5)

## 2019-10-30 LAB — LIPID PANEL
Chol/HDL Ratio: 3.5 ratio (ref 0.0–5.0)
Cholesterol, Total: 133 mg/dL (ref 100–199)
HDL: 38 mg/dL — ABNORMAL LOW (ref >39)
LDL Chol Calc (NIH): 74 mg/dL (ref 0–99)
Triglycerides: 112 mg/dL (ref 0–149)
VLDL Cholesterol Cal: 21 mg/dL (ref 5–40)

## 2019-10-30 LAB — TSH: TSH: 0.46 u[IU]/mL (ref 0.450–4.500)

## 2019-10-30 LAB — LIPASE: Lipase: 20 U/L (ref 13–78)

## 2019-11-02 LAB — CLOSTRIDIUM DIFFICILE BY PCR: Toxigenic C. Difficile by PCR: NEGATIVE

## 2019-11-03 LAB — STOOL CULTURE: E coli, Shiga toxin Assay: NEGATIVE

## 2019-11-07 ENCOUNTER — Other Ambulatory Visit: Payer: Self-pay | Admitting: Family Medicine

## 2019-12-22 ENCOUNTER — Ambulatory Visit (INDEPENDENT_AMBULATORY_CARE_PROVIDER_SITE_OTHER): Payer: Medicare Other | Admitting: Family Medicine

## 2019-12-22 ENCOUNTER — Encounter: Payer: Self-pay | Admitting: Family Medicine

## 2019-12-22 ENCOUNTER — Other Ambulatory Visit: Payer: Self-pay

## 2019-12-22 VITALS — BP 132/82 | Temp 97.9°F | Ht 69.0 in | Wt 163.4 lb

## 2019-12-22 DIAGNOSIS — Z0000 Encounter for general adult medical examination without abnormal findings: Secondary | ICD-10-CM | POA: Diagnosis not present

## 2019-12-22 DIAGNOSIS — I10 Essential (primary) hypertension: Secondary | ICD-10-CM | POA: Diagnosis not present

## 2019-12-22 DIAGNOSIS — E038 Other specified hypothyroidism: Secondary | ICD-10-CM

## 2019-12-22 DIAGNOSIS — R42 Dizziness and giddiness: Secondary | ICD-10-CM

## 2019-12-22 MED ORDER — PRAVASTATIN SODIUM 40 MG PO TABS: 40.0000 mg | ORAL_TABLET | Freq: Every day | ORAL | 1 refills | Status: DC

## 2019-12-22 MED ORDER — GABAPENTIN 400 MG PO CAPS: 400.0000 mg | ORAL_CAPSULE | Freq: Three times a day (TID) | ORAL | 1 refills | Status: DC

## 2019-12-22 MED ORDER — VALSARTAN-HYDROCHLOROTHIAZIDE 80-12.5 MG PO TABS: ORAL_TABLET | ORAL | 1 refills | Status: DC

## 2019-12-22 MED ORDER — LEVOTHYROXINE SODIUM 100 MCG PO TABS: ORAL_TABLET | ORAL | 1 refills | Status: DC

## 2019-12-22 NOTE — Progress Notes (Signed)
Subjective:    Patient ID: Thomas [name], male    DOB: 29-Aug-1934, 85 y.o.   MRN: [id number]  HPI Patient denies any true vertigo he states at times he feels a little dizzy denies numbness tingling states the room not spinning denies headaches.  Symptoms come and go over the past several days.  No unilateral numbness or weakness.  Patient has underlying asthma issues which are stable Also hyperlipidemia and takes his medication on a regular basis Also with thyroid condition takes his medicine every morning. Denies depression. The patient comes in today for a wellness visit. Results for orders placed or performed in visit on 10/29/19  Stool Culture   Specimen: Stool   ST  Result Value Ref Range   Salmonella/Shigella Screen Final report    Stool Culture result 1 ([facility]ASHR) Comment    Campylobacter Culture Final report    Stool Culture result 1 (CMPCXR) Comment    E coli, Shiga toxin Assay Negative Negative  Clostridium Difficile by PCR   Specimen: STOOL   ST  Result Value Ref Range   Toxigenic C. Difficile by PCR Negative Negative  CBC with Differential/Platelet  Result Value Ref Range   WBC 4.1 3.4 - 10.8 x10E3/uL   RBC 5.15 4.14 - 5.80 x10E6/uL   Hemoglobin 17.4 13.0 - 17.7 g/dL   Hematocrit 49.1 37.5 - 51.0 %   MCV 95 79 - 97 fL   MCH 33.8 (H) 26.6 - 33.0 pg   MCHC 35.4 31 - 35 g/dL   RDW 12.6 11.6 - 15.4 %   Platelets 148 (L) 150 - 450 x10E3/uL   Neutrophils 59 Not Estab. %   Lymphs 28 Not Estab. %   Monocytes 9 Not Estab. %   Eos 3 Not Estab. %   Basos 1 Not Estab. %   Neutrophils Absolute 2.4 1 - 7 x10E3/uL   Lymphocytes Absolute 1.1 0 - 3 x10E3/uL   Monocytes Absolute 0.4 0 - 0 x10E3/uL   EOS (ABSOLUTE) 0.1 0.0 - 0.4 x10E3/uL   Basophils Absolute 0.0 0 - 0 x10E3/uL   Immature Granulocytes 0 Not Estab. %   Immature Grans (Abs) 0.0 0.0 - 0.1 x10E3/uL  Hepatic function panel  Result Value Ref Range   Total Protein 6.5 6.0 - 8.5 g/dL   Albumin 4.3 3.6 - 4.6  g/dL   Bilirubin Total 1.2 0.0 - 1.2 mg/dL   Bilirubin, Direct 0.28 0.00 - 0.40 mg/dL   Alkaline Phosphatase 69 48 - 121 IU/L   AST 29 0 - 40 IU/L   ALT 33 0 - 44 IU/L  Lipase  Result Value Ref Range   Lipase 20 13 - 78 U/L  Basic metabolic panel  Result Value Ref Range   Glucose 80 65 - 99 mg/dL   BUN 11 8 - 27 mg/dL   Creatinine, Ser 0.98 0.76 - 1.27 mg/dL   GFR calc non Af Amer 71 >59 mL/min/1.73   GFR calc Af Amer 82 >59 mL/min/1.73   BUN/Creatinine Ratio 11 10 - 24   Sodium 140 134 - 144 mmol/L   Potassium 4.0 3.5 - 5.2 mmol/L   Chloride 100 96 - 106 mmol/L   CO2 28 20 - 29 mmol/L   Calcium 9.1 8.6 - 10.2 mg/dL  Lipid panel  Result Value Ref Range   Cholesterol, Total 133 100 - 199 mg/dL   Triglycerides 112 0 - 149 mg/dL   HDL 38 (L) >39 mg/dL   VLDL Cholesterol Cal 21  5 - 40 mg/dL   LDL Chol Calc (NIH) 74 0 - 99 mg/dL   Chol/HDL Ratio 3.5 0.0 - 5.0 ratio  TSH  Result Value Ref Range   TSH 0.460 0.450 - 4.500 uIU/mL      A review of their health history was completed.  A review of medications was also completed.  Any needed refills; yes  Eating habits: pretty good  Falls/  MVA accidents in past few months: none  Regular exercise: farming  Specialist pt sees on regular basis: none  Preventative health issues were discussed.   Additional concerns: dizzy spells off and on- not currently having issue Fall Risk  12/22/2019 10/29/2019 07/02/2019 06/16/2019 03/30/2014  Falls in the past year? 0 0 0 0 No  Number falls in past yr: - - - 0 -  Injury with Fall? - - - 0 -  Follow up Falls evaluation completed Falls evaluation completed - Falls evaluation completed -    .fall Review of Systems  Constitutional: Negative for diaphoresis and fatigue.  HENT: Negative for congestion and rhinorrhea.   Respiratory: Negative for cough and shortness of breath.   Cardiovascular: Negative for chest pain and leg swelling.  Gastrointestinal: Negative for abdominal pain and  diarrhea.  Skin: Negative for color change and rash.  Neurological: Positive for dizziness. Negative for headaches.  Psychiatric/Behavioral: Negative for behavioral problems and confusion.       Objective:   Physical Exam Vitals reviewed.  Constitutional:      General: He is not in acute distress. HENT:     Head: Normocephalic and atraumatic.  Eyes:     General:        Right eye: No discharge.        Left eye: No discharge.  Neck:     Trachea: No tracheal deviation.  Cardiovascular:     Rate and Rhythm: Normal rate and regular rhythm.     Heart sounds: Normal heart sounds. No murmur heard.   Pulmonary:     Effort: Pulmonary effort is normal. No respiratory distress.     Breath sounds: Normal breath sounds.  Lymphadenopathy:     Cervical: No cervical adenopathy.  Skin:    General: Skin is warm and dry.  Neurological:     Mental Status: He is alert.     Coordination: Coordination normal.  Psychiatric:        Behavior: Behavior normal.    Patient does have PACs on cardiac exam otherwise regular No need to do prostate exam      Assessment & Plan:  Adult wellness-complete.wellness physical was conducted today. Importance of diet and exercise were discussed in detail.  In addition to this a discussion regarding safety was also covered. We also reviewed over immunizations and gave recommendations regarding current immunization needed for age.  In addition to this additional areas were also touched on including: Preventative health exams needed:  Colonoscopy not indicated  Patient was advised yearly wellness exam Patient will follow up for standard health issues by spring Blood pressure under good control currently continue current measures Patient takes his thyroid medicine regular basis have lab work done back in July  Dizziness intermittent I find no evidence of stroke on today's visit

## 2020-01-06 ENCOUNTER — Other Ambulatory Visit: Payer: Self-pay | Admitting: Family Medicine

## 2020-02-04 ENCOUNTER — Other Ambulatory Visit: Payer: Self-pay | Admitting: Family Medicine

## 2020-02-12 ENCOUNTER — Other Ambulatory Visit: Payer: Self-pay

## 2020-02-12 ENCOUNTER — Other Ambulatory Visit (INDEPENDENT_AMBULATORY_CARE_PROVIDER_SITE_OTHER): Payer: Medicare Other | Admitting: *Deleted

## 2020-02-12 DIAGNOSIS — Z23 Encounter for immunization: Secondary | ICD-10-CM | POA: Diagnosis not present

## 2020-03-04 ENCOUNTER — Other Ambulatory Visit: Payer: Self-pay | Admitting: Family Medicine

## 2020-03-24 ENCOUNTER — Other Ambulatory Visit (HOSPITAL_COMMUNITY): Payer: Medicare Other

## 2020-03-31 ENCOUNTER — Telehealth (HOSPITAL_COMMUNITY): Payer: Medicare Other | Admitting: Nurse Practitioner

## 2020-04-04 ENCOUNTER — Other Ambulatory Visit: Payer: Self-pay | Admitting: Allergy & Immunology

## 2020-04-04 ENCOUNTER — Other Ambulatory Visit: Payer: Self-pay | Admitting: Family Medicine

## 2020-04-05 ENCOUNTER — Other Ambulatory Visit (HOSPITAL_COMMUNITY): Payer: Self-pay

## 2020-04-05 DIAGNOSIS — D751 Secondary polycythemia: Secondary | ICD-10-CM

## 2020-04-05 DIAGNOSIS — D45 Polycythemia vera: Secondary | ICD-10-CM

## 2020-04-06 ENCOUNTER — Inpatient Hospital Stay (HOSPITAL_COMMUNITY): Payer: Medicare Other | Attending: Medical

## 2020-04-06 ENCOUNTER — Other Ambulatory Visit: Payer: Self-pay

## 2020-04-06 DIAGNOSIS — Z8249 Family history of ischemic heart disease and other diseases of the circulatory system: Secondary | ICD-10-CM | POA: Insufficient documentation

## 2020-04-06 DIAGNOSIS — R519 Headache, unspecified: Secondary | ICD-10-CM | POA: Insufficient documentation

## 2020-04-06 DIAGNOSIS — D751 Secondary polycythemia: Secondary | ICD-10-CM | POA: Insufficient documentation

## 2020-04-06 DIAGNOSIS — F17290 Nicotine dependence, other tobacco product, uncomplicated: Secondary | ICD-10-CM | POA: Diagnosis not present

## 2020-04-06 DIAGNOSIS — Z79899 Other long term (current) drug therapy: Secondary | ICD-10-CM | POA: Diagnosis not present

## 2020-04-06 DIAGNOSIS — D509 Iron deficiency anemia, unspecified: Secondary | ICD-10-CM | POA: Diagnosis not present

## 2020-04-06 DIAGNOSIS — D45 Polycythemia vera: Secondary | ICD-10-CM

## 2020-04-06 LAB — CBC WITH DIFFERENTIAL/PLATELET
Abs Immature Granulocytes: 0 10*3/uL (ref 0.00–0.07)
Basophils Absolute: 0 10*3/uL (ref 0.0–0.1)
Basophils Relative: 1 %
Eosinophils Absolute: 0.2 10*3/uL (ref 0.0–0.5)
Eosinophils Relative: 4 %
HCT: 47.7 % (ref 39.0–52.0)
Hemoglobin: 16.5 g/dL (ref 13.0–17.0)
Immature Granulocytes: 0 %
Lymphocytes Relative: 28 %
Lymphs Abs: 1.4 10*3/uL (ref 0.7–4.0)
MCH: 32.9 pg (ref 26.0–34.0)
MCHC: 34.6 g/dL (ref 30.0–36.0)
MCV: 95.2 fL (ref 80.0–100.0)
Monocytes Absolute: 0.3 10*3/uL (ref 0.1–1.0)
Monocytes Relative: 7 %
Neutro Abs: 3 10*3/uL (ref 1.7–7.7)
Neutrophils Relative %: 60 %
Platelets: 170 10*3/uL (ref 150–400)
RBC: 5.01 MIL/uL (ref 4.22–5.81)
RDW: 12.6 % (ref 11.5–15.5)
WBC: 5 10*3/uL (ref 4.0–10.5)
nRBC: 0 % (ref 0.0–0.2)

## 2020-04-06 LAB — FERRITIN: Ferritin: 89 ng/mL (ref 24–336)

## 2020-04-06 LAB — COMPREHENSIVE METABOLIC PANEL
ALT: 38 U/L (ref 0–44)
AST: 33 U/L (ref 15–41)
Albumin: 4 g/dL (ref 3.5–5.0)
Alkaline Phosphatase: 54 U/L (ref 38–126)
Anion gap: 8 (ref 5–15)
BUN: 10 mg/dL (ref 8–23)
CO2: 31 mmol/L (ref 22–32)
Calcium: 9.2 mg/dL (ref 8.9–10.3)
Chloride: 99 mmol/L (ref 98–111)
Creatinine, Ser: 1.01 mg/dL (ref 0.61–1.24)
GFR, Estimated: >60 mL/min (ref >60)
Glucose, Bld: 91 mg/dL (ref 70–99)
Potassium: 4.3 mmol/L (ref 3.5–5.1)
Sodium: 138 mmol/L (ref 135–145)
Total Bilirubin: 1.1 mg/dL (ref 0.3–1.2)
Total Protein: 6.9 g/dL (ref 6.5–8.1)

## 2020-04-06 LAB — VITAMIN D 25 HYDROXY (VIT D DEFICIENCY, FRACTURES): Vit D, 25-Hydroxy: 36.25 ng/mL (ref 30–100)

## 2020-04-06 LAB — LACTATE DEHYDROGENASE: LDH: 147 U/L (ref 98–192)

## 2020-04-06 LAB — FOLATE: Folate: 13.4 ng/mL (ref >5.9)

## 2020-04-06 LAB — VITAMIN B12: Vitamin B-12: 279 pg/mL (ref 180–914)

## 2020-04-06 LAB — IRON AND TIBC
Iron: 121 ug/dL (ref 45–182)
Saturation Ratios: 36 % (ref 17.9–39.5)
TIBC: 332 ug/dL (ref 250–450)
UIBC: 211 ug/dL

## 2020-04-13 ENCOUNTER — Inpatient Hospital Stay (HOSPITAL_BASED_OUTPATIENT_CLINIC_OR_DEPARTMENT_OTHER): Payer: Medicare Other | Admitting: Oncology

## 2020-04-13 ENCOUNTER — Other Ambulatory Visit: Payer: Self-pay

## 2020-04-13 ENCOUNTER — Ambulatory Visit (INDEPENDENT_AMBULATORY_CARE_PROVIDER_SITE_OTHER): Payer: Medicare Other | Admitting: Allergy & Immunology

## 2020-04-13 ENCOUNTER — Encounter: Payer: Self-pay | Admitting: Allergy & Immunology

## 2020-04-13 VITALS — BP 128/62 | HR 56 | Temp 98.2°F | Resp 17 | Ht 68.11 in | Wt 168.0 lb

## 2020-04-13 DIAGNOSIS — D751 Secondary polycythemia: Secondary | ICD-10-CM | POA: Diagnosis not present

## 2020-04-13 DIAGNOSIS — J3089 Other allergic rhinitis: Secondary | ICD-10-CM

## 2020-04-13 DIAGNOSIS — J4540 Moderate persistent asthma, uncomplicated: Secondary | ICD-10-CM | POA: Diagnosis not present

## 2020-04-13 DIAGNOSIS — J302 Other seasonal allergic rhinitis: Secondary | ICD-10-CM | POA: Diagnosis not present

## 2020-04-13 MED ORDER — SYMBICORT 80-4.5 MCG/ACT IN AERO: 2.0000 | INHALATION_SPRAY | Freq: Two times a day (BID) | RESPIRATORY_TRACT | 5 refills | Status: DC

## 2020-04-13 NOTE — Progress Notes (Signed)
FOLLOW UP  Date of Service/Encounter:  04/13/20   Assessment:   Moderate persistent asthma, uncomplicated  Seasonal and perennial allergic rhinitis- on allergen immunotherapy for 10+ years (stopped 2021)  Plan/Recommendations:   1. Moderate persistent asthma, uncomplicated - Lung testing not done today. - We are not going to make any medication changes at this time. - Daily controller medication(s): Symbicort 80/4.7mcg one puff twice daily with spacer - Prior to physical activity: albuterol 2 puffs 10-15 minutes before physical activity. - Rescue medications: albuterol 4 puffs every 4-6 hours as needed - Changes during respiratory infections or worsening symptoms: Increase Symbicort 80/4.25mcg to 2 puffs twice daily for TWO WEEKS. - Asthma control goals:  * Full participation in all desired activities (may need albuterol before activity) * Albuterol use two time or less a week on average (not counting use with activity) * Cough interfering with sleep two time or less a month * Oral steroids no more than once a year * No hospitalizations  2. Seasonal and perennial allergic rhinitis  - We will continue to hold allergy shots. - Continue with Clarinex once daily.   3. Return in about 6 months (around 10/12/2020).   Subjective:   Thomas [name] is a 85 y.o. male presenting today for follow up of  Chief Complaint  Patient presents with  . Asthma    Thomas [name] has a history of the following: Patient Active Problem List   Diagnosis Date Noted  . PAC (premature atrial contraction) 01/11/2018  . Inguinal hernia 04/26/2016  . Abnormal CT of the abdomen 02/21/2016  . Hyperlipemia 09/14/2015  . Chest pain, rule out acute myocardial infarction 02/04/2015  . Chest pain 02/04/2015  . Polycythemia, secondary 09/11/2013  . Allergic rhinitis 09/11/2013  . Hypothyroidism 06/01/2013  . Reactive airway disease 08/29/2012  . Thyroid nodule 08/29/2012  . Chest pain,  musculoskeletal 05/07/2011  . GERD (gastroesophageal reflux disease) 05/07/2011  . HTN (hypertension), benign 12/09/2009  . Essential hypertension 12/09/2009  . CORONARY ATHEROSCLEROSIS NATIVE CORONARY ARTERY 12/09/2009  . ABNORMAL ELECTROCARDIOGRAM 12/09/2009    History obtained from: chart review and patient.  Thomas [name] is a 85 y.o. male presenting for a follow up visit. He was last seen in June 2021. At that time, his lung testing was not done. We did not make any medication changes. We continued her 80/4.5 mcg 2 puffs twice daily with a spacer. For his rhinitis, his symptoms are under good control even off of the allergy shots. We continued with Clarinex 1 tablet daily.  Since last visit, he has done very well. He is here because he needs prescription refills. He has been using Symbicort 2 puffs twice daily. He does not use a spacer. That has been controlling his medicines very well. ACT score is 25, indicating excellent asthma control. He has not been to the emergency room and has not needed any systemic steroids for his symptoms. He has not used his rescue inhaler in over 2 years.  Allergies are under good control with Clarinex. He thinks that he can continue to avoid allergy shots.  He had a good Christmas. He is present with his son, who lives in [place].  Otherwise, there have been no changes to his past medical history, surgical history, family history, or social history.    Review of Systems  Constitutional: Negative.  Negative for fever, malaise/fatigue and weight loss.  HENT: Negative.  Negative for congestion, ear discharge and ear pain.   Eyes: Negative for pain,  discharge and redness.  Respiratory: Negative for cough, sputum production, shortness of breath and wheezing.   Cardiovascular: Negative.  Negative for chest pain and palpitations.  Gastrointestinal: Negative for abdominal pain, heartburn, nausea and vomiting.  Skin: Negative.  Negative for itching and rash.   Neurological: Negative for dizziness and headaches.  Endo/Heme/Allergies: Negative for environmental allergies. Does not bruise/bleed easily.       Objective:   Blood pressure 128/62, pulse (!) 56, temperature 98.2 F (36.8 C), temperature source Temporal, resp. rate 17, height 5' 8.11" (1.73 m), weight 168 lb (76.2 kg), SpO2 95 %. Body mass index is 25.46 kg/m.   Physical Exam:  Physical Exam Constitutional:      Appearance: He is well-developed.     Comments: Has hearing aids in place.  HENT:     Head: Normocephalic and atraumatic.     Right Ear: Tympanic membrane, ear canal and external ear normal.     Left Ear: Tympanic membrane, ear canal and external ear normal.     Nose: No nasal deformity, septal deviation, mucosal edema, rhinorrhea or epistaxis.     Right Turbinates: Not enlarged or swollen.     Left Turbinates: Not enlarged or swollen.     Right Sinus: No maxillary sinus tenderness or frontal sinus tenderness.     Left Sinus: No maxillary sinus tenderness or frontal sinus tenderness.     Mouth/Throat:     Mouth: Oropharynx is clear and moist. Mucous membranes are not pale and not dry.     Pharynx: Uvula midline.  Eyes:     General:        Right eye: No discharge.        Left eye: No discharge.     Extraocular Movements: EOM normal.     Conjunctiva/sclera: Conjunctivae normal.     Right eye: Right conjunctiva is not injected. No chemosis.    Left eye: Left conjunctiva is not injected. No chemosis.    Pupils: Pupils are equal, round, and reactive to light.  Cardiovascular:     Rate and Rhythm: Normal rate and regular rhythm.     Heart sounds: Normal heart sounds.  Pulmonary:     Effort: Pulmonary effort is normal. No tachypnea, accessory muscle usage or respiratory distress.     Breath sounds: Normal breath sounds. No wheezing, rhonchi or rales.  Chest:     Chest wall: No tenderness.  Lymphadenopathy:     Cervical: No cervical adenopathy.  Skin:     Coloration: Skin is not pale.     Findings: No abrasion, erythema, petechiae or rash. Rash is not papular, urticarial or vesicular.  Neurological:     Mental Status: He is alert.  Psychiatric:        Mood and Affect: Mood and affect normal.        Behavior: Behavior is cooperative.      Diagnostic studies: none     Salvatore Marvel, MD  [facility]

## 2020-04-13 NOTE — Progress Notes (Signed)
[facility] Follow up:    [name], MD [address] [place] [address]   DIAGNOSIS: Secondary polycythemia  CURRENT THERAPY: Observation  INTERVAL HISTORY: Thomas [name] 85 y.o. male was called for telephone visit for secondary polycythemia.  Patient reports he is doing well since his last visit.  He admits to an occasional headache. He denies any aqua genic pruritus.  He denies any vasomotor symptoms. Denies any nausea, vomiting, or diarrhea. Denies any new pains. Had not noticed any recent bleeding such as epistaxis, hematuria or hematochezia. Denies recent chest pain on exertion, shortness of breath on minimal exertion, pre-syncopal episodes, or palpitations. Denies any numbness or tingling in hands or feet. Denies any recent fevers, infections, or recent hospitalizations. Patient reports appetite at 75% and energy level at 75%.  He is eating well maintain his weight at this time.    Patient Active Problem List   Diagnosis Date Noted  . PAC (premature atrial contraction) 01/11/2018  . Inguinal hernia 04/26/2016  . Abnormal CT of the abdomen 02/21/2016  . Hyperlipemia 09/14/2015  . Chest pain, rule out acute myocardial infarction 02/04/2015  . Chest pain 02/04/2015  . Polycythemia, secondary 09/11/2013  . Allergic rhinitis 09/11/2013  . Hypothyroidism 06/01/2013  . Reactive airway disease 08/29/2012  . Thyroid nodule 08/29/2012  . Chest pain, musculoskeletal 05/07/2011  . GERD (gastroesophageal reflux disease) 05/07/2011  . HTN (hypertension), benign 12/09/2009  . Essential hypertension 12/09/2009  . CORONARY ATHEROSCLEROSIS NATIVE CORONARY ARTERY 12/09/2009  . ABNORMAL ELECTROCARDIOGRAM 12/09/2009    is allergic to penicillins.  MEDICAL HISTORY: Past Medical History:  Diagnosis Date  . Arthritis    "my entire back is gone"  . Asthma   . GERD (gastroesophageal reflux disease)   . H/O hiatal hernia   . Hernia   . History of  stress test 2011  . Hypertension   . Hypothyroidism   . Kidney stones   . Pollen allergies   . Polycythemia   . Polycythemia, secondary 09/11/2013  . Renal calculus    some removed by Cystoscopy, one passed spontaneously  . Sleep apnea    mild no cpap    SURGICAL HISTORY: Past Surgical History:  Procedure Laterality Date  . APPENDECTOMY     50 years ago  . BACK SURGERY     L-4 , L-5  patient states that it was years ago.  Marland Kitchen COLONOSCOPY     at age 29  . COLONOSCOPY N/A 04/06/2016   Procedure: COLONOSCOPY;  Surgeon: Rogene Houston, MD;  Location: [facility];  Service: Endoscopy;  Laterality: N/A;  7:30  . EYE SURGERY     cataracts removed- bilateral- /w IOL  . INGUINAL HERNIA REPAIR Left 05/14/2016   Procedure: OPEN REPAIR OF SYMPTOMATIC (PAINFUL) LEFT INGUINAL HERNIA WITH MESH;  Surgeon: Vickie Epley, MD;  Location: [facility];  Service: General;  Laterality: Left;  . SINOSCOPY    . THYROIDECTOMY Right 09/17/2012   Procedure: RIGHT HEMI-THYROIDECTOMY;  Surgeon: Ascencion Dike, MD;  Location: [facility];  Service: ENT;  Laterality: Right;  . UPPER GASTROINTESTINAL ENDOSCOPY     egd/ed  . YAG LASER APPLICATION Left [id number]   Procedure: YAG LASER APPLICATION;  Surgeon: Rutherford Guys, MD;  Location: [facility];  Service: Ophthalmology;  Laterality: Left;    SOCIAL HISTORY: Social History   Socioeconomic History  . Marital status: Widowed    Spouse name: Not on file  . Number of children:  Not on file  . Years of education: Not on file  . Highest education level: Not on file  Occupational History  . Not on file  Tobacco Use  . Smoking status: Never Smoker  . Smokeless tobacco: Current User    Types: Chew  . Tobacco comment: Patient states that he has been chewing for 60 plus years  Substance and Sexual Activity  . Alcohol use: No    Comment: Patient states that he drinks a bout a case of beer a year  . Drug use: No  . Sexual activity: Never  Other Topics Concern  . Not on  file  Social History Narrative  . Not on file   Social Determinants of Health   Financial Resource Strain: Not on file  Food Insecurity: Not on file  Transportation Needs: Not on file  Physical Activity: Not on file  Stress: Not on file  Social Connections: Not on file  Intimate Partner Violence: Not on file    FAMILY HISTORY: Family History  Problem Relation Age of Onset  . Heart disease Mother   . Heart disease Father   . Heart disease Brother   . Healthy Son   . Allergic rhinitis Neg Hx   . Angioedema Neg Hx   . Asthma Neg Hx   . Atopy Neg Hx   . Eczema Neg Hx   . Immunodeficiency Neg Hx   . Urticaria Neg Hx     Review of Systems  Neurological: Positive for headaches.  All other systems reviewed and are negative.   Vital signs: -Deferred due to telephone visit  Physical Exam -Deferred due to telephone visit -Patient was alert and oriented over the phone and in no acute distress   LABORATORY DATA:  CBC    Component Value Date/Time   WBC 5.0 04/06/2020 1305   RBC 5.01 04/06/2020 1305   HGB 16.5 04/06/2020 1305   HGB 17.4 10/29/2019 1211   HCT 47.7 04/06/2020 1305   HCT 49.1 10/29/2019 1211   PLT 170 04/06/2020 1305   PLT 148 (L) 10/29/2019 1211   MCV 95.2 04/06/2020 1305   MCV 95 10/29/2019 1211   MCH 32.9 04/06/2020 1305   MCHC 34.6 04/06/2020 1305   RDW 12.6 04/06/2020 1305   RDW 12.6 10/29/2019 1211   LYMPHSABS 1.4 04/06/2020 1305   LYMPHSABS 1.1 10/29/2019 1211   MONOABS 0.3 04/06/2020 1305   EOSABS 0.2 04/06/2020 1305   EOSABS 0.1 10/29/2019 1211   BASOSABS 0.0 04/06/2020 1305   BASOSABS 0.0 10/29/2019 1211    CMP     Component Value Date/Time   NA 138 04/06/2020 1305   NA 140 10/29/2019 1211   K 4.3 04/06/2020 1305   CL 99 04/06/2020 1305   CO2 31 04/06/2020 1305   GLUCOSE 91 04/06/2020 1305   BUN 10 04/06/2020 1305   BUN 11 10/29/2019 1211   CREATININE 1.01 04/06/2020 1305   CREATININE 1.16 08/11/2013 0727   CALCIUM 9.2  04/06/2020 1305   PROT 6.9 04/06/2020 1305   PROT 6.5 10/29/2019 1211   ALBUMIN 4.0 04/06/2020 1305   ALBUMIN 4.3 10/29/2019 1211   AST 33 04/06/2020 1305   ALT 38 04/06/2020 1305   ALKPHOS 54 04/06/2020 1305   BILITOT 1.1 04/06/2020 1305   BILITOT 1.2 10/29/2019 1211   GFRNONAA >60 04/06/2020 1305   GFRAA 82 10/29/2019 1211   All questions were answered to patient's stated satisfaction. Encouraged patient to call with any new concerns or questions before  his next visit to the cancer center and we can certain see him sooner, if needed.     ASSESSMENT and THERAPY PLAN:   1.  Secondary erythrocytosis: -Myeloproliferative disorder work-up was negative for Jak 2 and other mutations. -Last phlebotomy was on June 2018. -He does not have any aqua genic pruritus, erythromelalgia, or other vasomotor symptoms. -After reviewing his medications he is on hydrochlorothiazide.  This could be causing volume depletion and false elevation of his hemoglobin. -Labs done on 04/05/2020 showed hemoglobin 16.5, hematocrit 47.7 and platelets 170. -No intervention needed at this time.  Disposition: -Return to clinic in 6 months with repeat lab work (cbc, cmp) and MD follow-up.  No problem-specific Assessment & Plan notes found for this encounter.   No orders of the defined types were placed in this encounter.  Greater than 50% was spent in counseling and coordination of care with this patient including but not limited to discussion of the relevant topics above (See A&P) including, but not limited to diagnosis and management of acute and chronic medical conditions.   I provided 20 minutes of non face-to-face telephone visit time during this encounter, and > 50% was spent counseling as documented under my assessment & plan.   Mauro Kaufmann, NP 04/13/2020

## 2020-04-13 NOTE — Patient Instructions (Addendum)
1. Moderate persistent asthma, uncomplicated - Lung testing not done today. - We are not going to make any medication changes at this time. - Daily controller medication(s): Symbicort 80/4.48mcg one puff twice daily with spacer - Prior to physical activity: albuterol 2 puffs 10-15 minutes before physical activity. - Rescue medications: albuterol 4 puffs every 4-6 hours as needed - Changes during respiratory infections or worsening symptoms: Increase Symbicort 80/4.61mcg to 2 puffs twice daily for TWO WEEKS. - Asthma control goals:  * Full participation in all desired activities (may need albuterol before activity) * Albuterol use two time or less a week on average (not counting use with activity) * Cough interfering with sleep two time or less a month * Oral steroids no more than once a year * No hospitalizations  2. Seasonal and perennial allergic rhinitis  - We will continue to hold allergy shots. - Continue with Clarinex once daily.   3. Return in about 6 months (around 10/12/2020).    Please inform us of any Emergency Department visits, hospitalizations, or changes in symptoms. Call us before going to the ED for breathing or allergy symptoms since we might be able to fit you in for a sick visit. Feel free to contact us anytime with any questions, problems, or concerns.  It was a pleasure to see you again today!  Websites that have reliable patient information: 1. American Academy of Asthma, Allergy, and Immunology: www.aaaai.org 2. Food Allergy Research and Education (FARE): foodallergy.org 3. Mothers of Asthmatics: http://www.asthmacommunitynetwork.org 4. American College of Allergy, Asthma, and Immunology: www.acaai.org   COVID-19 Vaccine Information can be found at: PodExchange.nl For questions related to vaccine distribution or appointments, please email vaccine@[email or web address] or call 8014694100.     "Like" [place]  on Facebook and Instagram for our latest updates!       Make sure you are registered to vote! If you have moved or changed any of your contact information, you will need to get this updated before voting!  In some cases, you MAY be able to register to vote online: [email or web address]

## 2020-05-03 ENCOUNTER — Other Ambulatory Visit: Payer: Self-pay | Admitting: Family Medicine

## 2020-06-02 ENCOUNTER — Other Ambulatory Visit: Payer: Self-pay | Admitting: Family Medicine

## 2020-06-20 ENCOUNTER — Telehealth: Payer: Self-pay

## 2020-06-20 DIAGNOSIS — I10 Essential (primary) hypertension: Secondary | ICD-10-CM

## 2020-06-20 DIAGNOSIS — E038 Other specified hypothyroidism: Secondary | ICD-10-CM

## 2020-06-20 DIAGNOSIS — E7849 Other hyperlipidemia: Secondary | ICD-10-CM

## 2020-06-20 NOTE — Telephone Encounter (Signed)
Last labs 04/06/20 - folate, cmp, b12, vit d, cbc, lactate dehydrogenase, iron, tibc, ferritin.

## 2020-06-20 NOTE — Telephone Encounter (Signed)
Lipid, met 7, TSH Hyperlipidemia, hypothyroidism, hypertension

## 2020-06-20 NOTE — Telephone Encounter (Signed)
Pt scheduled for 6 month follow on 04/07 does he needs blood work done for this?  Pt call back [phone]

## 2020-06-20 NOTE — Telephone Encounter (Signed)
Lab orders placed and pt is aware 

## 2020-06-30 ENCOUNTER — Other Ambulatory Visit: Payer: Self-pay | Admitting: Family Medicine

## 2020-07-14 DIAGNOSIS — E038 Other specified hypothyroidism: Secondary | ICD-10-CM | POA: Diagnosis not present

## 2020-07-14 DIAGNOSIS — I10 Essential (primary) hypertension: Secondary | ICD-10-CM | POA: Diagnosis not present

## 2020-07-14 DIAGNOSIS — E7849 Other hyperlipidemia: Secondary | ICD-10-CM | POA: Diagnosis not present

## 2020-07-15 LAB — BASIC METABOLIC PANEL
BUN/Creatinine Ratio: 13 (ref 10–24)
BUN: 14 mg/dL (ref 8–27)
CO2: 27 mmol/L (ref 20–29)
Calcium: 9.5 mg/dL (ref 8.6–10.2)
Chloride: 102 mmol/L (ref 96–106)
Creatinine, Ser: 1.12 mg/dL (ref 0.76–1.27)
Glucose: 98 mg/dL (ref 65–99)
Potassium: 4.5 mmol/L (ref 3.5–5.2)
Sodium: 140 mmol/L (ref 134–144)
eGFR: 65 mL/min/{1.73_m2} (ref >59)

## 2020-07-15 LAB — LIPID PANEL
Chol/HDL Ratio: 3.6 ratio (ref 0.0–5.0)
Cholesterol, Total: 160 mg/dL (ref 100–199)
HDL: 44 mg/dL (ref >39)
LDL Chol Calc (NIH): 97 mg/dL (ref 0–99)
Triglycerides: 102 mg/dL (ref 0–149)
VLDL Cholesterol Cal: 19 mg/dL (ref 5–40)

## 2020-07-15 LAB — TSH: TSH: 0.949 u[IU]/mL (ref 0.450–4.500)

## 2020-07-21 ENCOUNTER — Other Ambulatory Visit (HOSPITAL_COMMUNITY): Payer: Self-pay | Admitting: *Deleted

## 2020-07-21 ENCOUNTER — Ambulatory Visit (INDEPENDENT_AMBULATORY_CARE_PROVIDER_SITE_OTHER): Payer: Medicare Other | Admitting: Family Medicine

## 2020-07-21 ENCOUNTER — Other Ambulatory Visit: Payer: Self-pay

## 2020-07-21 ENCOUNTER — Encounter: Payer: Self-pay | Admitting: Family Medicine

## 2020-07-21 VITALS — BP 128/74 | Temp 97.2°F | Wt 165.2 lb

## 2020-07-21 DIAGNOSIS — E038 Other specified hypothyroidism: Secondary | ICD-10-CM | POA: Diagnosis not present

## 2020-07-21 DIAGNOSIS — I25118 Atherosclerotic heart disease of native coronary artery with other forms of angina pectoris: Secondary | ICD-10-CM

## 2020-07-21 DIAGNOSIS — I10 Essential (primary) hypertension: Secondary | ICD-10-CM | POA: Diagnosis not present

## 2020-07-21 DIAGNOSIS — D45 Polycythemia vera: Secondary | ICD-10-CM

## 2020-07-21 DIAGNOSIS — I7300 Raynaud's syndrome without gangrene: Secondary | ICD-10-CM

## 2020-07-21 DIAGNOSIS — J4540 Moderate persistent asthma, uncomplicated: Secondary | ICD-10-CM | POA: Diagnosis not present

## 2020-07-21 DIAGNOSIS — J432 Centrilobular emphysema: Secondary | ICD-10-CM

## 2020-07-21 MED ORDER — PRAVASTATIN SODIUM 40 MG PO TABS: 40.0000 mg | ORAL_TABLET | Freq: Every day | ORAL | 1 refills | Status: DC

## 2020-07-21 MED ORDER — GABAPENTIN 300 MG PO CAPS: ORAL_CAPSULE | ORAL | 5 refills | Status: DC

## 2020-07-21 MED ORDER — LEVOTHYROXINE SODIUM 100 MCG PO TABS: ORAL_TABLET | ORAL | 1 refills | Status: DC

## 2020-07-21 MED ORDER — VALSARTAN 80 MG PO TABS: 80.0000 mg | ORAL_TABLET | Freq: Every day | ORAL | 5 refills | Status: DC

## 2020-07-21 MED ORDER — KETOCONAZOLE 2 % EX CREA: TOPICAL_CREAM | CUTANEOUS | 2 refills | Status: DC

## 2020-07-21 MED ORDER — MOMETASONE FUROATE 0.1 % EX CREA: TOPICAL_CREAM | CUTANEOUS | 3 refills | Status: DC

## 2020-07-21 NOTE — Progress Notes (Signed)
   Subjective:    Patient ID: Thomas [name], male    DOB: 15-Oct-1934, 86 y.o.   MRN: [id number]  Hypertension This is a chronic problem. There are no compliance problems.     pt states his right hand will become colder than left hand and his fingers turns dark.  Eating okay appetite okay bowel movements good energy level overall fairly good   Review of Systems Please see above he does get little short of breath when he pushes himself too much    Objective:   Physical Exam Constitutional:      General: He is not in acute distress.    Appearance: He is well-developed.  HENT:     Head: Normocephalic.  Cardiovascular:     Rate and Rhythm: Normal rate and regular rhythm.     Heart sounds: Normal heart sounds. No murmur heard.   Pulmonary:     Effort: Pulmonary effort is normal.     Breath sounds: Normal breath sounds.  Skin:    General: Skin is warm and dry.  Neurological:     Mental Status: He is alert.  Psychiatric:        Behavior: Behavior normal.           Assessment & Plan:  Labs ordered Recent labs including thyroid kidney function and cholesterol reviewed with patient 1. Raynaud's phenomenon without gangrene This has rainouts phenomenon to it.  Pulses at the radial pulse seem to be okay.  With occlusion hand gradually gets red again.  But I find no obvious blood flow issues.  I would recommend he try to keep his hands warm.  2. HTN (hypertension), benign Blood pressure overall good continue current measures  3. Atherosclerosis of native coronary artery of native heart with stable angina pectoris ([place]) Continue cholesterol medicine get LDL below 70  4. Moderate persistent reactive airway disease without complication Follows with asthma doctor on a regular basis  5. Other specified hypothyroidism Thyroid good control continue current measures  6. Centrilobular emphysema (HCC) Continue current measures no longer smokes  Follow-up to recheck increase  statin as well as his Raynauds in 6 to 8 weeks

## 2020-08-15 ENCOUNTER — Other Ambulatory Visit: Payer: Self-pay | Admitting: Family Medicine

## 2020-08-19 ENCOUNTER — Ambulatory Visit (INDEPENDENT_AMBULATORY_CARE_PROVIDER_SITE_OTHER): Payer: Medicare Other | Admitting: Family Medicine

## 2020-08-19 ENCOUNTER — Encounter: Payer: Self-pay | Admitting: Family Medicine

## 2020-08-19 ENCOUNTER — Other Ambulatory Visit: Payer: Self-pay

## 2020-08-19 VITALS — BP 128/68 | Ht 68.0 in | Wt 166.7 lb

## 2020-08-19 DIAGNOSIS — I10 Essential (primary) hypertension: Secondary | ICD-10-CM | POA: Diagnosis not present

## 2020-08-19 MED ORDER — GABAPENTIN 300 MG PO CAPS: ORAL_CAPSULE | ORAL | 5 refills | Status: DC

## 2020-08-19 MED ORDER — GABAPENTIN 100 MG PO CAPS: ORAL_CAPSULE | ORAL | 3 refills | Status: DC

## 2020-08-19 NOTE — Progress Notes (Signed)
   Subjective:    Patient ID: Thomas [name], male    DOB: [birth year], 86 y.o.   MRN: [id number]  Hyperlipidemia This is a new problem. The current episode started more than 1 year ago. Treatments tried: pravachol. There are no compliance problems.    Follow up on blood pressure His blood pressure was dropping with standing and he is getting woozy so we discontinued the diuretic portion of his medicine and brought him in today for recheck  Also he was desiring to get away from gabapentin and we were coaching him on how to do so  Review of Systems Denies any chest tightness pressure pain or shortness of breath or dizziness    Objective:   Physical Exam  Lungs are clear heart regular HEENT benign  Blood pressure sitting standing no appreciable change slight drop with standing    Assessment & Plan:  Orthostasis On last visit we reduced his valsartan from 80/12.5 to plain 80 Blood pressure sitting standing looks good After walking and lab blood pressure still 114/74 No dizziness Continue current measures  Patient is interested in tapering off of gabapentin we will continue to taper off 100 mg at a time per week over the course of the next 8 weeks will be off of the medicine  Recheck by September for wellness and follow-up on routine issues

## 2020-08-27 NOTE — Progress Notes (Signed)
Nurses-patient was having the ends of his fingers go white when they would get cold. (This is called Raynaud's syndrome) I told him that I would touch base with the vascular surgeon regarding this.  Vascular surgery states that this is a typically self-limiting condition that the best approach is to keep the hands warm.  If it starts becoming repetitive there are some medications that we could potentially try.  We can always discuss further on follow-up.  Patient should keep regular follow-up visits.  Thanks-Dr. Nicki Reaper

## 2020-08-29 NOTE — Progress Notes (Signed)
08/29/20-left message to return call

## 2020-08-29 NOTE — Progress Notes (Signed)
Discussed with pt. Pt verbalized understanding.  °

## 2020-10-14 ENCOUNTER — Other Ambulatory Visit: Payer: Self-pay | Admitting: Family Medicine

## 2020-10-18 ENCOUNTER — Other Ambulatory Visit: Payer: Self-pay

## 2020-10-18 ENCOUNTER — Inpatient Hospital Stay (HOSPITAL_COMMUNITY): Payer: Medicare Other | Attending: Hematology

## 2020-10-18 DIAGNOSIS — E039 Hypothyroidism, unspecified: Secondary | ICD-10-CM | POA: Insufficient documentation

## 2020-10-18 DIAGNOSIS — Z87442 Personal history of urinary calculi: Secondary | ICD-10-CM | POA: Insufficient documentation

## 2020-10-18 DIAGNOSIS — Z79899 Other long term (current) drug therapy: Secondary | ICD-10-CM | POA: Insufficient documentation

## 2020-10-18 DIAGNOSIS — D45 Polycythemia vera: Secondary | ICD-10-CM

## 2020-10-18 DIAGNOSIS — D751 Secondary polycythemia: Secondary | ICD-10-CM | POA: Diagnosis not present

## 2020-10-18 DIAGNOSIS — K219 Gastro-esophageal reflux disease without esophagitis: Secondary | ICD-10-CM | POA: Diagnosis not present

## 2020-10-18 DIAGNOSIS — G4730 Sleep apnea, unspecified: Secondary | ICD-10-CM | POA: Insufficient documentation

## 2020-10-18 DIAGNOSIS — I10 Essential (primary) hypertension: Secondary | ICD-10-CM | POA: Diagnosis not present

## 2020-10-18 LAB — COMPREHENSIVE METABOLIC PANEL
ALT: 28 U/L (ref 0–44)
AST: 28 U/L (ref 15–41)
Albumin: 3.9 g/dL (ref 3.5–5.0)
Alkaline Phosphatase: 59 U/L (ref 38–126)
Anion gap: 6 (ref 5–15)
BUN: 9 mg/dL (ref 8–23)
CO2: 29 mmol/L (ref 22–32)
Calcium: 9 mg/dL (ref 8.9–10.3)
Chloride: 104 mmol/L (ref 98–111)
Creatinine, Ser: 0.9 mg/dL (ref 0.61–1.24)
GFR, Estimated: >60 mL/min (ref >60)
Glucose, Bld: 81 mg/dL (ref 70–99)
Potassium: 4.1 mmol/L (ref 3.5–5.1)
Sodium: 139 mmol/L (ref 135–145)
Total Bilirubin: 1.2 mg/dL (ref 0.3–1.2)
Total Protein: 6.5 g/dL (ref 6.5–8.1)

## 2020-10-18 LAB — CBC WITH DIFFERENTIAL/PLATELET
Abs Immature Granulocytes: 0.01 10*3/uL (ref 0.00–0.07)
Basophils Absolute: 0 10*3/uL (ref 0.0–0.1)
Basophils Relative: 1 %
Eosinophils Absolute: 0.2 10*3/uL (ref 0.0–0.5)
Eosinophils Relative: 4 %
HCT: 45.6 % (ref 39.0–52.0)
Hemoglobin: 15.8 g/dL (ref 13.0–17.0)
Immature Granulocytes: 0 %
Lymphocytes Relative: 21 %
Lymphs Abs: 1 10*3/uL (ref 0.7–4.0)
MCH: 32.9 pg (ref 26.0–34.0)
MCHC: 34.6 g/dL (ref 30.0–36.0)
MCV: 95 fL (ref 80.0–100.0)
Monocytes Absolute: 0.4 10*3/uL (ref 0.1–1.0)
Monocytes Relative: 8 %
Neutro Abs: 3.3 10*3/uL (ref 1.7–7.7)
Neutrophils Relative %: 66 %
Platelets: 147 10*3/uL — ABNORMAL LOW (ref 150–400)
RBC: 4.8 MIL/uL (ref 4.22–5.81)
RDW: 12.5 % (ref 11.5–15.5)
WBC: 5 10*3/uL (ref 4.0–10.5)
nRBC: 0 % (ref 0.0–0.2)

## 2020-10-21 ENCOUNTER — Ambulatory Visit: Payer: Medicare Other | Admitting: Allergy & Immunology

## 2020-10-21 ENCOUNTER — Other Ambulatory Visit: Payer: Self-pay

## 2020-10-21 ENCOUNTER — Encounter: Payer: Self-pay | Admitting: Allergy & Immunology

## 2020-10-21 VITALS — BP 130/80 | HR 52 | Temp 98.4°F | Resp 16 | Ht 67.5 in | Wt 158.8 lb

## 2020-10-21 DIAGNOSIS — J3089 Other allergic rhinitis: Secondary | ICD-10-CM | POA: Diagnosis not present

## 2020-10-21 DIAGNOSIS — J302 Other seasonal allergic rhinitis: Secondary | ICD-10-CM | POA: Diagnosis not present

## 2020-10-21 DIAGNOSIS — J4540 Moderate persistent asthma, uncomplicated: Secondary | ICD-10-CM | POA: Diagnosis not present

## 2020-10-21 NOTE — Patient Instructions (Signed)
1. Moderate persistent asthma, uncomplicated - Lung testing not done today. - We are not going to make any medication changes at this time. - Daily controller medication(s): Symbicort 80/4.73mcg one puff twice daily with spacer - Prior to physical activity: albuterol 2 puffs 10-15 minutes before physical activity. - Rescue medications: albuterol 4 puffs every 4-6 hours as needed - Changes during respiratory infections or worsening symptoms: Increase Symbicort 80/4.32mcg to 2 puffs twice daily for TWO WEEKS. - Asthma control goals:  * Full participation in all desired activities (may need albuterol before activity) * Albuterol use two time or less a week on average (not counting use with activity) * Cough interfering with sleep two time or less a month * Oral steroids no more than once a year * No hospitalizations  2. Seasonal and perennial allergic rhinitis  - We will continue to hold allergy shots. - Continue with Clarinex once daily.   3. Return in about 1 year (around 10/21/2021).    Please inform us of any Emergency Department visits, hospitalizations, or changes in symptoms. Call us before going to the ED for breathing or allergy symptoms since we might be able to fit you in for a sick visit. Feel free to contact us anytime with any questions, problems, or concerns.  It was a pleasure to see you again today!  Websites that have reliable patient information: 1. American Academy of Asthma, Allergy, and Immunology: www.aaaai.org 2. Food Allergy Research and Education (FARE): foodallergy.org 3. Mothers of Asthmatics: http://www.asthmacommunitynetwork.org 4. American College of Allergy, Asthma, and Immunology: www.acaai.org   COVID-19 Vaccine Information can be found at: ShippingScam.co.uk For questions related to vaccine distribution or appointments, please email vaccine@[employer] .com or call [phone].   We realize that  you might be concerned about having an allergic reaction to the COVID19 vaccines. To help with that concern, WE ARE OFFERING THE COVID19 VACCINES IN OUR OFFICE! Ask the front desk for dates!     "Like" [place] on Facebook and Instagram for our latest updates!      A healthy democracy works best when [employer] participate! Make sure you are registered to vote! If you have moved or changed any of your contact information, you will need to get this updated before voting!  In some cases, you MAY be able to register to vote online: [email or web address]

## 2020-10-21 NOTE — Progress Notes (Signed)
FOLLOW UP  Date of Service/Encounter:  10/21/20   Assessment:   Moderate persistent asthma, uncomplicated   Seasonal and perennial allergic rhinitis - on allergen immunotherapy for 10+ years (stopped 2021)   Thomas [name] is doing well with regards to his breathing and allergic rhinitis.  I do not think there is a need for me to see him every 6 months.  Despite the fact that he is on a combined ICS/LABA, he has really been asymptomatic.  He prefers to just remain on this since the medicine is affordable.  I will just see him in 1 year or earlier if needed.  Plan/Recommendations:    1. Moderate persistent asthma, uncomplicated - Lung testing not done today. - We are not going to make any medication changes at this time. - Daily controller medication(s): Symbicort 80/4.9mcg one puff twice daily with spacer - Prior to physical activity: albuterol 2 puffs 10-15 minutes before physical activity. - Rescue medications: albuterol 4 puffs every 4-6 hours as needed - Changes during respiratory infections or worsening symptoms: Increase Symbicort 80/4.46mcg to 2 puffs twice daily for TWO WEEKS. - Asthma control goals:  * Full participation in all desired activities (may need albuterol before activity) * Albuterol use two time or less a week on average (not counting use with activity) * Cough interfering with sleep two time or less a month * Oral steroids no more than once a year * No hospitalizations  2. Seasonal and perennial allergic rhinitis  - We will continue to hold allergy shots. - Continue with Clarinex once daily.   3. Return in about 1 year (around 10/21/2021).   Subjective:   Thomas [name] is a 86 y.o. male presenting today for follow up of  Chief Complaint  Patient presents with   Allergies    A little sneezing and runny eyes but nothing major    Thomas [name] has a history of the following: Patient Active Problem List   Diagnosis Date Noted   Centrilobular  emphysema ([place]) 07/21/2020   PAC (premature atrial contraction) 01/11/2018   Inguinal hernia 04/26/2016   Abnormal CT of the abdomen 02/21/2016   Hyperlipemia 09/14/2015   Chest pain 02/04/2015   Polycythemia, secondary 09/11/2013   Allergic rhinitis 09/11/2013   Hypothyroidism 06/01/2013   Reactive airway disease 08/29/2012   Thyroid nodule 08/29/2012   GERD (gastroesophageal reflux disease) 05/07/2011   HTN (hypertension), benign 12/09/2009   Essential hypertension 12/09/2009   CORONARY ATHEROSCLEROSIS NATIVE CORONARY ARTERY 12/09/2009   ABNORMAL ELECTROCARDIOGRAM 12/09/2009    History obtained from: chart review and patient.  Thomas [name] is a 86 y.o. male presenting for a follow up visit.  He was last seen in December 2021.  At that time, his asthma was under good control with Symbicort 80 mcg 1 puff twice daily.  We continued with albuterol as needed.  For his allergic rhinitis, we kept him off of his allergy shots.  We continue with Clarinex 1 tablet once daily.  Since last visit, he has done very well.  Asthma/Respiratory Symptom History: He remains on the Symbicort 2 puffs twice daily.  He has not needed to use his rescue inhaler much at all.  He has not been to the emergency room nor has he needed systemic steroids for his asthma.  He reports sleeping well at night with regards to his breathing.  Allergic Rhinitis Symptom History: He remains on the Clarinex once daily.  This is controlling his symptoms well.  He has not  needed antibiotics.  He does have a history of secondary polycythemia vera as well as Raynaud's phenomenon.  He has had a work-up including JAK2 mutations which were negative.  He has required phlebotomies in the past, last done in June 2018.  Otherwise, there have been no changes to his past medical history, surgical history, family history, or social history.    Review of Systems  Constitutional: Negative.  Negative for chills, fever, malaise/fatigue and  weight loss.  HENT:  Negative for congestion, ear discharge, ear pain and sinus pain.   Eyes:  Negative for pain, discharge and redness.  Respiratory:  Negative for cough, sputum production, shortness of breath and wheezing.   Cardiovascular: Negative.  Negative for chest pain and palpitations.  Gastrointestinal:  Negative for abdominal pain, constipation, diarrhea, heartburn, nausea and vomiting.  Skin: Negative.  Negative for itching and rash.  Neurological:  Negative for dizziness and headaches.  Endo/Heme/Allergies:  Negative for environmental allergies. Does not bruise/bleed easily.      Objective:   Blood pressure 130/80, pulse (!) 52, temperature 98.4 F (36.9 C), temperature source Temporal, resp. rate 16, height 5' 7.5" (1.715 m), weight 158 lb 12.8 oz (72 kg), SpO2 98 %. Body mass index is 24.5 kg/m.   Physical Exam:  Physical Exam Constitutional:      Appearance: He is well-developed.     Comments: Very friendly.  Hard of hearing.  HENT:     Head: Normocephalic and atraumatic.     Right Ear: Tympanic membrane, ear canal and external ear normal.     Left Ear: Tympanic membrane, ear canal and external ear normal.     Nose: No nasal deformity, septal deviation, mucosal edema or rhinorrhea.     Right Turbinates: Enlarged and swollen.     Left Turbinates: Enlarged and swollen.     Right Sinus: No maxillary sinus tenderness or frontal sinus tenderness.     Left Sinus: No maxillary sinus tenderness or frontal sinus tenderness.     Mouth/Throat:     Mouth: Mucous membranes are not pale and not dry.     Pharynx: Uvula midline.  Eyes:     General: Lids are normal. No allergic shiner.       Right eye: No discharge.        Left eye: No discharge.     Conjunctiva/sclera: Conjunctivae normal.     Right eye: Right conjunctiva is not injected. No chemosis.    Left eye: Left conjunctiva is not injected. No chemosis.    Pupils: Pupils are equal, round, and reactive to light.   Cardiovascular:     Rate and Rhythm: Normal rate and regular rhythm.     Heart sounds: Normal heart sounds.  Pulmonary:     Effort: Pulmonary effort is normal. No tachypnea, accessory muscle usage or respiratory distress.     Breath sounds: Normal breath sounds. No wheezing, rhonchi or rales.     Comments: Moving air well in all lung fields.  No increased work of breathing. Chest:     Chest wall: No tenderness.  Lymphadenopathy:     Cervical: No cervical adenopathy.  Skin:    Coloration: Skin is not pale.     Findings: No abrasion, erythema, petechiae or rash. Rash is not papular, urticarial or vesicular.  Neurological:     Mental Status: He is alert.  Psychiatric:        Behavior: Behavior is cooperative.     Diagnostic studies: none  Salvatore Marvel, MD  [facility]

## 2020-10-24 NOTE — Progress Notes (Signed)
Thomas [name], [place] [address]   CLINIC:  Medical Oncology/Hematology  PCP:  [name], MD [address] [place] [address] [phone]   REASON FOR VISIT:  Follow-up for secondary polycythemia  PRIOR THERAPY: Intermittent phlebotomy  CURRENT THERAPY: Observation  INTERVAL HISTORY:  Thomas [name] 86 y.o. male returns for routine follow-up of his secondary polycythemia.  He was last evaluated by NP Faythe Casa on 04/13/2020 via telephone visit.  At today's visit, he reports feeling well.  No recent hospitalizations, surgeries, or changes in baseline health status.  He continues to use chewing tobacco on a daily basis, as he has for the past 60+ years. No current or previous blood clots such as DVT or PE. He denies erythromelalgia, aquagenic pruritus, or vasomotor symptoms.  No frequent headaches, blurry vision, or tinnitus. He does have chronic peripheral neuropathy which is unchanged and stable at baseline.   No fatigue, fever, chills, shortness of breath, cough, chest pain, nausea, vomiting, abdominal pain.  Denies any signs or symptoms of blood loss.    He has 100% energy and 100% appetite. He endorses that he is maintaining a stable weight.    REVIEW OF SYSTEMS:  Review of Systems  Constitutional:  Negative for appetite change, chills, diaphoresis, fatigue, fever and unexpected weight change.  HENT:   Negative for lump/mass and nosebleeds.   Eyes:  Negative for eye problems.  Respiratory:  Negative for cough, hemoptysis and shortness of breath.   Cardiovascular:  Negative for chest pain, leg swelling and palpitations.  Gastrointestinal:  Negative for abdominal pain, blood in stool, constipation, diarrhea, nausea and vomiting.  Genitourinary:  Negative for hematuria.   Skin: Negative.   Neurological:  Positive for numbness. Negative for dizziness, headaches and light-headedness.  Hematological:  Does not  bruise/bleed easily.  Psychiatric/Behavioral:  Positive for sleep disturbance.      PAST MEDICAL/SURGICAL HISTORY:  Past Medical History:  Diagnosis Date   Arthritis    "my entire back is gone"   Asthma    GERD (gastroesophageal reflux disease)    H/O hiatal hernia    Hernia    History of stress test 2011   Hypertension    Hypothyroidism    Kidney stones    Pollen allergies    Polycythemia    Polycythemia, secondary 09/11/2013   Renal calculus    some removed by Cystoscopy, one passed spontaneously   Sleep apnea    mild no cpap   Past Surgical History:  Procedure Laterality Date   APPENDECTOMY     50 years ago   BACK SURGERY     L-4 , L-5  patient states that it was years ago.   COLONOSCOPY     at age 25   COLONOSCOPY N/A 04/06/2016   Procedure: COLONOSCOPY;  Surgeon: Rogene Houston, MD;  Location: [facility];  Service: Endoscopy;  Laterality: N/A;  7:30   EYE SURGERY     cataracts removed- bilateral- /w IOL   INGUINAL HERNIA REPAIR Left 05/14/2016   Procedure: OPEN REPAIR OF SYMPTOMATIC (PAINFUL) LEFT INGUINAL HERNIA WITH MESH;  Surgeon: Vickie Epley, MD;  Location: [facility];  Service: General;  Laterality: Left;   SINOSCOPY     THYROIDECTOMY Right 09/17/2012   Procedure: RIGHT HEMI-THYROIDECTOMY;  Surgeon: Ascencion Dike, MD;  Location: [facility];  Service: ENT;  Laterality: Right;   UPPER GASTROINTESTINAL ENDOSCOPY     egd/ed   YAG LASER APPLICATION  Left 02/07/2016   Procedure: YAG LASER APPLICATION;  Surgeon: Rutherford Guys, MD;  Location: [facility];  Service: Ophthalmology;  Laterality: Left;     SOCIAL HISTORY:  Social History   Socioeconomic History   Marital status: Widowed    Spouse name: Not on file   Number of children: Not on file   Years of education: Not on file   Highest education level: Not on file  Occupational History   Not on file  Tobacco Use   Smoking status: Never   Smokeless tobacco: Current    Types: Chew   Tobacco comments:    Patient  states that he has been chewing for 60 plus years  Substance and Sexual Activity   Alcohol use: No    Comment: Patient states that he drinks a bout a case of beer a year   Drug use: No   Sexual activity: Never  Other Topics Concern   Not on file  Social History Narrative   Not on file   Social Determinants of Health   Financial Resource Strain: Not on file  Food Insecurity: Not on file  Transportation Needs: Not on file  Physical Activity: Not on file  Stress: Not on file  Social Connections: Not on file  Intimate Partner Violence: Not on file    FAMILY HISTORY:  Family History  Problem Relation Age of Onset   Heart disease Mother    Heart disease Father    Heart disease Brother    Healthy Son    Allergic rhinitis Neg Hx    Angioedema Neg Hx    Asthma Neg Hx    Atopy Neg Hx    Eczema Neg Hx    Immunodeficiency Neg Hx    Urticaria Neg Hx     CURRENT MEDICATIONS:  Outpatient Encounter Medications as of 10/25/2020  Medication Sig   albuterol (PROVENTIL HFA;VENTOLIN HFA) 108 (90 Base) MCG/ACT inhaler Inhale 2 puffs into the lungs every 4 (four) hours as needed.   budesonide-formoterol (SYMBICORT) 80-4.5 MCG/ACT inhaler Inhale 1 puff into the lungs 2 (two) times daily.   fluticasone (FLONASE) 50 MCG/ACT nasal spray USE 1 SPRAY IN EACH NOSTRIL TWICE DAILY.   Hypromellose (ARTIFICIAL TEARS OP) Place 1 drop into both eyes daily as needed (dry eyes).    ibuprofen (ADVIL,MOTRIN) 200 MG tablet Take 400 mg by mouth 2 (two) times daily as needed for headache or moderate pain.   ketoconazole (NIZORAL) 2 % cream APPLY TO AFFECTED AREA TWO TIMES DAILY AS NEEDED FOR IRRITATION.   levothyroxine (SYNTHROID) 100 MCG tablet TAKE ONE TABLET BY MOUTH DAILY BEFORE BREAKFAST.   mometasone (ELOCON) 0.1 % cream Apply bid prn   omeprazole (PRILOSEC) 20 MG capsule TAKE (1) CAPSULE BY MOUTH ONCE DAILY.   ondansetron (ZOFRAN) 8 MG tablet Take 1 tablet (8 mg total) by mouth every 8 (eight) hours as  needed for nausea.   pravastatin (PRAVACHOL) 40 MG tablet Take 1 tablet (40 mg total) by mouth at bedtime.   SYMBICORT 80-4.5 MCG/ACT inhaler Inhale 2 puffs into the lungs 2 (two) times daily.   triamcinolone cream (KENALOG) 0.1 % Apply 1 application topically 2 (two) times daily.   valsartan (DIOVAN) 80 MG tablet Take 1 tablet (80 mg total) by mouth daily.   No facility-administered encounter medications on file as of 10/25/2020.    ALLERGIES:  Allergies  Allergen Reactions   Penicillins Other (See Comments)    Reaction many years ago-unknown reaction Has patient had a PCN reaction  causing immediate rash, facial/tongue/throat swelling, SOB or lightheadedness with hypotension: Unknown Has patient had a PCN reaction causing severe rash involving mucus membranes or skin necrosis: Unknown Has patient had a PCN reaction that required hospitalization Unknown Has patient had a PCN reaction occurring within the last 10 years: No If all of the above answers are "NO", then may proceed with Cephalosporin use.      PHYSICAL EXAM:  ECOG PERFORMANCE STATUS: 0 - Asymptomatic  There were no vitals filed for this visit. There were no vitals filed for this visit. Physical Exam Constitutional:      Appearance: Normal appearance.  HENT:     Head: Normocephalic and atraumatic.     Mouth/Throat:     Mouth: Mucous membranes are moist.  Eyes:     Extraocular Movements: Extraocular movements intact.     Pupils: Pupils are equal, round, and reactive to light.  Cardiovascular:     Rate and Rhythm: Normal rate and regular rhythm.     Pulses: Normal pulses.     Heart sounds: Normal heart sounds.  Pulmonary:     Effort: Pulmonary effort is normal.     Breath sounds: Normal breath sounds.  Abdominal:     General: Bowel sounds are normal.     Palpations: Abdomen is soft.     Tenderness: There is no abdominal tenderness.  Musculoskeletal:        General: No swelling.     Right lower leg: No edema.      Left lower leg: No edema.  Lymphadenopathy:     Cervical: No cervical adenopathy.  Skin:    General: Skin is warm and dry.  Neurological:     General: No focal deficit present.     Mental Status: He is alert and oriented to person, place, and time.  Psychiatric:        Mood and Affect: Mood normal.        Behavior: Behavior normal.     LABORATORY DATA:  I have reviewed the labs as listed.  CBC    Component Value Date/Time   WBC 5.0 10/18/2020 1258   RBC 4.80 10/18/2020 1258   HGB 15.8 10/18/2020 1258   HGB 17.4 10/29/2019 1211   HCT 45.6 10/18/2020 1258   HCT 49.1 10/29/2019 1211   PLT 147 (L) 10/18/2020 1258   PLT 148 (L) 10/29/2019 1211   MCV 95.0 10/18/2020 1258   MCV 95 10/29/2019 1211   MCH 32.9 10/18/2020 1258   MCHC 34.6 10/18/2020 1258   RDW 12.5 10/18/2020 1258   RDW 12.6 10/29/2019 1211   LYMPHSABS 1.0 10/18/2020 1258   LYMPHSABS 1.1 10/29/2019 1211   MONOABS 0.4 10/18/2020 1258   EOSABS 0.2 10/18/2020 1258   EOSABS 0.1 10/29/2019 1211   BASOSABS 0.0 10/18/2020 1258   BASOSABS 0.0 10/29/2019 1211   CMP Latest Ref Rng & Units 10/18/2020 07/14/2020 04/06/2020  Glucose 70 - 99 mg/dL 81 98 91  BUN 8 - 23 mg/dL 9 14 10   Creatinine 0.61 - 1.24 mg/dL 0.90 1.12 1.01  Sodium 135 - 145 mmol/L 139 140 138  Potassium 3.5 - 5.1 mmol/L 4.1 4.5 4.3  Chloride 98 - 111 mmol/L 104 102 99  CO2 22 - 32 mmol/L 29 27 31   Calcium 8.9 - 10.3 mg/dL 9.0 9.5 9.2  Total Protein 6.5 - 8.1 g/dL 6.5 - 6.9  Total Bilirubin 0.3 - 1.2 mg/dL 1.2 - 1.1  Alkaline Phos 38 - 126 U/L 59 - 54  AST 15 - 41 U/L 28 - 33  ALT 0 - 44 U/L 28 - 38    DIAGNOSTIC IMAGING:  I have independently reviewed the relevant imaging and discussed with the patient.  ASSESSMENT & PLAN: 1.  Secondary polycythemia: - MPN work-up was negative for JAK2, CAL R, MPL mutation - Erythropoietin level was normal - Last phlebotomy was on June 2018. - He does not have any aquagenic pruritus, erythromelalgia, or  other vasomotor symptoms. - Patient was previously on hydrochlorothiazide, which could have been causing volume depletion and false elevation of his hemoglobin. - Reports that he has been using chewing tobacco for 60+ years.  Denies smoking tobacco. -We will consider therapeutic phlebotomy and secondary polycythemia with severe symptoms which include strokelike symptoms, severe recurrent headaches, severe fatigue or if HCT is greater than 54. - Most recent labs (10/18/2020): Hgb 15.8/HCT 45.6 - PLAN: No indication for treatment at this time.  Hgb/HCT have been stable and within normal limits over the past 12 months.  Patient requests that further follow-up be with his PCP, but that he would be happy to follow-up with [place] again if needed.  This is reasonable given his clinical stability and lack of symptoms.  This has been communicated to his primary care doctor (Dr. Sallee Lange), with request that the patient be referred back to [place] if he has Hgb greater than 17.0 or HCT greater than 52.0.  Patient has also been educated on alarm symptoms that should prompt return to hematology clinic.   PLAN SUMMARY & DISPOSITION: No follow-up planned at this time.  Can follow-up as needed with lab checks every 6 months by PCP.  All questions were answered. The patient knows to call the clinic with any problems, questions or concerns.  Medical decision making: Low  Time spent on visit: I spent 15 minutes counseling the patient face to face. The total time spent in the appointment was 25 minutes and more than 50% was on counseling.   Harriett Rush, PA-C  10/25/2020 1:34 PM

## 2020-10-25 ENCOUNTER — Inpatient Hospital Stay (HOSPITAL_BASED_OUTPATIENT_CLINIC_OR_DEPARTMENT_OTHER): Payer: Medicare Other | Admitting: Physician Assistant

## 2020-10-25 ENCOUNTER — Other Ambulatory Visit: Payer: Self-pay

## 2020-10-25 VITALS — BP 131/71 | HR 57 | Temp 96.9°F | Resp 18 | Wt 161.9 lb

## 2020-10-25 DIAGNOSIS — I10 Essential (primary) hypertension: Secondary | ICD-10-CM | POA: Diagnosis not present

## 2020-10-25 DIAGNOSIS — G4730 Sleep apnea, unspecified: Secondary | ICD-10-CM | POA: Diagnosis not present

## 2020-10-25 DIAGNOSIS — K219 Gastro-esophageal reflux disease without esophagitis: Secondary | ICD-10-CM | POA: Diagnosis not present

## 2020-10-25 DIAGNOSIS — D751 Secondary polycythemia: Secondary | ICD-10-CM | POA: Diagnosis not present

## 2020-10-25 DIAGNOSIS — Z79899 Other long term (current) drug therapy: Secondary | ICD-10-CM | POA: Diagnosis not present

## 2020-10-25 DIAGNOSIS — Z87442 Personal history of urinary calculi: Secondary | ICD-10-CM | POA: Diagnosis not present

## 2020-10-25 DIAGNOSIS — E039 Hypothyroidism, unspecified: Secondary | ICD-10-CM | POA: Diagnosis not present

## 2020-10-25 NOTE — Patient Instructions (Addendum)
[place] at [facility] Discharge Instructions  You were seen today by Tarri Abernethy PA-C for your secondary polycythemia (elevated hemoglobin and red blood cells).  Your blood levels have been stable over the past year, and per your request, you can continue to follow-up with your primary care provider for ongoing lab checks every 6 months.  If your hemoglobin is greater than 17.0 or hematocrit is greater than 52.0, you should be referred back to [place] for another visit.  Other symptoms to be on the look out for (that may indicate increased hemoglobin) are increased/abnormal headaches, blurry vision, ringing in your ears, itching after a hot shower, or burning pain in your hands or feet. You should also notify us immediately if you are at any time diagnosed with a blood clot such as a DVT (deep vein thrombosis) or PE (pulmonary embolism).  We would be happy to see you again at any time.  Just call and let us know if you need to be put on the schedule.             Thank you for choosing [place] at [facility] to provide your oncology and hematology care.  To afford each patient quality time with our provider, please arrive at least 15 minutes before your scheduled appointment time.   If you have a lab appointment with the [facility] please come in thru the Main Entrance and check in at the main information desk.  You need to re-schedule your appointment should you arrive 10 or more minutes late.  We strive to give you quality time with our providers, and arriving late affects you and other patients whose appointments are after yours.  Also, if you no show three or more times for appointments you may be dismissed from the clinic at the providers discretion.     Again, thank you for choosing [facility].  Our hope is that these requests will decrease the amount of time that you wait before being seen by our physicians.        _____________________________________________________________  Should you have questions after your visit to [facility], please contact our office at [phone] and follow the prompts.  Our office hours are 8:00 a.m. and 4:30 p.m. Monday - Friday.  Please note that voicemails left after 4:00 p.m. may not be returned until the following business day.  We are closed weekends and major holidays.  You do have access to a nurse 24-7, just call the main number to the clinic [phone] and do not press any options, hold on the line and a nurse will answer the phone.    For prescription refill requests, have your pharmacy contact our office and allow 72 hours.    Due to Covid, you will need to wear a mask upon entering the hospital. If you do not have a mask, a mask will be given to you at the Main Entrance upon arrival. For doctor visits, patients may have 1 support person age 45 or older with them. For treatment visits, patients can not have anyone with them due to social distancing guidelines and our immunocompromised population.

## 2020-11-10 ENCOUNTER — Other Ambulatory Visit: Payer: Self-pay | Admitting: Family Medicine

## 2020-12-24 ENCOUNTER — Other Ambulatory Visit: Payer: Self-pay | Admitting: Family Medicine

## 2020-12-30 ENCOUNTER — Telehealth: Payer: Self-pay | Admitting: Family Medicine

## 2020-12-30 ENCOUNTER — Other Ambulatory Visit: Payer: Self-pay

## 2020-12-30 DIAGNOSIS — T169XXA Foreign body in ear, unspecified ear, initial encounter: Secondary | ICD-10-CM

## 2020-12-30 NOTE — Telephone Encounter (Signed)
Please go ahead with urgent referral to ENT

## 2020-12-30 NOTE — Telephone Encounter (Signed)
Patient called in stating that while he was mowing yesterday a bug flew in his ear. He has made an appt her on Tuesday he would like to know if we could refer him to an ENT.  CB# [phone]

## 2020-12-30 NOTE — Telephone Encounter (Signed)
Patient informed per drs orders for urgent referral to ENT

## 2021-01-02 ENCOUNTER — Ambulatory Visit (INDEPENDENT_AMBULATORY_CARE_PROVIDER_SITE_OTHER): Payer: Medicare Other | Admitting: Family Medicine

## 2021-01-02 ENCOUNTER — Other Ambulatory Visit: Payer: Self-pay

## 2021-01-02 VITALS — BP 158/90 | HR 52 | Temp 97.3°F | Wt 160.2 lb

## 2021-01-02 DIAGNOSIS — Z23 Encounter for immunization: Secondary | ICD-10-CM | POA: Diagnosis not present

## 2021-01-02 DIAGNOSIS — Z79899 Other long term (current) drug therapy: Secondary | ICD-10-CM

## 2021-01-02 DIAGNOSIS — I10 Essential (primary) hypertension: Secondary | ICD-10-CM | POA: Diagnosis not present

## 2021-01-02 DIAGNOSIS — E7849 Other hyperlipidemia: Secondary | ICD-10-CM | POA: Diagnosis not present

## 2021-01-02 DIAGNOSIS — D751 Secondary polycythemia: Secondary | ICD-10-CM

## 2021-01-02 DIAGNOSIS — E038 Other specified hypothyroidism: Secondary | ICD-10-CM | POA: Diagnosis not present

## 2021-01-02 MED ORDER — AMLODIPINE BESYLATE 2.5 MG PO TABS: 2.5000 mg | ORAL_TABLET | Freq: Every day | ORAL | 3 refills | Status: DC

## 2021-01-02 NOTE — Progress Notes (Signed)
   Subjective:    Patient ID: Thomas [name], male    DOB: 09/22/1934, 86 y.o.   MRN: [id number]  HPI Pt had insect fly in right ear last Thursday. Pt states that it feels as if insect is still in ear. Pt did use Qtip to try and get insect out.   On today's visit his blood pressure is moderately elevated.  Even on recheck it was elevated.  He is trying to stay relatively healthy with his eating habits.  He relates compliance with his thyroid medicine cholesterol medicine blood pressure medicine  Review of Systems     Objective:   Physical Exam  Ears look good bilateral General-in no acute distress Eyes-no discharge Lungs-respiratory rate normal, CTA CV-no murmurs,RRR Extremities skin warm dry no edema Neuro grossly normal Behavior normal, alert      Assessment & Plan:  1. Secondary polycythemia Patient had CBC this summer looked good we will not repeat that again until by spring time  2. HTN (hypertension), benign Blood pressure moderately elevated add amlodipine 2.5 mg to what he is currently taking recheck the patient in 3 to 4 weeks if any problems notify us - Lipid Profile - TSH - Hepatic function panel - Basic Metabolic Panel (BMET)  3. Other hyperlipidemia Cholesterol profile recommended healthy diet - Lipid Profile - TSH - Hepatic function panel - Basic Metabolic Panel (BMET)  4. Other specified hypothyroidism Continue medication check TSH - Lipid Profile - TSH - Hepatic function panel - Basic Metabolic Panel (BMET)  5. Need for vaccination Flu shot today - Flu Vaccine QUAD High Dose(Fluad)  6. High risk medication use Lab work before wellness visit in several weeks - Lipid Profile - TSH - Hepatic function panel - Basic Metabolic Panel (BMET)  No foreign body noted in the ears  Wellness exam recommended on follow-up

## 2021-01-03 ENCOUNTER — Ambulatory Visit: Payer: Medicare Other | Admitting: Family Medicine

## 2021-01-11 ENCOUNTER — Other Ambulatory Visit: Payer: Self-pay | Admitting: Family Medicine

## 2021-01-17 ENCOUNTER — Other Ambulatory Visit: Payer: Self-pay | Admitting: Family Medicine

## 2021-01-24 DIAGNOSIS — I10 Essential (primary) hypertension: Secondary | ICD-10-CM | POA: Diagnosis not present

## 2021-01-24 DIAGNOSIS — Z79899 Other long term (current) drug therapy: Secondary | ICD-10-CM | POA: Diagnosis not present

## 2021-01-24 DIAGNOSIS — E038 Other specified hypothyroidism: Secondary | ICD-10-CM | POA: Diagnosis not present

## 2021-01-24 DIAGNOSIS — E7849 Other hyperlipidemia: Secondary | ICD-10-CM | POA: Diagnosis not present

## 2021-01-25 LAB — TSH: TSH: 0.201 u[IU]/mL — ABNORMAL LOW (ref 0.450–4.500)

## 2021-01-25 LAB — LIPID PANEL
Chol/HDL Ratio: 3.3 ratio (ref 0.0–5.0)
Cholesterol, Total: 145 mg/dL (ref 100–199)
HDL: 44 mg/dL (ref >39)
LDL Chol Calc (NIH): 78 mg/dL (ref 0–99)
Triglycerides: 128 mg/dL (ref 0–149)
VLDL Cholesterol Cal: 23 mg/dL (ref 5–40)

## 2021-01-25 LAB — HEPATIC FUNCTION PANEL
ALT: 34 IU/L (ref 0–44)
AST: 30 IU/L (ref 0–40)
Albumin: 4.5 g/dL (ref 3.6–4.6)
Alkaline Phosphatase: 80 IU/L (ref 44–121)
Bilirubin Total: 1.1 mg/dL (ref 0.0–1.2)
Bilirubin, Direct: 0.27 mg/dL (ref 0.00–0.40)
Total Protein: 6.5 g/dL (ref 6.0–8.5)

## 2021-01-25 LAB — BASIC METABOLIC PANEL
BUN/Creatinine Ratio: 9 — ABNORMAL LOW (ref 10–24)
BUN: 8 mg/dL (ref 8–27)
CO2: 27 mmol/L (ref 20–29)
Calcium: 9.5 mg/dL (ref 8.6–10.2)
Chloride: 102 mmol/L (ref 96–106)
Creatinine, Ser: 0.94 mg/dL (ref 0.76–1.27)
Glucose: 91 mg/dL (ref 70–99)
Potassium: 4.2 mmol/L (ref 3.5–5.2)
Sodium: 140 mmol/L (ref 134–144)
eGFR: 80 mL/min/{1.73_m2} (ref >59)

## 2021-01-30 ENCOUNTER — Encounter: Payer: Self-pay | Admitting: Family Medicine

## 2021-01-30 ENCOUNTER — Other Ambulatory Visit: Payer: Self-pay

## 2021-01-30 ENCOUNTER — Ambulatory Visit (INDEPENDENT_AMBULATORY_CARE_PROVIDER_SITE_OTHER): Payer: Medicare Other | Admitting: Family Medicine

## 2021-01-30 VITALS — BP 135/72 | HR 54 | Temp 97.2°F | Ht 67.5 in | Wt 162.0 lb

## 2021-01-30 DIAGNOSIS — E038 Other specified hypothyroidism: Secondary | ICD-10-CM

## 2021-01-30 DIAGNOSIS — D751 Secondary polycythemia: Secondary | ICD-10-CM

## 2021-01-30 DIAGNOSIS — Z0000 Encounter for general adult medical examination without abnormal findings: Secondary | ICD-10-CM

## 2021-01-30 MED ORDER — AMLODIPINE BESYLATE 2.5 MG PO TABS: 2.5000 mg | ORAL_TABLET | Freq: Every day | ORAL | 1 refills | Status: DC

## 2021-01-30 MED ORDER — PRAVASTATIN SODIUM 40 MG PO TABS: 40.0000 mg | ORAL_TABLET | Freq: Every day | ORAL | 1 refills | Status: DC

## 2021-01-30 MED ORDER — VALSARTAN 80 MG PO TABS: ORAL_TABLET | ORAL | 1 refills | Status: DC

## 2021-01-30 NOTE — Patient Instructions (Signed)
Patient was instructed to reduce thyroid medicine to half a tablet once a week otherwise 1 tablet every day Do lab work in December Encourage patient to do COVID booster

## 2021-01-30 NOTE — Progress Notes (Signed)
   Subjective:    Patient ID: Thomas [name], male    DOB: Mar 23, 1935, 86 y.o.   MRN: [id number]  HPI AWV- Annual Wellness Visit  The patient was seen for their annual wellness visit. The patient's past medical history, surgical history, and family history were reviewed. Pertinent vaccines were reviewed ( tetanus, pneumonia, shingles, flu) The patient's medication list was reviewed and updated.  The height and weight were entered.  BMI recorded in electronic record elsewhere  Cognitive screening was completed. Outcome of Mini - Cog: Passes   Falls /depression screening electronically recorded within record elsewhere  Current tobacco usage:no (All patients who use tobacco were given written and verbal information on quitting)  Recent listing of emergency department/hospitalizations over the past year were reviewed.  current specialist the patient sees on a regular basis: yes- work at home    Medicare annual wellness visit patient questionnaire was reviewed.  A written screening schedule for the patient for the next 5-10 years was given. Appropriate discussion of followup regarding next visit was discussed.      Review of Systems     Objective:   Physical Exam  General-in no acute distress Eyes-no discharge Lungs-respiratory rate normal, CTA CV-no murmurs,RRR Extremities skin warm dry no edema Neuro grossly normal Behavior normal, alert       Assessment & Plan:  1. Encounter for subsequent annual wellness visit (AWV) in Medicare patient Adult wellness-complete.wellness physical was conducted today. Importance of diet and exercise were discussed in detail.  In addition to this a discussion regarding safety was also covered. We also reviewed over immunizations and gave recommendations regarding current immunization needed for age.  In addition to this additional areas were also touched on including: Preventative health exams needed:  Colonoscopy not  indicated  Patient was advised yearly wellness exam  - TSH - CBC with Differential/Platelet  2. Polycythemia, secondary Check CBC on next blood draw in approximately 2 to 3 months - TSH - CBC with Differential/Platelet  3. Other specified hypothyroidism Reduce thyroid medicine to half a tablet 1 day a week and repeat TSH and 8 to 12 weeks - TSH - CBC with Differential/Platelet  Follow-up office visit by March

## 2021-02-25 ENCOUNTER — Other Ambulatory Visit: Payer: Self-pay | Admitting: Family Medicine

## 2021-03-20 ENCOUNTER — Other Ambulatory Visit: Payer: Self-pay | Admitting: Family Medicine

## 2021-03-20 ENCOUNTER — Other Ambulatory Visit: Payer: Self-pay | Admitting: Allergy & Immunology

## 2021-04-05 DIAGNOSIS — E038 Other specified hypothyroidism: Secondary | ICD-10-CM | POA: Diagnosis not present

## 2021-04-05 DIAGNOSIS — D751 Secondary polycythemia: Secondary | ICD-10-CM | POA: Diagnosis not present

## 2021-04-06 ENCOUNTER — Encounter: Payer: Self-pay | Admitting: Family Medicine

## 2021-04-06 LAB — CBC WITH DIFFERENTIAL/PLATELET
Basophils Absolute: 0 10*3/uL (ref 0.0–0.2)
Basos: 1 %
EOS (ABSOLUTE): 0.2 10*3/uL (ref 0.0–0.4)
Eos: 5 %
Hematocrit: 49.2 % (ref 37.5–51.0)
Hemoglobin: 16.6 g/dL (ref 13.0–17.7)
Immature Grans (Abs): 0 10*3/uL (ref 0.0–0.1)
Immature Granulocytes: 0 %
Lymphocytes Absolute: 1.2 10*3/uL (ref 0.7–3.1)
Lymphs: 26 %
MCH: 32.3 pg (ref 26.6–33.0)
MCHC: 33.7 g/dL (ref 31.5–35.7)
MCV: 96 fL (ref 79–97)
Monocytes Absolute: 0.4 10*3/uL (ref 0.1–0.9)
Monocytes: 8 %
Neutrophils Absolute: 2.8 10*3/uL (ref 1.4–7.0)
Neutrophils: 60 %
Platelets: 183 10*3/uL (ref 150–450)
RBC: 5.14 x10E6/uL (ref 4.14–5.80)
RDW: 12.8 % (ref 11.6–15.4)
WBC: 4.6 10*3/uL (ref 3.4–10.8)

## 2021-04-06 LAB — TSH: TSH: 1.47 u[IU]/mL (ref 0.450–4.500)

## 2021-04-17 ENCOUNTER — Other Ambulatory Visit: Payer: Self-pay | Admitting: Family Medicine

## 2021-08-01 ENCOUNTER — Ambulatory Visit (INDEPENDENT_AMBULATORY_CARE_PROVIDER_SITE_OTHER): Payer: Medicare Other | Admitting: Family Medicine

## 2021-08-01 ENCOUNTER — Encounter: Payer: Self-pay | Admitting: Family Medicine

## 2021-08-01 VITALS — BP 128/64 | HR 78 | Temp 98.1°F | Ht 67.5 in | Wt 163.4 lb

## 2021-08-01 DIAGNOSIS — E7849 Other hyperlipidemia: Secondary | ICD-10-CM | POA: Diagnosis not present

## 2021-08-01 DIAGNOSIS — I10 Essential (primary) hypertension: Secondary | ICD-10-CM | POA: Diagnosis not present

## 2021-08-01 DIAGNOSIS — D751 Secondary polycythemia: Secondary | ICD-10-CM | POA: Diagnosis not present

## 2021-08-01 DIAGNOSIS — R1030 Lower abdominal pain, unspecified: Secondary | ICD-10-CM

## 2021-08-01 DIAGNOSIS — K588 Other irritable bowel syndrome: Secondary | ICD-10-CM | POA: Diagnosis not present

## 2021-08-01 DIAGNOSIS — E038 Other specified hypothyroidism: Secondary | ICD-10-CM | POA: Diagnosis not present

## 2021-08-01 MED ORDER — PRAVASTATIN SODIUM 40 MG PO TABS: 40.0000 mg | ORAL_TABLET | Freq: Every day | ORAL | 1 refills | Status: DC

## 2021-08-01 MED ORDER — DICYCLOMINE HCL 10 MG PO CAPS: ORAL_CAPSULE | ORAL | 2 refills | Status: DC

## 2021-08-01 MED ORDER — AMLODIPINE BESYLATE 2.5 MG PO TABS: 2.5000 mg | ORAL_TABLET | Freq: Every day | ORAL | 3 refills | Status: DC

## 2021-08-01 MED ORDER — LEVOTHYROXINE SODIUM 100 MCG PO TABS: ORAL_TABLET | ORAL | 3 refills | Status: DC

## 2021-08-01 MED ORDER — VALSARTAN 80 MG PO TABS
ORAL_TABLET | ORAL | 1 refills | Status: DC
Start: 2021-08-01 — End: 2022-01-01

## 2021-08-01 NOTE — Patient Instructions (Signed)
We will let you know the results of your tests ?

## 2021-08-01 NOTE — Progress Notes (Signed)
? ?  Subjective:  ? ? Patient ID: Thomas [name], male    DOB: 1934-04-19, 87 y.o.   MRN: [id number] ? ?HPI ? ?Patient here for 6 month follow up. Patient states his stomach is bothering him but other than that, he has been doing good. ?Lower abdominal pain - Plan: CBC with Differential, Comprehensive Metabolic Panel (CMET), Lipase, TSH, C-reactive protein ? ?Other irritable bowel syndrome - Plan: CBC with Differential, Comprehensive Metabolic Panel (CMET), Lipase, TSH, C-reactive protein, IFOBT POC (occult bld, rslt in office) ? ?Polycythemia, secondary - Plan: CBC with Differential, Comprehensive Metabolic Panel (CMET), Lipase, TSH, C-reactive protein ? ?Other specified hypothyroidism - Plan: CBC with Differential, Comprehensive Metabolic Panel (CMET), Lipase, TSH, C-reactive protein ? ?HTN (hypertension), benign - Plan: CBC with Differential, Comprehensive Metabolic Panel (CMET), Lipase, TSH, C-reactive protein ? ?Other hyperlipidemia - Plan: CBC with Differential, Comprehensive Metabolic Panel (CMET), Lipase, TSH, C-reactive protein ?He denies any type of chest pain shortness of breath ?Takes his medicine ?Tries watch salt in his diet tries to take vegetables and fruits and on a regular basis ?Denies any major setbacks does take his thyroid medicine ?Denies any rectal bleeding ?No falls ? ?Intermittent lower abdominal discomfort that lasts off and on each day a few days in a row each month no vomiting bloody stools or diarrhea associated with it does not wake him up at night not losing weight ?Review of Systems ? ?   ?Objective:  ? Physical Exam ?General-in no acute distress ?Eyes-no discharge ?Lungs-respiratory rate normal, CTA ?CV-no murmurs,RRR ?Extremities skin warm dry no edema ?Neuro grossly normal ?Behavior normal, alert ? ? ?Abdomen is soft no guarding rebound or tenderness ? ?   ?Assessment & Plan:  ?1. Lower abdominal pain ?He does have intermittent lower abdominal pain over the last few days at a time  comes and goes.  We will check lab work see what this shows if he has ongoing trouble may need to do a CAT scan.  Does sound like possibility irritable bowel try dicyclomine he denies any blood ?- CBC with Differential ?- Comprehensive Metabolic Panel (CMET) ?- Lipase ?- TSH ?- C-reactive protein ? ?2. Other irritable bowel syndrome ?See per above.  If ongoing troubles over the next month neck step gastroenterology ?- CBC with Differential ?- Comprehensive Metabolic Panel (CMET) ?- Lipase ?- TSH ?- C-reactive protein ?- IFOBT POC (occult bld, rslt in office); Future ? ?3. Polycythemia, secondary ?Check CBC ?- CBC with Differential ?- Comprehensive Metabolic Panel (CMET) ?- Lipase ?- TSH ?- C-reactive protein ? ?4. Other specified hypothyroidism ?Continue thyroid medicine check TSH ?- CBC with Differential ?- Comprehensive Metabolic Panel (CMET) ?- Lipase ?- TSH ?- C-reactive protein ? ?5. HTN (hypertension), benign ?Blood pressure good control continue current measures ?- CBC with Differential ?- Comprehensive Metabolic Panel (CMET) ?- Lipase ?- TSH ?- C-reactive protein ? ?6. Other hyperlipidemia ?Watch diet closely ?- CBC with Differential ?- Comprehensive Metabolic Panel (CMET) ?- Lipase ?- TSH ?- C-reactive protein ? ? ?

## 2021-08-02 LAB — CBC WITH DIFFERENTIAL/PLATELET
Basophils Absolute: 0 10*3/uL (ref 0.0–0.2)
Basos: 1 %
EOS (ABSOLUTE): 0.2 10*3/uL (ref 0.0–0.4)
Eos: 3 %
Hematocrit: 48.2 % (ref 37.5–51.0)
Hemoglobin: 17.4 g/dL (ref 13.0–17.7)
Immature Grans (Abs): 0 10*3/uL (ref 0.0–0.1)
Immature Granulocytes: 0 %
Lymphocytes Absolute: 1.2 10*3/uL (ref 0.7–3.1)
Lymphs: 23 %
MCH: 34.3 pg — ABNORMAL HIGH (ref 26.6–33.0)
MCHC: 36.1 g/dL — ABNORMAL HIGH (ref 31.5–35.7)
MCV: 95 fL (ref 79–97)
Monocytes Absolute: 0.3 10*3/uL (ref 0.1–0.9)
Monocytes: 7 %
Neutrophils Absolute: 3.3 10*3/uL (ref 1.4–7.0)
Neutrophils: 66 %
Platelets: 173 10*3/uL (ref 150–450)
RBC: 5.07 x10E6/uL (ref 4.14–5.80)
RDW: 12.4 % (ref 11.6–15.4)
WBC: 5 10*3/uL (ref 3.4–10.8)

## 2021-08-02 LAB — COMPREHENSIVE METABOLIC PANEL
ALT: 28 IU/L (ref 0–44)
AST: 28 IU/L (ref 0–40)
Albumin/Globulin Ratio: 2.1 (ref 1.2–2.2)
Albumin: 4.5 g/dL (ref 3.6–4.6)
Alkaline Phosphatase: 78 IU/L (ref 44–121)
BUN/Creatinine Ratio: 9 — ABNORMAL LOW (ref 10–24)
BUN: 9 mg/dL (ref 8–27)
Bilirubin Total: 0.8 mg/dL (ref 0.0–1.2)
CO2: 24 mmol/L (ref 20–29)
Calcium: 9.4 mg/dL (ref 8.6–10.2)
Chloride: 102 mmol/L (ref 96–106)
Creatinine, Ser: 0.96 mg/dL (ref 0.76–1.27)
Globulin, Total: 2.1 g/dL (ref 1.5–4.5)
Glucose: 81 mg/dL (ref 70–99)
Potassium: 4.2 mmol/L (ref 3.5–5.2)
Sodium: 142 mmol/L (ref 134–144)
Total Protein: 6.6 g/dL (ref 6.0–8.5)
eGFR: 78 mL/min/{1.73_m2} (ref >59)

## 2021-08-02 LAB — LIPASE: Lipase: 23 U/L (ref 13–78)

## 2021-08-02 LAB — C-REACTIVE PROTEIN: CRP: <1 mg/L (ref 0–10)

## 2021-08-02 LAB — TSH: TSH: 1.2 u[IU]/mL (ref 0.450–4.500)

## 2021-08-08 ENCOUNTER — Other Ambulatory Visit: Payer: Self-pay | Admitting: *Deleted

## 2021-08-08 DIAGNOSIS — K588 Other irritable bowel syndrome: Secondary | ICD-10-CM

## 2021-08-08 LAB — IFOBT (OCCULT BLOOD): IFOBT: NEGATIVE

## 2021-08-18 ENCOUNTER — Telehealth: Payer: Self-pay | Admitting: Family Medicine

## 2021-08-18 DIAGNOSIS — D751 Secondary polycythemia: Secondary | ICD-10-CM

## 2021-08-18 NOTE — Telephone Encounter (Signed)
Left message to return call 

## 2021-08-18 NOTE — Telephone Encounter (Signed)
Patient advised per Dr Nicki Reaper: Dr Nicki Reaper did communicate with hematology.  They stated that at this point time they did not feel he needed any type of intervention or treatment but they did recommend repeating CBC in 4 months.  Please order and have patient do follow-up CBC in 4 months.  Patient has standard follow-up visit in the fall that he should keep ?Patient verbalized understanding and blood work ordered in [employer]. ?

## 2021-08-18 NOTE — Telephone Encounter (Signed)
Nurses-I did communicate with hematology.  They stated that at this point time they did not feel he needed any type of intervention or treatment but they did recommend repeating CBC in 4 months.  Please order and have patient do follow-up CBC in 4 months.  Patient has standard follow-up visit in the fall that he should keep.  Thanks-Dr. Nicki Reaper ?(Below is the communication from hematology) ? ?Harriett Rush, PA-C  [name], MD ?Hi Dr. Wolfgang Phoenix!  ? ?Thanks for reaching out regarding Thomas [name].  I think he is okay to watch and wait for now, since I would not recommend any intervention at this time.  His hematocrit is still decent, so no need for therapeutic phlebotomy at this time.  I think a recheck in 3 to 4 months is reasonable, and he could come back to [place] at that time if needed.  ? ?If I remember correctly, he was a bit frustrated with coming to hematology appointments when all we were really doing was checking labs, and he said he would rather just see you for that.  But if his labs warrant further intervention on our part in the future, we would be more than happy to see him again.  ? ?Thanks again for collaborating so well!  ? ?Best regards,  ? ?Rebekah  ?

## 2021-09-05 ENCOUNTER — Other Ambulatory Visit: Payer: Self-pay | Admitting: Family Medicine

## 2021-10-20 ENCOUNTER — Ambulatory Visit: Payer: Medicare Other | Admitting: Allergy & Immunology

## 2021-10-25 ENCOUNTER — Encounter: Payer: Self-pay | Admitting: Allergy & Immunology

## 2021-10-25 ENCOUNTER — Ambulatory Visit: Payer: Medicare Other | Admitting: Allergy & Immunology

## 2021-10-25 VITALS — BP 130/72 | HR 58 | Temp 98.7°F | Resp 16 | Ht 67.25 in | Wt 161.5 lb

## 2021-10-25 DIAGNOSIS — J4540 Moderate persistent asthma, uncomplicated: Secondary | ICD-10-CM | POA: Diagnosis not present

## 2021-10-25 DIAGNOSIS — J302 Other seasonal allergic rhinitis: Secondary | ICD-10-CM

## 2021-10-25 DIAGNOSIS — J3089 Other allergic rhinitis: Secondary | ICD-10-CM

## 2021-10-25 MED ORDER — ALBUTEROL SULFATE HFA 108 (90 BASE) MCG/ACT IN AERS: 2.0000 | INHALATION_SPRAY | RESPIRATORY_TRACT | 1 refills | Status: DC | PRN

## 2021-10-25 MED ORDER — BUDESONIDE-FORMOTEROL FUMARATE 80-4.5 MCG/ACT IN AERO: 1.0000 | INHALATION_SPRAY | Freq: Two times a day (BID) | RESPIRATORY_TRACT | 12 refills | Status: DC

## 2021-10-25 NOTE — Patient Instructions (Addendum)
1. Moderate persistent asthma, uncomplicated - Lung testing looks amazing today.  - We are not going to make any medication changes at this time. - Daily controller medication(s): Symbicort 80/4.49mg one puff twice daily with spacer - Prior to physical activity: albuterol 2 puffs 10-15 minutes before physical activity. - Rescue medications: albuterol 4 puffs every 4-6 hours as needed - Changes during respiratory infections or worsening symptoms: Increase Symbicort 80/4.577m to 2 puffs twice daily for TWO WEEKS. - Asthma control goals:  * Full participation in all desired activities (may need albuterol before activity) * Albuterol use two time or less a week on average (not counting use with activity) * Cough interfering with sleep two time or less a month * Oral steroids no more than once a year * No hospitalizations  2. Seasonal and perennial allergic rhinitis  - We will continue to hold allergy shots. - Continue with Clarinex once daily.   3. Return in about 1 year (around 10/26/2022).    Please inform usKoreaf any Emergency Department visits, hospitalizations, or changes in symptoms. Call usKoreaefore going to the ED for breathing or allergy symptoms since we might be able to fit you in for a sick visit. Feel free to contact usKoreanytime with any questions, problems, or concerns.  It was a pleasure to see you again today!  Websites that have reliable patient information: 1. American Academy of Asthma, Allergy, and Immunology: www.aaaai.org 2. Food Allergy Research and Education (FARE): foodallergy.org 3. Mothers of Asthmatics: http://www.asthmacommunitynetwork.org 4. American College of Allergy, Asthma, and Immunology: www.acaai.org   COVID-19 Vaccine Information can be found at: [email or web address] questions related to vaccine distribution or appointments, please email [email or web address] or call [phone]  We realize  that you might be concerned about having an allergic reaction to the COVID19 vaccines. To help with that concern, WE ARE OFFERING THE COVID19 VACCINES IN OUR OFFICE! Ask the front desk for dates!     "Like" usKorean Facebook and Instagram for our latest updates!      A healthy democracy works best when ALNew York Life Insurancearticipate! Make sure you are registered to vote! If you have moved or changed any of your contact information, you will need to get this updated before voting!  In some cases, you MAY be able to register to vote online: [email or web address]

## 2021-10-25 NOTE — Progress Notes (Signed)
FOLLOW UP  Date of Service/Encounter:  10/25/21   Assessment:   Moderate persistent asthma, uncomplicated   Seasonal and perennial allergic rhinitis - on allergen immunotherapy for 10+ years (stopped 2021)  Plan/Recommendations:   1. Moderate persistent asthma, uncomplicated - Lung testing looks amazing today.  - We are not going to make any medication changes at this time. - Daily controller medication(s): Symbicort 80/4.106mg one puff twice daily with spacer - Prior to physical activity: albuterol 2 puffs 10-15 minutes before physical activity. - Rescue medications: albuterol 4 puffs every 4-6 hours as needed - Changes during respiratory infections or worsening symptoms: Increase Symbicort 80/4.540m to 2 puffs twice daily for TWO WEEKS. - Asthma control goals:  * Full participation in all desired activities (may need albuterol before activity) * Albuterol use two time or less a week on average (not counting use with activity) * Cough interfering with sleep two time or less a month * Oral steroids no more than once a year * No hospitalizations  2. Seasonal and perennial allergic rhinitis  - We will continue to hold allergy shots. - Continue with Clarinex once daily.   3. Return in about 1 year (around 10/26/2022).   Subjective:   Thomas [name] a [age].o. male presenting today for follow up of  Chief Complaint  Patient presents with   Asthma    Asthma is doing great since his last visit.    Allergic Rhinitis     Runny nose, sneezing, watery eyes but the meds take care of it.     Thomas [name] a history of the following: Patient Active Problem List   Diagnosis Date Noted   [place]ular emph[place]sema ([place]/10/[place]   PAC (premature atrial contraction) 01/11/2018   Inguinal hernia 04/26/2016   Abnormal CT of the abdomen 02/21/2016   Hyperlipemia 09/14/2015   Chest pain 02/04/2015   Polycythemia, secondary 09/11/2013   Allergic rhinitis 09/11/2013    Hypothyroidism 06/01/2013   Reactive airway disease 08/29/2012   Thyroid nodule 08/29/2012   GERD (gastroesophageal reflux disease) 05/07/2011   HTN (hypertension), benign 12/09/2009   Essential hypertension 12/09/2009   CORONARY ATHEROSCLEROSIS NATIVE CORONARY ARTERY 12/09/2009   ABNORMAL ELECTROCARDIOGRAM 12/09/2009    History obtained from: chart review and patient.  Thomas [name] a [age].o. male presenting for a follow up visit.  He was last seen in July 2022.  At that time, we did not do lung testing.  We continue with Symbicort 80 mcg 1 puff twice daily as well as albuterol as needed.  For the allergic rhinitis, we continued with Clarinex once daily.  Since last visit, he has done very well. He just celebrated a birthday and had Ge[place] cake with vanilla ice cream.   Asthma/Respiratory Symptom History: He remains on the Symbicort one puff BID. He has not needed any prednisone and has not been using his albuterol much at all. He has ot been to the hospital and he has not been to the ED.   Allergic Rhinitis Symptom History: Rhinitis symptoms are controlled with Clarinex once daily. He has not been on antibiotics for an sinus infections or ear infections.   He does have a history of secondary polycythemia vera as well as Raynaud's phenomenon.  He has had a work-up including JAK2 mutations which were negative.  He has required phlebotomies in the past, last done in June 2018.  Otherwise, there have been no changes to his past medical history, surgical history, family  history, or social history.    Review of Systems  Constitutional: Negative.  Negative for chills, fever, malaise/fatigue and weight loss.  HENT:  Negative for congestion, ear discharge, ear pain, nosebleeds and sinus pain.   Eyes:  Negative for pain, discharge and redness.  Respiratory:  Negative for cough, sputum production, shortness of breath and wheezing.   Cardiovascular: Negative.  Negative for chest pain and  palpitations.  Gastrointestinal:  Negative for abdominal pain, constipation, diarrhea, heartburn, nausea and vomiting.  Skin: Negative.  Negative for itching and rash.  Neurological:  Negative for dizziness and headaches.  Endo/Heme/Allergies:  Positive for environmental allergies. Does not bruise/bleed easily.       Objective:   Blood pressure 130/72, pulse (!) 58, temperature 98.7 F (37.1 C), resp. rate 16, height 5' 7.25" (1.708 m), weight 161 lb 8 oz (73.3 kg), SpO2 97 %. Body mass index is 25.11 kg/m.    Physical Exam Vitals reviewed.  Constitutional:      Appearance: He is well-developed.     Comments: Very friendly.  Hard of hearing.  HENT:     Head: Normocephalic and atraumatic.     Right Ear: Tympanic membrane, ear canal and external ear normal.     Left Ear: Tympanic membrane, ear canal and external ear normal.     Nose: No nasal deformity, septal deviation, mucosal edema or rhinorrhea.     Right Turbinates: Enlarged, swollen and pale.     Left Turbinates: Enlarged, swollen and pale.     Right Sinus: No maxillary sinus tenderness or frontal sinus tenderness.     Left Sinus: No maxillary sinus tenderness or frontal sinus tenderness.     Mouth/Throat:     Mouth: Mucous membranes are not pale and not dry.     Pharynx: Uvula midline.  Eyes:     General: Lids are normal. No allergic shiner.       Right eye: No discharge.        Left eye: No discharge.     Conjunctiva/sclera: Conjunctivae normal.     Right eye: Right conjunctiva is not injected. No chemosis.    Left eye: Left conjunctiva is not injected. No chemosis.    Pupils: Pupils are equal, round, and reactive to light.  Cardiovascular:     Rate and Rhythm: Normal rate and regular rhythm.     Heart sounds: Normal heart sounds.  Pulmonary:     Effort: Pulmonary effort is normal. No tachypnea, accessory muscle usage or respiratory distress.     Breath sounds: Normal breath sounds. No wheezing, rhonchi or rales.      Comments: Moving air well in all lung fields.  No increased work of breathing. Chest:     Chest wall: No tenderness.  Lymphadenopathy:     Cervical: No cervical adenopathy.  Skin:    Coloration: Skin is not pale.     Findings: No abrasion, erythema, petechiae or rash. Rash is not papular, urticarial or vesicular.  Neurological:     Mental Status: He is alert.  Psychiatric:        Behavior: Behavior is cooperative.      Diagnostic studies:    Spirometry: results normal (FEV1: 2.15/87%, FVC: 2.98/89%, FEV1/FVC: 72%).    Spirometry consistent with normal pattern.    Allergy Studies: none        Salvatore Marvel, MD  [facility] [place] [facility] [place]

## 2021-12-14 ENCOUNTER — Other Ambulatory Visit: Payer: Self-pay | Admitting: Family Medicine

## 2021-12-30 ENCOUNTER — Other Ambulatory Visit: Payer: Self-pay | Admitting: Family Medicine

## 2022-01-01 ENCOUNTER — Encounter: Payer: Self-pay | Admitting: Family Medicine

## 2022-01-01 ENCOUNTER — Ambulatory Visit (INDEPENDENT_AMBULATORY_CARE_PROVIDER_SITE_OTHER): Payer: Medicare Other | Admitting: Family Medicine

## 2022-01-01 VITALS — BP 148/74 | HR 55 | Temp 98.1°F | Ht 67.5 in | Wt 162.0 lb

## 2022-01-01 DIAGNOSIS — Z23 Encounter for immunization: Secondary | ICD-10-CM | POA: Diagnosis not present

## 2022-01-01 DIAGNOSIS — J432 Centrilobular emphysema: Secondary | ICD-10-CM | POA: Diagnosis not present

## 2022-01-01 DIAGNOSIS — E7849 Other hyperlipidemia: Secondary | ICD-10-CM

## 2022-01-01 DIAGNOSIS — E038 Other specified hypothyroidism: Secondary | ICD-10-CM | POA: Diagnosis not present

## 2022-01-01 DIAGNOSIS — I10 Essential (primary) hypertension: Secondary | ICD-10-CM | POA: Diagnosis not present

## 2022-01-01 MED ORDER — VALSARTAN 80 MG PO TABS: ORAL_TABLET | ORAL | 1 refills | Status: DC

## 2022-01-01 NOTE — Progress Notes (Addendum)
   Subjective:    Patient ID: Thomas [name], male    DOB: 12-18-1934, 87 y.o.   MRN: [id number]  Hypertension This is a chronic problem. Treatments tried: amlodipine, valsartan.  HTN (hypertension), benign - Plan: Lipid panel, Hepatic Function Panel, TSH, CBC with Differential/Platelet  Flu vaccine need - Plan: Flu Vaccine QUAD High Dose(Fluad)  Other specified hypothyroidism - Plan: Lipid panel, Hepatic Function Panel, TSH, CBC with Differential/Platelet  Other hyperlipidemia - Plan: Lipid panel, Hepatic Function Panel, TSH, CBC with Differential/Platelet  Immunization due - Plan: Pneumococcal conjugate vaccine 20-valent (Prevnar 20)  Patient with hyperlipidemia issues takes medication on a regular basis watches diet Blood pressure issues takes his medicine regular basis denies any problems tries to eat healthy Does not experience much in the way reflux.  His respiratory issues are stable sees allergist on a yearly basis Has thyroid takes his medicine denies any major issues  Energy level at times somewhat subpar but he feels overall is doing pretty good for being 87    Review of Systems     Objective:   Physical Exam General-in no acute distress Eyes-no discharge Lungs-respiratory rate normal, CTA CV-no murmurs,RRR Extremities skin warm dry no edema Neuro grossly normal Behavior normal, alert        Assessment & Plan:  1. HTN (hypertension), benign Blood pressure good control healthy diet continue regular measures  The patient's systolic blood pressure is slightly elevated at 148 but taking into context his age and his activity and diastolic pressure under AAFP guidelines do not recommend additional medication to lessen the risk of lightheadedness and falls - Lipid panel - Hepatic Function Panel - TSH - CBC with Differential/Platelet  2. Flu vaccine need Today - Flu Vaccine QUAD High Dose(Fluad)  3. Other specified hypothyroidism Continue medication check  lab work - Lipid panel - Hepatic Function Panel - TSH - CBC with Differential/Platelet  4. Other hyperlipidemia Healthy diet continue medicine check lab work - Lipid panel - Hepatic Function Panel - TSH - CBC with Differential/Platelet  5. 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  Stable condition currently Follow-up in 6 months

## 2022-01-05 DIAGNOSIS — I10 Essential (primary) hypertension: Secondary | ICD-10-CM | POA: Diagnosis not present

## 2022-01-05 DIAGNOSIS — E038 Other specified hypothyroidism: Secondary | ICD-10-CM | POA: Diagnosis not present

## 2022-01-05 DIAGNOSIS — E7849 Other hyperlipidemia: Secondary | ICD-10-CM | POA: Diagnosis not present

## 2022-01-06 ENCOUNTER — Encounter: Payer: Self-pay | Admitting: Family Medicine

## 2022-01-06 LAB — CBC WITH DIFFERENTIAL/PLATELET
Basophils Absolute: 0 10*3/uL (ref 0.0–0.2)
Basos: 0 %
EOS (ABSOLUTE): 0.3 10*3/uL (ref 0.0–0.4)
Eos: 4 %
Hematocrit: 47.3 % (ref 37.5–51.0)
Hemoglobin: 15.6 g/dL (ref 13.0–17.7)
Immature Grans (Abs): 0 10*3/uL (ref 0.0–0.1)
Immature Granulocytes: 0 %
Lymphocytes Absolute: 1.1 10*3/uL (ref 0.7–3.1)
Lymphs: 16 %
MCH: 31.8 pg (ref 26.6–33.0)
MCHC: 33 g/dL (ref 31.5–35.7)
MCV: 97 fL (ref 79–97)
Monocytes Absolute: 0.5 10*3/uL (ref 0.1–0.9)
Monocytes: 7 %
Neutrophils Absolute: 5.2 10*3/uL (ref 1.4–7.0)
Neutrophils: 73 %
Platelets: 181 10*3/uL (ref 150–450)
RBC: 4.9 x10E6/uL (ref 4.14–5.80)
RDW: 12.7 % (ref 11.6–15.4)
WBC: 7.1 10*3/uL (ref 3.4–10.8)

## 2022-01-06 LAB — LIPID PANEL
Chol/HDL Ratio: 3.5 ratio (ref 0.0–5.0)
Cholesterol, Total: 138 mg/dL (ref 100–199)
HDL: 40 mg/dL (ref >39)
LDL Chol Calc (NIH): 78 mg/dL (ref 0–99)
Triglycerides: 111 mg/dL (ref 0–149)
VLDL Cholesterol Cal: 20 mg/dL (ref 5–40)

## 2022-01-06 LAB — HEPATIC FUNCTION PANEL
ALT: 20 IU/L (ref 0–44)
AST: 21 IU/L (ref 0–40)
Albumin: 4.1 g/dL (ref 3.7–4.7)
Alkaline Phosphatase: 84 IU/L (ref 44–121)
Bilirubin Total: 1.1 mg/dL (ref 0.0–1.2)
Bilirubin, Direct: 0.28 mg/dL (ref 0.00–0.40)
Total Protein: 6.1 g/dL (ref 6.0–8.5)

## 2022-01-06 LAB — TSH: TSH: 0.932 u[IU]/mL (ref 0.450–4.500)

## 2022-01-06 NOTE — Progress Notes (Signed)
Please mail to patient

## 2022-01-19 ENCOUNTER — Telehealth: Payer: Self-pay | Admitting: Family Medicine

## 2022-01-19 NOTE — Telephone Encounter (Signed)
Left message for patient to call back and schedule Medicare Annual Wellness Visit (AWV).  Please offer to do virtually or by telephone.  Last AWV: 01/30/2021  Please schedule at anytime with [facility].  30 minute appointment for Virtual or phone  45 minute appointment for initial virtual/phone  Any questions, please contact me at [phone]

## 2022-01-31 ENCOUNTER — Ambulatory Visit: Payer: Medicare Other | Admitting: Family Medicine

## 2022-02-01 ENCOUNTER — Ambulatory Visit (INDEPENDENT_AMBULATORY_CARE_PROVIDER_SITE_OTHER): Payer: Medicare Other

## 2022-02-01 VITALS — Ht 67.5 in | Wt 162.0 lb

## 2022-02-01 DIAGNOSIS — Z0000 Encounter for general adult medical examination without abnormal findings: Secondary | ICD-10-CM | POA: Diagnosis not present

## 2022-02-01 DIAGNOSIS — I7121 Aneurysm of the ascending aorta, without rupture: Secondary | ICD-10-CM | POA: Insufficient documentation

## 2022-02-01 DIAGNOSIS — D751 Secondary polycythemia: Secondary | ICD-10-CM | POA: Insufficient documentation

## 2022-02-01 DIAGNOSIS — I2510 Atherosclerotic heart disease of native coronary artery without angina pectoris: Secondary | ICD-10-CM | POA: Insufficient documentation

## 2022-02-01 NOTE — Patient Instructions (Signed)
Thomas [name] , Thank you for taking time to come for your Medicare Wellness Visit. I appreciate your ongoing commitment to your health goals. Please review the following plan we discussed and let me know if I can assist you in the future.   Screening recommendations/referrals: Colonoscopy: aged out Recommended yearly ophthalmology/optometry visit for glaucoma screening and checkup Recommended yearly dental visit for hygiene and checkup  Vaccinations: Influenza vaccine: 01/01/22 Pneumococcal vaccine: 10/30/13 Tdap vaccine: n/d Shingles vaccine: Zostavax 02/16/15   Covid-19: 05/21/19, 06/19/19  Advanced directives: no  Conditions/risks identified: none  Next appointment: Follow up in one year for your annual wellness visit. 02/12/23 @ 2:40 pm by phone  Preventive Care 65 Years and Older, Male Preventive care refers to lifestyle choices and visits with your health care provider that can promote health and wellness. What does preventive care include? A yearly physical exam. This is also called an annual well check. Dental exams once or twice a year. Routine eye exams. Ask your health care provider how often you should have your eyes checked. Personal lifestyle choices, including: Daily care of your teeth and gums. Regular physical activity. Eating a healthy diet. Avoiding tobacco and drug use. Limiting alcohol use. Practicing safe sex. Taking low doses of aspirin every day. Taking vitamin and mineral supplements as recommended by your health care provider. What happens during an annual well check? The services and screenings done by your health care provider during your annual well check will depend on your age, overall health, lifestyle risk factors, and family history of disease. Counseling  Your health care provider may ask you questions about your: Alcohol use. Tobacco use. Drug use. Emotional well-being. Home and relationship well-being. Sexual activity. Eating habits. History  of falls. Memory and ability to understand (cognition). Work and work Statistician. Screening  You may have the following tests or measurements: Height, weight, and BMI. Blood pressure. Lipid and cholesterol levels. These may be checked every 5 years, or more frequently if you are over 30 years old. Skin check. Lung cancer screening. You may have this screening every year starting at age 5 if you have a 30-pack-year history of smoking and currently smoke or have quit within the past 15 years. Fecal occult blood test (FOBT) of the stool. You may have this test every year starting at age 31. Flexible sigmoidoscopy or colonoscopy. You may have a sigmoidoscopy every 5 years or a colonoscopy every 10 years starting at age 61. Prostate cancer screening. Recommendations will vary depending on your family history and other risks. Hepatitis C blood test. Hepatitis B blood test. Sexually transmitted disease (STD) testing. Diabetes screening. This is done by checking your blood sugar (glucose) after you have not eaten for a while (fasting). You may have this done every 1-3 years. Abdominal aortic aneurysm (AAA) screening. You may need this if you are a current or former smoker. Osteoporosis. You may be screened starting at age 70 if you are at high risk. Talk with your health care provider about your test results, treatment options, and if necessary, the need for more tests. Vaccines  Your health care provider may recommend certain vaccines, such as: Influenza vaccine. This is recommended every year. Tetanus, diphtheria, and acellular pertussis (Tdap, Td) vaccine. You may need a Td booster every 10 years. Zoster vaccine. You may need this after age 33. Pneumococcal 13-valent conjugate (PCV13) vaccine. One dose is recommended after age 62. Pneumococcal polysaccharide (PPSV23) vaccine. One dose is recommended after age 61. Talk to your health  care provider about which screenings and vaccines you need  and how often you need them. This information is not intended to replace advice given to you by your health care provider. Make sure you discuss any questions you have with your health care provider. Document Released: 04/29/2015 Document Revised: 12/21/2015 Document Reviewed: 02/01/2015 Elsevier Interactive Patient Education  2017 [employer] Prevention in the Home Falls can cause injuries. They can happen to people of all ages. There are many things you can do to make your home safe and to help prevent falls. What can I do on the outside of my home? Regularly fix the edges of walkways and driveways and fix any cracks. Remove anything that might make you trip as you walk through a door, such as a raised step or threshold. Trim any bushes or trees on the path to your home. Use bright outdoor lighting. Clear any walking paths of anything that might make someone trip, such as rocks or tools. Regularly check to see if handrails are loose or broken. Make sure that both sides of any steps have handrails. Any raised decks and porches should have guardrails on the edges. Have any leaves, snow, or ice cleared regularly. Use sand or salt on walking paths during winter. Clean up any spills in your garage right away. This includes oil or grease spills. What can I do in the bathroom? Use night lights. Install grab bars by the toilet and in the tub and shower. Do not use towel bars as grab bars. Use non-skid mats or decals in the tub or shower. If you need to sit down in the shower, use a plastic, non-slip stool. Keep the floor dry. Clean up any water that spills on the floor as soon as it happens. Remove soap buildup in the tub or shower regularly. Attach bath mats securely with double-sided non-slip rug tape. Do not have throw rugs and other things on the floor that can make you trip. What can I do in the bedroom? Use night lights. Make sure that you have a light by your bed that is easy  to reach. Do not use any sheets or blankets that are too big for your bed. They should not hang down onto the floor. Have a firm chair that has side arms. You can use this for support while you get dressed. Do not have throw rugs and other things on the floor that can make you trip. What can I do in the kitchen? Clean up any spills right away. Avoid walking on wet floors. Keep items that you use a lot in easy-to-reach places. If you need to reach something above you, use a strong step stool that has a grab bar. Keep electrical cords out of the way. Do not use floor polish or wax that makes floors slippery. If you must use wax, use non-skid floor wax. Do not have throw rugs and other things on the floor that can make you trip. What can I do with my stairs? Do not leave any items on the stairs. Make sure that there are handrails on both sides of the stairs and use them. Fix handrails that are broken or loose. Make sure that handrails are as long as the stairways. Check any carpeting to make sure that it is firmly attached to the stairs. Fix any carpet that is loose or worn. Avoid having throw rugs at the top or bottom of the stairs. If you do have throw rugs, attach them to  the floor with carpet tape. Make sure that you have a light switch at the top of the stairs and the bottom of the stairs. If you do not have them, ask someone to add them for you. What else can I do to help prevent falls? Wear shoes that: Do not have high heels. Have rubber bottoms. Are comfortable and fit you well. Are closed at the toe. Do not wear sandals. If you use a stepladder: Make sure that it is fully opened. Do not climb a closed stepladder. Make sure that both sides of the stepladder are locked into place. Ask someone to hold it for you, if possible. Clearly mark and make sure that you can see: Any grab bars or handrails. First and last steps. Where the edge of each step is. Use tools that help you move  around (mobility aids) if they are needed. These include: Canes. Walkers. Scooters. Crutches. Turn on the lights when you go into a dark area. Replace any light bulbs as soon as they burn out. Set up your furniture so you have a clear path. Avoid moving your furniture around. If any of your floors are uneven, fix them. If there are any pets around you, be aware of where they are. Review your medicines with your doctor. Some medicines can make you feel dizzy. This can increase your chance of falling. Ask your doctor what other things that you can do to help prevent falls. This information is not intended to replace advice given to you by your health care provider. Make sure you discuss any questions you have with your health care provider. Document Released: 01/27/2009 Document Revised: 09/08/2015 Document Reviewed: 05/07/2014 Elsevier Interactive Patient Education  2017 [employer].

## 2022-02-01 NOTE — Progress Notes (Signed)
Virtual Visit via Telephone Note  I connected with  Thomas [name] on 02/01/22 at  8:15 AM EDT by telephone and verified that I am speaking with the correct person using two identifiers.  Location: Patient: home Provider: [facility] Persons participating in the virtual visit: patient/Nurse Health Advisor   I discussed the limitations, risks, security and privacy concerns of performing an evaluation and management service by telephone and the availability of in person appointments. The patient expressed understanding and agreed to proceed.  Interactive audio and video telecommunications were attempted between this nurse and patient, however failed, due to patient having technical difficulties OR patient did not have access to video capability.  We continued and completed visit with audio only.  Some vital signs may be absent or patient reported.   Dionisio David, LPN  Subjective:   Thomas [name] is a 87 y.o. male who presents for Medicare Annual/Subsequent preventive examination.  Review of Systems     Cardiac Risk Factors include: advanced age (>48mn, >>57women);hypertension;dyslipidemia;male gender     Objective:    There were no vitals filed for this visit. There is no height or weight on file to calculate BMI.     02/01/2022    8:19 AM 10/25/2020   12:56 PM 03/31/2019    1:24 PM 10/02/2018    2:31 PM 03/31/2018    3:16 PM 10/23/2017   12:42 PM 04/25/2017    9:37 AM  Advanced Directives  Does Patient Have a Medical Advance Directive? No Yes Yes Yes Yes Yes Yes  Type of ASocial research officer, governmentLiving will HWeiss[place]Living will H[place]Living will HGriff[place]Living will HWindow RockLiving will HMunds ParkLiving will  Does patient want to make changes to medical advance directive?  No - Patient declined No - Patient declined No - Patient declined No - Patient  declined No - Patient declined No - Patient declined  Copy of HEchelonin Chart?  No - copy requested No - copy requested No - copy requested No - copy requested No - copy requested No - copy requested  Would patient like information on creating a medical advance directive? No - Patient declined    No - Patient declined No - Patient declined     Current Medications (verified) Outpatient Encounter Medications as of 02/01/2022  Medication Sig   albuterol (PROVENTIL HFA;VENTOLIN HFA) 108 (90 Base) MCG/ACT inhaler Inhale 2 puffs into the lungs every 4 (four) hours as needed.   amLODipine (NORVASC) 2.5 MG tablet Take 1 tablet (2.5 mg total) by mouth daily.   dicyclomine (BENTYL) 10 MG capsule One taking 3 times daily as needed for lower abdominal discomfort   fluticasone (FLONASE) 50 MCG/ACT nasal spray USE 1 SPRAY IN EACH NOSTRIL TWICE DAILY.   Hypromellose (ARTIFICIAL TEARS OP) Place 1 drop into both eyes daily as needed (dry eyes).   ibuprofen (ADVIL,MOTRIN) 200 MG tablet Take 400 mg by mouth 2 (two) times daily as needed for headache or moderate pain.   ketoconazole (NIZORAL) 2 % cream APPLY TO AFFECTED AREA TWO TIMES DAILY AS NEEDED FOR IRRITATION.   levothyroxine (SYNTHROID) 100 MCG tablet 1/2 on Sunday and Thursday and 1 qd all other days   mometasone (ELOCON) 0.1 % cream APPLY TO AFFECTED AREAS TWICE DAILY AS NEEDED.   omeprazole (PRILOSEC) 20 MG capsule TAKE (1) CAPSULE BY MOUTH ONCE DAILY.   ondansetron (ZOFRAN) 8 MG tablet  Take 1 tablet (8 mg total) by mouth every 8 (eight) hours as needed for nausea.   pravastatin (PRAVACHOL) 40 MG tablet Take 1 tablet (40 mg total) by mouth at bedtime.   triamcinolone cream (KENALOG) 0.1 % Apply 1 application topically 2 (two) times daily.   valsartan (DIOVAN) 80 MG tablet TAKE (1) TABLET BY MOUTH ONCE DAILY.   budesonide-formoterol (SYMBICORT) 80-4.5 MCG/ACT inhaler Inhale 1 puff into the lungs 2 (two) times daily.   [DISCONTINUED]  albuterol (VENTOLIN HFA) 108 (90 Base) MCG/ACT inhaler Inhale 2 puffs into the lungs every 4 (four) hours as needed for wheezing or shortness of breath.   No facility-administered encounter medications on file as of 02/01/2022.    Allergies (verified) Penicillins   History: Past Medical History:  Diagnosis Date   Arthritis    "my entire back is gone"   Asthma    GERD (gastroesophageal reflux disease)    H/O hiatal hernia    Hernia    History of stress test 2011   Hypertension    Hypothyroidism    Kidney stones    Pollen allergies    Polycythemia    Polycythemia, secondary 09/11/2013   Renal calculus    some removed by Cystoscopy, one passed spontaneously   Sleep apnea    mild no cpap   Past Surgical History:  Procedure Laterality Date   APPENDECTOMY     50 years ago   BACK SURGERY     L-4 , L-5  patient states that it was years ago.   COLONOSCOPY     at age 57   COLONOSCOPY N/A 04/06/2016   Procedure: COLONOSCOPY;  Surgeon: Rogene Houston, MD;  [facility];  Service: Endoscopy;  Laterality: N/A;  7:30   EYE SURGERY     cataracts removed- bilateral- /w IOL   INGUINAL HERNIA REPAIR Left 05/14/2016   Procedure: OPEN REPAIR OF SYMPTOMATIC (PAINFUL) LEFT INGUINAL HERNIA WITH MESH;  Surgeon: Vickie Epley, MD;  Location: [facility];  Service: General;  Laterality: Left;   SINOSCOPY     THYROIDECTOMY Right 09/17/2012   Procedure: RIGHT HEMI-THYROIDECTOMY;  Surgeon: Ascencion Dike, MD;  Location: [facility];  Service: ENT;  Laterality: Right;   UPPER GASTROINTESTINAL ENDOSCOPY     egd/ed   YAG LASER APPLICATION Left 01/30/5101   Procedure: YAG LASER APPLICATION;  Surgeon: Rutherford Guys, MD;  Location: [facility];  Service: Ophthalmology;  Laterality: Left;   Family History  Problem Relation Age of Onset   Heart disease Mother    Heart disease Father    Heart disease Brother    Healthy Son    Allergic rhinitis Neg Hx    Angioedema Neg Hx    Asthma Neg Hx    Atopy Neg Hx     Eczema Neg Hx    Immunodeficiency Neg Hx    Urticaria Neg Hx    Social History   Socioeconomic History   Marital status: Widowed    Spouse name: Not on file   Number of children: Not on file   Years of education: Not on file   Highest education level: Not on file  Occupational History   Not on file  Tobacco Use   Smoking status: Never   Smokeless tobacco: Former    Types: Chew   Tobacco comments:    Patient states that he has been chewing for 60 plus years  Substance and Sexual Activity   Alcohol use: No    Comment: Patient  states that he drinks a bout a case of beer a year   Drug use: No   Sexual activity: Never  Other Topics Concern   Not on file  Social History Narrative   Not on file   Social Determinants of Health   Financial Resource Strain: Low Risk  (02/01/2022)   Overall Financial Resource Strain (CARDIA)    Difficulty of Paying Living Expenses: Not hard at all  Food Insecurity: No Food Insecurity (02/01/2022)   Hunger Vital Sign    Worried About Running Out of Food in the Last Year: Never true    Ran Out of Food in the Last Year: Never true  Transportation Needs: No Transportation Needs (02/01/2022)   PRAPARE - Hydrologist (Medical): No    Lack of Transportation (Non-Medical): No  Physical Activity: Sufficiently Active (02/01/2022)   Exercise Vital Sign    Days of Exercise per Week: 5 days    Minutes of Exercise per Session: 50 min  Stress: No Stress Concern Present (02/01/2022)   [place]    Feeling of Stress : Not at all  Social Connections: Socially Isolated (02/01/2022)   Social Connection and Isolation Panel [NHANES]    Frequency of Communication with Friends and Family: More than three times a week    Frequency of Social Gatherings with Friends and Family: Once a week    Attends Religious Services: Never    Marine scientist or Organizations:  No    Attends Archivist Meetings: Never    Marital Status: Widowed    Tobacco Counseling Counseling given: Not Answered Tobacco comments: Patient states that he has been chewing for 60 plus years   Clinical Intake:  Pre-visit preparation completed: Yes  Pain : No/denies pain     Nutritional Risks: None Diabetes: No  How often do you need to have someone help you when you read instructions, pamphlets, or other written materials from your doctor or pharmacy?: 1 - Never  Diabetic?no  Interpreter Needed?: No  Information entered by :: Kirke Shaggy, LPN   Activities of Daily Living    02/01/2022    8:20 AM  In your present state of health, do you have any difficulty performing the following activities:  Hearing? 0  Vision? 0  Difficulty concentrating or making decisions? 0  Walking or climbing stairs? 0  Dressing or bathing? 0  Doing errands, shopping? 0  Preparing Food and eating ? N  Using the Toilet? N  In the past six months, have you accidently leaked urine? N  Do you have problems with loss of bowel control? N  Managing your Medications? N  Managing your Finances? N  Housekeeping or managing your Housekeeping? N    Patient Care Team: [name], MD as PCP - General (Family Medicine)  Indicate any recent Medical Services you may have received from other than [facility] providers in the past year (date may be approximate).     Assessment:   This is a routine wellness examination for Thomas [name].  Hearing/Vision screen Hearing Screening - Comments:: Wears aids Vision Screening - Comments:: No glasses- [facility]   Dietary issues and exercise activities discussed: Current Exercise Habits: Home exercise routine, Type of exercise: walking, Time (Minutes): 50, Frequency (Times/Week): 5, Weekly Exercise (Minutes/Week): 250, Intensity: Moderate   Goals Addressed             This Visit's Progress  DIET - EAT MORE FRUITS AND VEGETABLES          Depression Screen    02/01/2022    8:17 AM 01/01/2022    8:42 AM 01/30/2021   10:01 AM 01/02/2021    8:22 AM 08/19/2020    1:18 PM 07/21/2020    1:08 PM 12/22/2019    9:40 AM  PHQ 2/9 Scores  PHQ - 2 Score 0 0 0 0 0 0 0  PHQ- 9 Score 0          Fall Risk    02/01/2022    8:19 AM 01/01/2022    8:42 AM 01/30/2021   10:01 AM 01/02/2021    8:22 AM 08/19/2020    1:17 PM  Fall Risk   Falls in the past year? 0 0 0 0 0  Number falls in past yr: 0 0 0 0   Injury with Fall? 0 0 0 0   Risk for fall due to : No Fall Risks No Fall Risks No Fall Risks No Fall Risks No Fall Risks  Follow up Falls prevention discussed;Falls evaluation completed Falls evaluation completed Falls evaluation completed Falls evaluation completed Falls evaluation completed    FALL RISK PREVENTION PERTAINING TO THE HOME:  Any stairs in or around the home? No  If so, are there any without handrails? No  Home free of loose throw rugs in walkways, pet beds, electrical cords, etc? Yes  Adequate lighting in your home to reduce risk of falls? Yes   ASSISTIVE DEVICES UTILIZED TO PREVENT FALLS:  Life alert? No  Use of a cane, walker or w/c? No  Grab bars in the bathroom? No  Shower chair or bench in shower? No  Elevated toilet seat or a handicapped toilet? No    Cognitive Function:        02/01/2022    8:21 AM 01/30/2021   10:02 AM  6CIT Screen  What Year? 0 points 0 points  What month? 0 points 0 points  What time? 0 points 0 points  Count back from 20 0 points 0 points  Months in reverse 0 points 0 points  Repeat phrase 0 points 0 points  Total Score 0 points 0 points    Immunizations Immunization History  Administered Date(s) Administered   Fluad Quad(high Dose 65+) 02/12/2020, 01/02/2021, 01/01/2022   Influenza, High Dose Seasonal PF 01/28/2012, 12/19/2018, 12/19/2018   Influenza,inj,Quad PF,6+ Mos 01/21/2014   Influenza-Unspecified 01/15/2015, 02/16/2015, 02/13/2016   Moderna Sars-Covid-2  Vaccination 05/21/2019, 06/19/2019   PNEUMOCOCCAL CONJUGATE-20 01/01/2022   Pneumococcal Conjugate-13 10/30/2013   Pneumococcal Polysaccharide-23 02/24/2007, 01/28/2012   Zoster, Live 02/16/2015    TDAP status: Due, Education has been provided regarding the importance of this vaccine. Advised may receive this vaccine at local pharmacy or Health Dept. Aware to provide a copy of the vaccination record if obtained from local pharmacy or Health Dept. Verbalized acceptance and understanding.  Flu Vaccine status: Up to date  Pneumococcal vaccine status: Up to date  Covid-19 vaccine status: Completed vaccines  Qualifies for Shingles Vaccine? Yes   Zostavax completed Yes   Shingrix Completed?: No.    Education has been provided regarding the importance of this vaccine. Patient has been advised to call insurance company to determine out of pocket expense if they have not yet received this vaccine. Advised may also receive vaccine at local pharmacy or Health Dept. Verbalized acceptance and understanding.  Screening Tests Health Maintenance  Topic Date Due   TETANUS/TDAP  Never done  Zoster Vaccines- Shingrix (1 of 2) Never done   COVID-19 Vaccine (3 - Moderna risk series) 07/17/2019   Pneumonia Vaccine 23+ Years old  Completed   INFLUENZA VACCINE  Completed   HPV VACCINES  Aged Out    Health Maintenance  Health Maintenance Due  Topic Date Due   TETANUS/TDAP  Never done   Zoster Vaccines- Shingrix (1 of 2) Never done   COVID-19 Vaccine (3 - Moderna risk series) 07/17/2019    Colorectal cancer screening: No longer required.   Lung Cancer Screening: (Low Dose CT Chest recommended if Age 68-80 years, 30 pack-year currently smoking OR have quit w/in 15years.) does not qualify.   Additional Screening:  Hepatitis C Screening: does not qualify; Completed no  Vision Screening: Recommended annual ophthalmology exams for early detection of glaucoma and other disorders of the eye. Is the  patient up to date with their annual eye exam?  Yes  Who is the provider or what is the name of the office in which the patient attends annual eye exams? [facility] If pt is not established with a provider, would they like to be referred to a provider to establish care? No .   Dental Screening: Recommended annual dental exams for proper oral hygiene  Community Resource Referral / Chronic Care Management: CRR required this visit?  No   CCM required this visit?  No      Plan:     I have personally reviewed and noted the following in the patient's chart:   Medical and social history Use of alcohol, tobacco or illicit drugs  Current medications and supplements including opioid prescriptions. Patient is not currently taking opioid prescriptions. Functional ability and status Nutritional status Physical activity Advanced directives List of other physicians Hospitalizations, surgeries, and ER visits in previous 12 months Vitals Screenings to include cognitive, depression, and falls Referrals and appointments  In addition, I have reviewed and discussed with patient certain preventive protocols, quality metrics, and best practice recommendations. A written personalized care plan for preventive services as well as general preventive health recommendations were provided to patient.     Dionisio David, LPN   [id number]   Nurse Notes: none

## 2022-02-07 ENCOUNTER — Other Ambulatory Visit: Payer: Self-pay | Admitting: Family Medicine

## 2022-03-07 ENCOUNTER — Other Ambulatory Visit: Payer: Self-pay | Admitting: Family Medicine

## 2022-03-23 ENCOUNTER — Telehealth: Payer: Self-pay | Admitting: Pharmacist

## 2022-03-23 NOTE — Progress Notes (Signed)
[place] Team  Medication Adherence Quality Measures  03/23/22  FAITH BRANAN [birth year]  Provider:  Dr. Sallee Lange   Practice:  [facility]   Medication Adherence Measure(s):   Medication Adherence for Cholesterol Medication  Medication Adherence Findings:  Mr. Hoogland is past due for a refill on Pravastatin 40 mg. Left HIPAA compliant voicemail for patient to call me back.   Loretha Brasil, PharmD [place] Clinical Pharmacist Direct Dial: [phone]

## 2022-03-23 NOTE — Progress Notes (Signed)
[facility] Team  Medication Adherence Quality Measures  03/23/22  Thomas [name] 18-Nov-1934  Provider:  Dr. Nicki Reaper  Practice:  [facility]   Medication Adherence Measure(s):   Medication Adherence for Cholesterol Medication  Medication Adherence Findings: [name] returned my voicemail, he states that he is planning to have his pravastatin refilled on 03/31/2022. He states that he thinks that he got off schedule filling this at some point. Patient confirms that he is currently taking the pravastatin 40 mg. I thanked patient for calling me back. Patient was appreciative of my call.   Loretha Brasil, PharmD [place] Pharmacist Office: [phone]

## 2022-03-26 ENCOUNTER — Ambulatory Visit (INDEPENDENT_AMBULATORY_CARE_PROVIDER_SITE_OTHER): Payer: Medicare Other | Admitting: Family Medicine

## 2022-03-26 ENCOUNTER — Ambulatory Visit (HOSPITAL_COMMUNITY)
Admission: RE | Admit: 2022-03-26 | Discharge: 2022-03-26 | Disposition: A | Discharge status: Home / Self Care | Payer: Medicare Other | Source: Ambulatory Visit | Attending: Family Medicine | Admitting: Family Medicine

## 2022-03-26 VITALS — BP 128/82 | HR 82 | Temp 98.2°F | Ht 67.5 in | Wt 162.0 lb

## 2022-03-26 DIAGNOSIS — R051 Acute cough: Secondary | ICD-10-CM | POA: Insufficient documentation

## 2022-03-26 DIAGNOSIS — J4540 Moderate persistent asthma, uncomplicated: Secondary | ICD-10-CM | POA: Diagnosis not present

## 2022-03-26 DIAGNOSIS — R059 Cough, unspecified: Secondary | ICD-10-CM | POA: Diagnosis not present

## 2022-03-26 MED ORDER — PREDNISONE 50 MG PO TABS: ORAL_TABLET | ORAL | 0 refills | Status: DC

## 2022-03-26 NOTE — Progress Notes (Signed)
Subjective:  Patient ID: Thomas [name], male    DOB: 1935/02/06  Age: 87 y.o. MRN: [id number]  CC: Chief Complaint  Patient presents with   Cough    Congestion for several weeks and now is coughing up green stiff and having soreness in sinuses    HPI:  87 year old male presents for evaluation of the above.  Patient reports that he has had cough and chest congestion for the past couple of weeks.  He states that he normally last this resolved on its own but it has not done so.  He reports some fullness in the sinuses as well.  He states that he had a low-grade temperature of 100.3 this morning.  He states that his sputum is discolored.  No relieving factors.  No reports of shortness of breath.  No other complaints or concerns at this time.  Patient Active Problem List   Diagnosis Date Noted   Moderate persistent asthma 03/26/2022   Acute cough 03/26/2022   Ascending aortic aneurysm ([place]) 02/01/2022   CAD (coronary artery disease), native coronary artery 02/01/2022   Hyperlipemia 09/14/2015   Polycythemia, secondary 09/11/2013   Allergic rhinitis 09/11/2013   Hypothyroidism 06/01/2013   Thyroid nodule 08/29/2012   GERD (gastroesophageal reflux disease) 05/07/2011   Essential hypertension 12/09/2009    Social Hx   Social History   Socioeconomic History   Marital status: Widowed    Spouse name: Not on file   Number of children: Not on file   Years of education: Not on file   Highest education level: Not on file  Occupational History   Not on file  Tobacco Use   Smoking status: Never   Smokeless tobacco: Former    Types: Chew   Tobacco comments:    Patient states that he has been chewing for 60 plus years  Substance and Sexual Activity   Alcohol use: No    Comment: Patient states that he drinks a bout a case of beer a year   Drug use: No   Sexual activity: Never  Other Topics Concern   Not on file  Social History Narrative   Not on file   Social Determinants  of Health   Financial Resource Strain: Low Risk  (02/01/2022)   Overall Financial Resource Strain (CARDIA)    Difficulty of Paying Living Expenses: Not hard at all  Food Insecurity: No Food Insecurity (02/01/2022)   Hunger Vital Sign    Worried About Running Out of Food in the Last Year: Never true    [place] in the Last Year: Never true  Transportation Needs: No Transportation Needs (02/01/2022)   PRAPARE - Hydrologist (Medical): No    Lack of Transportation (Non-Medical): No  Physical Activity: Sufficiently Active (02/01/2022)   Exercise Vital Sign    Days of Exercise per Week: 5 days    Minutes of Exercise per Session: 50 min  Stress: No Stress Concern Present (02/01/2022)   [place]    Feeling of Stress : Not at all  Social Connections: Socially Isolated (02/01/2022)   Social Connection and Isolation Panel [NHANES]    Frequency of Communication with Friends and Family: More than three times a week    Frequency of Social Gatherings with Friends and Family: Once a week    Attends Religious Services: Never    Marine scientist or Organizations: No    Attends Club or  Organization Meetings: Never    Marital Status: Widowed    Review of Systems Per HPI  Objective:  BP 128/82   Pulse 82   Temp 98.2 F (36.8 C) (Oral)   Ht 5' 7.5" (1.715 m)   Wt 162 lb (73.5 kg)   SpO2 98%   BMI 25.00 kg/m      03/26/2022    2:28 PM 02/01/2022    8:26 AM 01/01/2022    9:02 AM  BP/Weight  Systolic BP 282  060  Diastolic BP 82  74  Wt. (Lbs) 162 162   BMI 25 kg/m2 25 kg/m2     Physical Exam Vitals and nursing note reviewed.  Constitutional:      General: He is not in acute distress.    Appearance: Normal appearance.  HENT:     Head: Normocephalic and atraumatic.     Mouth/Throat:     Pharynx: Oropharynx is clear.  Eyes:     General:        Right eye: No discharge.         Left eye: No discharge.     Conjunctiva/sclera: Conjunctivae normal.  Cardiovascular:     Rate and Rhythm: Normal rate and regular rhythm.  Pulmonary:     Effort: Pulmonary effort is normal.     Breath sounds: Normal breath sounds. No wheezing, rhonchi or rales.  Neurological:     Mental Status: He is alert.     Lab Results  Component Value Date   WBC 7.1 01/05/2022   HGB 15.6 01/05/2022   HCT 47.3 01/05/2022   PLT 181 01/05/2022   GLUCOSE 81 08/01/2021   CHOL 138 01/05/2022   TRIG 111 01/05/2022   HDL 40 01/05/2022   LDLCALC 78 01/05/2022   ALT 20 01/05/2022   AST 21 01/05/2022   NA 142 08/01/2021   K 4.2 08/01/2021   CL 102 08/01/2021   CREATININE 0.96 08/01/2021   BUN 9 08/01/2021   CO2 24 08/01/2021   TSH 0.932 01/05/2022   PSA 1.25 08/11/2013     Assessment & Plan:   Problem List Items Addressed This Visit       Other   Acute cough - Primary    Given reports of temperature today and the persistence of his symptoms, chest x-ray was obtained.  Chest x-ray was independent reviewed by me.  Interpretation: Normal chest x-ray.  No evidence of infiltrate. Given patient's history of asthma I am placing him on prednisone.  Awaiting COVID, flu, RSV testing.      Relevant Orders   DG Chest 2 View (Completed)   COVID-19, Flu A+B and RSV    Meds ordered this encounter  Medications   predniSONE (DELTASONE) 50 MG tablet    Sig: 1 tablet daily x 5 days    Dispense:  5 tablet    Refill:  0    Follow-up:  Return if symptoms worsen or fail to improve.  [place]

## 2022-03-26 NOTE — Patient Instructions (Addendum)
Chest xray today.  We will call with results.  Take care  Dr. Lacinda Axon

## 2022-03-26 NOTE — Assessment & Plan Note (Signed)
Given reports of temperature today and the persistence of his symptoms, chest x-ray was obtained.  Chest x-ray was independent reviewed by me.  Interpretation: Normal chest x-ray.  No evidence of infiltrate. Given patient's history of asthma I am placing him on prednisone.  Awaiting COVID, flu, RSV testing.

## 2022-03-28 ENCOUNTER — Other Ambulatory Visit: Payer: Self-pay

## 2022-03-28 ENCOUNTER — Other Ambulatory Visit: Payer: Self-pay | Admitting: Family Medicine

## 2022-03-28 ENCOUNTER — Telehealth: Payer: Self-pay

## 2022-03-28 DIAGNOSIS — U071 COVID-19: Secondary | ICD-10-CM

## 2022-03-28 LAB — COVID-19, FLU A+B AND RSV
Influenza A, NAA: NOT DETECTED
Influenza B, NAA: NOT DETECTED
RSV, NAA: NOT DETECTED
SARS-CoV-2, NAA: DETECTED — AB

## 2022-03-28 MED ORDER — NIRMATRELVIR/RITONAVIR (PAXLOVID)TABLET: 3.0000 | ORAL_TABLET | Freq: Two times a day (BID) | ORAL | 0 refills | Status: DC

## 2022-03-28 MED ORDER — NIRMATRELVIR/RITONAVIR (PAXLOVID)TABLET: 3.0000 | ORAL_TABLET | Freq: Two times a day (BID) | ORAL | 0 refills | Status: AC

## 2022-03-28 NOTE — Telephone Encounter (Signed)
Caller name: Thomas [name]  On DPR?: Yes  Call back number: [phone] (mobile)  Provider they see: [name], MD  Reason for call:pt called he tested positive for Covid and Dr Lacinda Axon sent nirmatrelvir/ritonavir EUA (PAXLOVID) 20 x 150 MG & 10 x '100MG'$  TABS to [place] they are completley out pt called [facility] and they have it can this get resent to [place]

## 2022-03-28 NOTE — Telephone Encounter (Signed)
[facility] [place] .

## 2022-04-03 ENCOUNTER — Other Ambulatory Visit: Payer: Self-pay | Admitting: Family Medicine

## 2022-04-03 ENCOUNTER — Ambulatory Visit: Payer: Medicare Other | Admitting: Family Medicine

## 2022-05-03 ENCOUNTER — Telehealth: Payer: Self-pay

## 2022-05-03 DIAGNOSIS — R1310 Dysphagia, unspecified: Secondary | ICD-10-CM

## 2022-05-03 NOTE — Addendum Note (Signed)
Addended by: Dairl Ponder on: 05/03/2022 03:46 PM   Modules accepted: Orders

## 2022-05-03 NOTE — Telephone Encounter (Signed)
Please advise. Thank you

## 2022-05-03 NOTE — Telephone Encounter (Signed)
Referral ordered in EPIC. 

## 2022-05-03 NOTE — Telephone Encounter (Signed)
Thomas [name] come in today wants a referral he is having trouble swalloning again. Name of the doctor is Maylon Peppers MD [facility] [place] [phone]

## 2022-05-03 NOTE — Telephone Encounter (Signed)
Please go ahead with referral to Dr. Jenetta Downer due to dysphagia

## 2022-05-07 ENCOUNTER — Encounter (INDEPENDENT_AMBULATORY_CARE_PROVIDER_SITE_OTHER): Payer: Self-pay | Admitting: *Deleted

## 2022-05-09 ENCOUNTER — Other Ambulatory Visit: Payer: Self-pay | Admitting: Family Medicine

## 2022-06-19 ENCOUNTER — Ambulatory Visit (INDEPENDENT_AMBULATORY_CARE_PROVIDER_SITE_OTHER): Payer: Medicare Other | Admitting: Gastroenterology

## 2022-06-19 ENCOUNTER — Telehealth (INDEPENDENT_AMBULATORY_CARE_PROVIDER_SITE_OTHER): Payer: Self-pay | Admitting: Gastroenterology

## 2022-06-19 ENCOUNTER — Encounter (INDEPENDENT_AMBULATORY_CARE_PROVIDER_SITE_OTHER): Payer: Self-pay | Admitting: Gastroenterology

## 2022-06-19 VITALS — BP 149/78 | HR 77 | Temp 97.9°F | Ht 67.5 in | Wt 159.2 lb

## 2022-06-19 DIAGNOSIS — R1310 Dysphagia, unspecified: Secondary | ICD-10-CM

## 2022-06-19 DIAGNOSIS — K219 Gastro-esophageal reflux disease without esophagitis: Secondary | ICD-10-CM | POA: Diagnosis not present

## 2022-06-19 MED ORDER — PANTOPRAZOLE SODIUM 40 MG PO TBEC: 40.0000 mg | DELAYED_RELEASE_TABLET | Freq: Every day | ORAL | 2 refills | Status: DC

## 2022-06-19 MED ORDER — SUCRALFATE 1 G PO TABS: 1.0000 g | ORAL_TABLET | Freq: Three times a day (TID) | ORAL | 0 refills | Status: DC

## 2022-06-19 NOTE — Patient Instructions (Signed)
I have sent pantoprazole '40mg'$  to your pharmacy Please take this 30 minutes prior to breakfast Avoid greasy, spicy, fried, citrus foods, and be mindful that caffeine, carbonated drinks, chocolate and alcohol can increase reflux symptoms Stay upright 2-3 hours after eating, prior to lying down and avoid eating late in the evenings. I have also sent carafate 1g tablets, please dissolve tablet in 1/2 oz of water, mix and drink the slurry, prior to meals and at bedtime to help with the raw feeling in her esophagus We will get you scheduled for upper endoscopy for further evaluation, in the meantime, take small bites, chew thoroughly, take sips of liquids between bite and avoid thicker, drier foods  Follow up 3 months  It was a pleasure to see you today. I want to create trusting relationships with patients and provide genuine, compassionate, and quality care. I truly value your feedback! please be on the lookout for a survey regarding your visit with me today. I appreciate your input about our visit and your time in completing this!    Endre Coutts L. Alver Sorrow, MSN, APRN, AGNP-C Adult-Gerontology Nurse Practitioner [facility]

## 2022-06-19 NOTE — Telephone Encounter (Signed)
Attempted to reach pt to scheduled EDG +/- dilatation ASA 3 Endo 3 (dysphagia, odynophagia). Pt picked up but writer unable to hear patient. Will try to call back later.

## 2022-06-19 NOTE — Progress Notes (Signed)
Referring Provider: [name], MD Primary Care Physician:  [name], MD Primary GI Physician: new   Chief Complaint  Patient presents with   Dysphagia    Referred for dysphagia. Reports he had esophagus stretched in past 2 or 3 times. Reports a raw feeling. Feels like food gets stuck. Stopped omeprazole around September due to thinking it was causing diarrhea. Diarrhea did get better after stopping.    HPI:   Thomas [name] is a 88 y.o. male with past medical history of arthritis, GERD, hiatal hernia, HTN, hypothyroidism, polycythemia, sleep apnea (non CPAP)  Patient presenting today as a new patient for dysphagia.  Patient states back in January he choked on some roast beef . It took him about 4 hours to get the food to pass. He notes some "raw" like feeling in his esophagus since then. continues to have issues with certain foods not wanting to go down well. This happens almost daily can be with liquids, pills or foods. Usually substance will go on down. He notes dysphagia on and off for years, has had esophagus dilated in the past. He tries to eat slow and chew well. He is having some discomfort in his chest at times, sometimes burning.  Notes he also had covid in January with a lot of coughing. He wonders if this contributed.   Previously having some diarrhea when he saw PCP a few months ago. He stopped omeprazole and diarrhea improved.  He occasionally uses mylanta. He has taken the omeprazole on occasion PRN when symptoms flare up.  denies melena, hematochezia, vomiting, constipation, early satiety or weight loss.   NSAID [id number] tylenol  Social hx:no etoh or tobacco  Fam hx: no crc or liver disease   Last Colonoscopy::2017  - Diverticulosis in the sigmoid colon.                           - External hemorrhoids.                          - No specimens collected.  Last Endoscopy:many years ago   Recommendations:    Past Medical History:  Diagnosis Date    Arthritis    "my entire back is gone"   Asthma    GERD (gastroesophageal reflux disease)    H/O hiatal hernia    Hernia    History of stress test 2011   Hypertension    Hypothyroidism    Kidney stones    Pollen allergies    Polycythemia    Polycythemia, secondary 09/11/2013   Renal calculus    some removed by Cystoscopy, one passed spontaneously   Sleep apnea    mild no cpap    Past Surgical History:  Procedure Laterality Date   APPENDECTOMY     50 years ago   BACK SURGERY     L-4 , L-5  patient states that it was years ago.   COLONOSCOPY     at age 5   COLONOSCOPY N/A 04/06/2016   Procedure: COLONOSCOPY;  Surgeon: Rogene Houston, MD;  [facility];  Service: Endoscopy;  Laterality: N/A;  7:30   EYE SURGERY     cataracts removed- bilateral- /w IOL   INGUINAL HERNIA REPAIR Left 05/14/2016   Procedure: OPEN REPAIR OF SYMPTOMATIC (PAINFUL) LEFT INGUINAL HERNIA WITH MESH;  Surgeon: Vickie Epley, MD;  Location: [facility];  Service: General;  Laterality: Left;   SINOSCOPY     THYROIDECTOMY Right 09/17/2012   Procedure: RIGHT HEMI-THYROIDECTOMY;  Surgeon: Ascencion Dike, MD;  Location: [place];  Service: ENT;  Laterality: Right;   UPPER GASTROINTESTINAL ENDOSCOPY     egd/ed   YAG LASER APPLICATION Left Q000111Q   Procedure: YAG LASER APPLICATION;  Surgeon: Rutherford Guys, MD;  Location: [facility];  Service: Ophthalmology;  Laterality: Left;    Current Outpatient Medications  Medication Sig Dispense Refill   albuterol (PROVENTIL HFA;VENTOLIN HFA) 108 (90 Base) MCG/ACT inhaler Inhale 2 puffs into the lungs every 4 (four) hours as needed. 1 Inhaler 5   amLODipine (NORVASC) 2.5 MG tablet TAKE 1 TABLET BY MOUTH ONCE DAILY. 90 tablet 0   budesonide-formoterol (SYMBICORT) 80-4.5 MCG/ACT inhaler Inhale 1 puff into the lungs 2 (two) times daily. 1 each 12   fluticasone (FLONASE) 50 MCG/ACT nasal spray USE 1 SPRAY IN EACH NOSTRIL TWICE DAILY. 16 g 0   Hypromellose (ARTIFICIAL TEARS  OP) Place 1 drop into both eyes daily as needed (dry eyes).     ibuprofen (ADVIL,MOTRIN) 200 MG tablet Take 400 mg by mouth 2 (two) times daily as needed for headache or moderate pain.     ketoconazole (NIZORAL) 2 % cream APPLY TO AFFECTED AREA TWO TIMES DAILY AS NEEDED FOR IRRITATION. 60 g 2   levothyroxine (SYNTHROID) 100 MCG tablet TAKE ONE TABLET BY MOUTH DAILY EXCEPT ON SUNDAY AND THURSDAY TAKE 1/2 TABLET. TAKE BEFORE BREAKFAST. 78 tablet 0   mometasone (ELOCON) 0.1 % cream APPLY TO AFFECTED AREAS TWICE DAILY AS NEEDED. 45 g 0   pravastatin (PRAVACHOL) 40 MG tablet Take 1 tablet (40 mg total) by mouth at bedtime. 90 tablet 1   triamcinolone cream (KENALOG) 0.1 % Apply 1 application topically 2 (two) times daily. 45 g 2   valsartan (DIOVAN) 80 MG tablet TAKE (1) TABLET BY MOUTH ONCE DAILY. 90 tablet 1   dicyclomine (BENTYL) 10 MG capsule One taking 3 times daily as needed for lower abdominal discomfort 30 capsule 2   omeprazole (PRILOSEC) 20 MG capsule TAKE (1) CAPSULE BY MOUTH ONCE DAILY. (Patient not taking: Reported on 06/19/2022) 90 capsule 0   ondansetron (ZOFRAN) 8 MG tablet Take 1 tablet (8 mg total) by mouth every 8 (eight) hours as needed for nausea. (Patient not taking: Reported on 06/19/2022) 15 tablet 0   No current facility-administered medications for this visit.    Allergies as of 06/19/2022 - Review Complete 06/19/2022  Allergen Reaction Noted   Penicillins Other (See Comments) 12/09/2009    Family History  Problem Relation Age of Onset   Heart disease Mother    Heart disease Father    Heart disease Brother    Healthy Son    Allergic rhinitis Neg Hx    Angioedema Neg Hx    Asthma Neg Hx    Atopy Neg Hx    Eczema Neg Hx    Immunodeficiency Neg Hx    Urticaria Neg Hx     Social History   Socioeconomic History   Marital status: Widowed    Spouse name: Not on file   Number of children: Not on file   Years of education: Not on file   Highest education level: Not  on file  Occupational History   Not on file  Tobacco Use   Smoking status: Never    Passive exposure: Never   Smokeless tobacco: Former    Types: Chew   Tobacco comments:  Patient states that he has been chewing for 60 plus years  Substance and Sexual Activity   Alcohol use: No    Comment: Patient states that he drinks a bout a case of beer a year   Drug use: No   Sexual activity: Never  Other Topics Concern   Not on file  Social History Narrative   Not on file   Social Determinants of Health   Financial Resource Strain: Low Risk  (02/01/2022)   Overall Financial Resource Strain (CARDIA)    Difficulty of Paying Living Expenses: Not hard at all  Food Insecurity: No Food Insecurity (02/01/2022)   Hunger Vital Sign    Worried About Running Out of Food in the Last Year: Never true    Ran Out of Food in the Last Year: Never true  Transportation Needs: No Transportation Needs (02/01/2022)   PRAPARE - Hydrologist (Medical): No    Lack of Transportation (Non-Medical): No  Physical Activity: Sufficiently Active (02/01/2022)   Exercise Vital Sign    Days of Exercise per Week: 5 days    Minutes of Exercise per Session: 50 min  Stress: No Stress Concern Present (02/01/2022)   [place]    Feeling of Stress : Not at all  Social Connections: Socially Isolated (02/01/2022)   Social Connection and Isolation Panel [NHANES]    Frequency of Communication with Friends and Family: More than three times a week    Frequency of Social Gatherings with Friends and Family: Once a week    Attends Religious Services: Never    Marine scientist or Organizations: No    Attends Archivist Meetings: Never    Marital Status: Widowed    Review of systems General: negative for malaise, night sweats, fever, chills, weight loss Neck: Negative for lumps, goiter, pain and significant neck  swelling Resp: Negative for cough, wheezing, dyspnea at rest CV: Negative for chest pain, leg swelling, palpitations, orthopnea GI: denies melena, hematochezia, nausea, vomiting, diarrhea, constipation, early satiety or unintentional weight loss. +dysphagia +GERD +odynophagia  MSK: Negative for joint pain or swelling, back pain, and muscle pain. Derm: Negative for itching or rash Psych: Denies depression, anxiety, memory loss, confusion. No homicidal or suicidal ideation.  Heme: Negative for prolonged bleeding, bruising easily, and swollen nodes. Endocrine: Negative for cold or heat intolerance, polyuria, polydipsia and goiter. Neuro: negative for tremor, gait imbalance, syncope and seizures. The remainder of the review of systems is noncontributory.  Physical Exam: BP (!) 162/82 (BP Location: Left Arm, Patient Position: Sitting, Cuff Size: Normal)   Pulse 77   Temp 97.9 F (36.6 C) (Oral)   Ht 5' 7.5" (1.715 m)   Wt 159 lb 3.2 oz (72.2 kg)   BMI 24.57 kg/m  General:   Alert and oriented. No distress noted. Pleasant and cooperative.  Head:  Normocephalic and atraumatic. Eyes:  Conjuctiva clear without scleral icterus. Mouth:  Oral mucosa pink and moist. Good dentition. No lesions. Heart: Normal rate and rhythm, s1 and s2 heart sounds present.  Lungs: Clear lung sounds in all lobes. Respirations equal and unlabored. Abdomen:  +BS, soft, non-tender and non-distended. No rebound or guarding. No HSM or masses noted. Derm: No palmar erythema or jaundice Msk:  Symmetrical without gross deformities. Normal posture. Extremities:  Without edema. Neurologic:  Alert and  oriented x4 Psych:  Alert and cooperative. Normal mood and affect.  Invalid input(s): "6 MONTHS"  ASSESSMENT: Thomas [name] is a 88 y.o. male presenting today as a new patient for GERD/dysphagia.  History of GERD previously maintained on omeprazole, however he stopped this when he began having diarrhea.  Diarrhea  resolved thereafter.  He had a significant episode of dysphagia in January where food bolus took approximately 4 hours to pass after each emergency.  He notes some continued "rawness" in his esophagus since then.  He is having heartburn.  Has issues with dysphagia almost daily.  He has had to have his esophagus dilated in the distant past.  Will start pantoprazole 40 mg once daily for GERD and send Carafate 1 g 4 times daily for the discomfort in his esophagus, this could be secondary to previous food bolus, esophagitis or is uncontrolled GERD.  Recommend proceeding with upper endoscopy plus or minus dilation for further evaluation as I cannot rule out esophageal stricture, stenosis, web, ring, esophagitis.  Indications, risks and benefits of procedure discussed in detail with patient. Patient verbalized understanding and is in agreement to proceed with EGD +/-dilation.   The patient was found to have elevated blood pressure when vital signs were checked in the office. The blood pressure was rechecked by the nursing staff and it was found be persistently elevated >140/90 mmHg. I personally advised to the patient to follow up closely with PCP for hypertension control.   PLAN:  Pantoprazole '40mg'$  daily, reflux precautions  2. Chewing precautions  3. EGD +/- dilation ASA III ENDO 3 4. Carafate 1g QID, dissolve pill in 1/2 water, mix and drink  All questions were answered, patient verbalized understanding and is in agreement with plan as outlined above.    Follow Up: 3 months   Graciemae Delisle L. Alver Sorrow, MSN, APRN, AGNP-C Adult-Gerontology Nurse Practitioner [facility]  I have reviewed the note and agree with the APP's assessment as described in this progress note  Maylon Peppers, MD Gastroenterology and Hepatology [facility]

## 2022-06-19 NOTE — H&P (View-Only) (Signed)
Referring Provider: [name], MD Primary Care Physician:  [name], MD Primary GI Physician: new   Chief Complaint  Patient presents with   Dysphagia    Referred for dysphagia. Reports he had esophagus stretched in past 2 or 3 times. Reports a raw feeling. Feels like food gets stuck. Stopped omeprazole around September due to thinking it was causing diarrhea. Diarrhea did get better after stopping.    HPI:   Thomas [name] is a 88 y.o. male with past medical history of arthritis, GERD, hiatal hernia, HTN, hypothyroidism, polycythemia, sleep apnea (non CPAP)  Patient presenting today as a new patient for dysphagia.  Patient states back in January he choked on some roast beef . It took him about 4 hours to get the food to pass. He notes some "raw" like feeling in his esophagus since then. continues to have issues with certain foods not wanting to go down well. This happens almost daily can be with liquids, pills or foods. Usually substance will go on down. He notes dysphagia on and off for years, has had esophagus dilated in the past. He tries to eat slow and chew well. He is having some discomfort in his chest at times, sometimes burning.  Notes he also had covid in January with a lot of coughing. He wonders if this contributed.   Previously having some diarrhea when he saw PCP a few months ago. He stopped omeprazole and diarrhea improved.  He occasionally uses mylanta. He has taken the omeprazole on occasion PRN when symptoms flare up.  denies melena, hematochezia, vomiting, constipation, early satiety or weight loss.   NSAID [id number] tylenol  Social hx:no etoh or tobacco  Fam hx: no crc or liver disease   Last Colonoscopy::2017  - Diverticulosis in the sigmoid colon.                           - External hemorrhoids.                          - No specimens collected.  Last Endoscopy:many years ago   Recommendations:    Past Medical History:  Diagnosis Date    Arthritis    "my entire back is gone"   Asthma    GERD (gastroesophageal reflux disease)    H/O hiatal hernia    Hernia    History of stress test 2011   Hypertension    Hypothyroidism    Kidney stones    Pollen allergies    Polycythemia    Polycythemia, secondary 09/11/2013   Renal calculus    some removed by Cystoscopy, one passed spontaneously   Sleep apnea    mild no cpap    Past Surgical History:  Procedure Laterality Date   APPENDECTOMY     50 years ago   BACK SURGERY     L-4 , L-5  patient states that it was years ago.   COLONOSCOPY     at age 5   COLONOSCOPY N/A 04/06/2016   Procedure: COLONOSCOPY;  Surgeon: Rogene Houston, MD;  [facility];  Service: Endoscopy;  Laterality: N/A;  7:30   EYE SURGERY     cataracts removed- bilateral- /w IOL   INGUINAL HERNIA REPAIR Left 05/14/2016   Procedure: OPEN REPAIR OF SYMPTOMATIC (PAINFUL) LEFT INGUINAL HERNIA WITH MESH;  Surgeon: Vickie Epley, MD;  Location: [facility];  Service: General;  Laterality: Left;   SINOSCOPY     THYROIDECTOMY Right 09/17/2012   Procedure: RIGHT HEMI-THYROIDECTOMY;  Surgeon: Ascencion Dike, MD;  Location: [place];  Service: ENT;  Laterality: Right;   UPPER GASTROINTESTINAL ENDOSCOPY     egd/ed   YAG LASER APPLICATION Left Q000111Q   Procedure: YAG LASER APPLICATION;  Surgeon: Rutherford Guys, MD;  Location: [facility];  Service: Ophthalmology;  Laterality: Left;    Current Outpatient Medications  Medication Sig Dispense Refill   albuterol (PROVENTIL HFA;VENTOLIN HFA) 108 (90 Base) MCG/ACT inhaler Inhale 2 puffs into the lungs every 4 (four) hours as needed. 1 Inhaler 5   amLODipine (NORVASC) 2.5 MG tablet TAKE 1 TABLET BY MOUTH ONCE DAILY. 90 tablet 0   budesonide-formoterol (SYMBICORT) 80-4.5 MCG/ACT inhaler Inhale 1 puff into the lungs 2 (two) times daily. 1 each 12   fluticasone (FLONASE) 50 MCG/ACT nasal spray USE 1 SPRAY IN EACH NOSTRIL TWICE DAILY. 16 g 0   Hypromellose (ARTIFICIAL TEARS  OP) Place 1 drop into both eyes daily as needed (dry eyes).     ibuprofen (ADVIL,MOTRIN) 200 MG tablet Take 400 mg by mouth 2 (two) times daily as needed for headache or moderate pain.     ketoconazole (NIZORAL) 2 % cream APPLY TO AFFECTED AREA TWO TIMES DAILY AS NEEDED FOR IRRITATION. 60 g 2   levothyroxine (SYNTHROID) 100 MCG tablet TAKE ONE TABLET BY MOUTH DAILY EXCEPT ON SUNDAY AND THURSDAY TAKE 1/2 TABLET. TAKE BEFORE BREAKFAST. 78 tablet 0   mometasone (ELOCON) 0.1 % cream APPLY TO AFFECTED AREAS TWICE DAILY AS NEEDED. 45 g 0   pravastatin (PRAVACHOL) 40 MG tablet Take 1 tablet (40 mg total) by mouth at bedtime. 90 tablet 1   triamcinolone cream (KENALOG) 0.1 % Apply 1 application topically 2 (two) times daily. 45 g 2   valsartan (DIOVAN) 80 MG tablet TAKE (1) TABLET BY MOUTH ONCE DAILY. 90 tablet 1   dicyclomine (BENTYL) 10 MG capsule One taking 3 times daily as needed for lower abdominal discomfort 30 capsule 2   omeprazole (PRILOSEC) 20 MG capsule TAKE (1) CAPSULE BY MOUTH ONCE DAILY. (Patient not taking: Reported on 06/19/2022) 90 capsule 0   ondansetron (ZOFRAN) 8 MG tablet Take 1 tablet (8 mg total) by mouth every 8 (eight) hours as needed for nausea. (Patient not taking: Reported on 06/19/2022) 15 tablet 0   No current facility-administered medications for this visit.    Allergies as of 06/19/2022 - Review Complete 06/19/2022  Allergen Reaction Noted   Penicillins Other (See Comments) 12/09/2009    Family History  Problem Relation Age of Onset   Heart disease Mother    Heart disease Father    Heart disease Brother    Healthy Son    Allergic rhinitis Neg Hx    Angioedema Neg Hx    Asthma Neg Hx    Atopy Neg Hx    Eczema Neg Hx    Immunodeficiency Neg Hx    Urticaria Neg Hx     Social History   Socioeconomic History   Marital status: Widowed    Spouse name: Not on file   Number of children: Not on file   Years of education: Not on file   Highest education level: Not  on file  Occupational History   Not on file  Tobacco Use   Smoking status: Never    Passive exposure: Never   Smokeless tobacco: Former    Types: Chew   Tobacco comments:  Patient states that he has been chewing for 60 plus years  Substance and Sexual Activity   Alcohol use: No    Comment: Patient states that he drinks a bout a case of beer a year   Drug use: No   Sexual activity: Never  Other Topics Concern   Not on file  Social History Narrative   Not on file   Social Determinants of Health   Financial Resource Strain: Low Risk  (02/01/2022)   Overall Financial Resource Strain (CARDIA)    Difficulty of Paying Living Expenses: Not hard at all  Food Insecurity: No Food Insecurity (02/01/2022)   Hunger Vital Sign    Worried About Running Out of Food in the Last Year: Never true    Ran Out of Food in the Last Year: Never true  Transportation Needs: No Transportation Needs (02/01/2022)   PRAPARE - Hydrologist (Medical): No    Lack of Transportation (Non-Medical): No  Physical Activity: Sufficiently Active (02/01/2022)   Exercise Vital Sign    Days of Exercise per Week: 5 days    Minutes of Exercise per Session: 50 min  Stress: No Stress Concern Present (02/01/2022)   [place]    Feeling of Stress : Not at all  Social Connections: Socially Isolated (02/01/2022)   Social Connection and Isolation Panel [NHANES]    Frequency of Communication with Friends and Family: More than three times a week    Frequency of Social Gatherings with Friends and Family: Once a week    Attends Religious Services: Never    Marine scientist or Organizations: No    Attends Archivist Meetings: Never    Marital Status: Widowed    Review of systems General: negative for malaise, night sweats, fever, chills, weight loss Neck: Negative for lumps, goiter, pain and significant neck  swelling Resp: Negative for cough, wheezing, dyspnea at rest CV: Negative for chest pain, leg swelling, palpitations, orthopnea GI: denies melena, hematochezia, nausea, vomiting, diarrhea, constipation, early satiety or unintentional weight loss. +dysphagia +GERD +odynophagia  MSK: Negative for joint pain or swelling, back pain, and muscle pain. Derm: Negative for itching or rash Psych: Denies depression, anxiety, memory loss, confusion. No homicidal or suicidal ideation.  Heme: Negative for prolonged bleeding, bruising easily, and swollen nodes. Endocrine: Negative for cold or heat intolerance, polyuria, polydipsia and goiter. Neuro: negative for tremor, gait imbalance, syncope and seizures. The remainder of the review of systems is noncontributory.  Physical Exam: BP (!) 162/82 (BP Location: Left Arm, Patient Position: Sitting, Cuff Size: Normal)   Pulse 77   Temp 97.9 F (36.6 C) (Oral)   Ht 5' 7.5" (1.715 m)   Wt 159 lb 3.2 oz (72.2 kg)   BMI 24.57 kg/m  General:   Alert and oriented. No distress noted. Pleasant and cooperative.  Head:  Normocephalic and atraumatic. Eyes:  Conjuctiva clear without scleral icterus. Mouth:  Oral mucosa pink and moist. Good dentition. No lesions. Heart: Normal rate and rhythm, s1 and s2 heart sounds present.  Lungs: Clear lung sounds in all lobes. Respirations equal and unlabored. Abdomen:  +BS, soft, non-tender and non-distended. No rebound or guarding. No HSM or masses noted. Derm: No palmar erythema or jaundice Msk:  Symmetrical without gross deformities. Normal posture. Extremities:  Without edema. Neurologic:  Alert and  oriented x4 Psych:  Alert and cooperative. Normal mood and affect.  Invalid input(s): "6 MONTHS"  ASSESSMENT: Thomas [name] is a 88 y.o. male presenting today as a new patient for GERD/dysphagia.  History of GERD previously maintained on omeprazole, however he stopped this when he began having diarrhea.  Diarrhea  resolved thereafter.  He had a significant episode of dysphagia in January where food bolus took approximately 4 hours to pass after each emergency.  He notes some continued "rawness" in his esophagus since then.  He is having heartburn.  Has issues with dysphagia almost daily.  He has had to have his esophagus dilated in the distant past.  Will start pantoprazole 40 mg once daily for GERD and send Carafate 1 g 4 times daily for the discomfort in his esophagus, this could be secondary to previous food bolus, esophagitis or is uncontrolled GERD.  Recommend proceeding with upper endoscopy plus or minus dilation for further evaluation as I cannot rule out esophageal stricture, stenosis, web, ring, esophagitis.  Indications, risks and benefits of procedure discussed in detail with patient. Patient verbalized understanding and is in agreement to proceed with EGD +/-dilation.   The patient was found to have elevated blood pressure when vital signs were checked in the office. The blood pressure was rechecked by the nursing staff and it was found be persistently elevated >140/90 mmHg. I personally advised to the patient to follow up closely with PCP for hypertension control.   PLAN:  Pantoprazole '40mg'$  daily, reflux precautions  2. Chewing precautions  3. EGD +/- dilation ASA III ENDO 3 4. Carafate 1g QID, dissolve pill in 1/2 water, mix and drink  All questions were answered, patient verbalized understanding and is in agreement with plan as outlined above.    Follow Up: 3 months   Katiria Calame L. Alver Sorrow, MSN, APRN, AGNP-C Adult-Gerontology Nurse Practitioner [facility]  I have reviewed the note and agree with the APP's assessment as described in this progress note  Maylon Peppers, MD Gastroenterology and Hepatology [facility] Gastroenter[facility]

## 2022-06-27 NOTE — Telephone Encounter (Signed)
Contacted pt and scheduled EGD for 07/17/22. Instructions will be sen via mail. Will call pt with pre op appt.   Per [id number] or Prior Authorization is not required for the requested services You are not required to submit a notification/prior authorization based on the information provided. The number above acknowledges your inquiry and our response. Please write this number down and refer to it for future inquiries. If you still wish to submit your request for review, please select the Continue with Submission button below. Decision ID #: [id number]

## 2022-06-28 ENCOUNTER — Other Ambulatory Visit: Payer: Self-pay | Admitting: Family Medicine

## 2022-06-28 NOTE — Telephone Encounter (Signed)
Pre op appt scheduled for 07/16/22 in person at [facility]. Attempted to reach pt; unable to hear pt.

## 2022-07-03 ENCOUNTER — Ambulatory Visit (INDEPENDENT_AMBULATORY_CARE_PROVIDER_SITE_OTHER): Payer: Medicare Other | Admitting: Family Medicine

## 2022-07-03 VITALS — BP 128/82 | Ht 67.5 in | Wt 162.6 lb

## 2022-07-03 DIAGNOSIS — E7849 Other hyperlipidemia: Secondary | ICD-10-CM | POA: Diagnosis not present

## 2022-07-03 DIAGNOSIS — D751 Secondary polycythemia: Secondary | ICD-10-CM | POA: Diagnosis not present

## 2022-07-03 DIAGNOSIS — E038 Other specified hypothyroidism: Secondary | ICD-10-CM | POA: Diagnosis not present

## 2022-07-03 DIAGNOSIS — Z79899 Other long term (current) drug therapy: Secondary | ICD-10-CM | POA: Diagnosis not present

## 2022-07-03 DIAGNOSIS — I10 Essential (primary) hypertension: Secondary | ICD-10-CM

## 2022-07-03 MED ORDER — VALSARTAN 80 MG PO TABS: ORAL_TABLET | ORAL | 1 refills | Status: DC

## 2022-07-03 MED ORDER — PRAVASTATIN SODIUM 40 MG PO TABS: 40.0000 mg | ORAL_TABLET | Freq: Every day | ORAL | 1 refills | Status: DC

## 2022-07-03 MED ORDER — LEVOTHYROXINE SODIUM 100 MCG PO TABS: ORAL_TABLET | ORAL | 1 refills | Status: DC

## 2022-07-03 MED ORDER — AMLODIPINE BESYLATE 2.5 MG PO TABS: 2.5000 mg | ORAL_TABLET | Freq: Every day | ORAL | 1 refills | Status: DC

## 2022-07-03 NOTE — Progress Notes (Signed)
   Subjective:    Patient ID: Thomas [name], male    DOB: 1935-03-03, 88 y.o.   MRN: [id number]  Hypertension This is a chronic problem. The current episode started more than 1 year ago. Risk factors for coronary artery disease include dyslipidemia and male gender. Treatments tried: norvasc.   Patient for blood pressure check up.  The patient does have hypertension.   Patient relates dietary measures try to minimize salt The importance of healthy diet and activity were discussed Patient relates compliance  Patient here for follow-up regarding cholesterol.    Patient relates taking medication on a regular basis Denies problems with medication Importance of dietary measures discussed Regular lab work regarding lipid and liver was checked and if needing additional labs was appropriately ordered  Takes his thyroid medicine regular basis denies any problems No fever sweats chills or weight loss Dietary overall doing well Reflux under good control with medication Has stretching of the esophagus coming up in the next few weeks  Review of Systems     Objective:   Physical Exam General-in no acute distress Eyes-no discharge Lungs-respiratory rate normal, CTA CV-no murmurs,RRR Extremities skin warm dry no edema Neuro grossly normal Behavior normal, alert        Assessment & Plan:  1. HTN (hypertension), benign Blood pressure decent control, patient to do lab work to look at kidney function  2. Other hyperlipidemia Hyperlipidemia-importance of diet, weight control, activity, compliance with medications discussed.   Recent labs reviewed.   Any additional labs or refills ordered.   Importance of keeping under good control discussed. Regular follow-up visits discussed   3. Polycythemia, secondary Check CBC monitor in the past this is been under the care of hematology but they have asked [place] to follow this which is reasonable  4. Other specified hypothyroidism Continue  thyroid medicine check lab work await results  Patient states his overall energy level relatively good has no major complaints  Follow-up approximately 5 to 6 months

## 2022-07-03 NOTE — Patient Instructions (Signed)
Hi Thomas [name]  It was good to see you today Please do your lab work before your next visit in August Let us know if we can be of any help before then Please take [place] luck with your procedure in early April

## 2022-07-03 NOTE — Telephone Encounter (Signed)
Pt contacted and made aware of pre op appt on Monday 07/16/22 at 10:00 in person at [facility]. Went over EGD instructions went pt also.

## 2022-07-09 ENCOUNTER — Other Ambulatory Visit: Payer: Self-pay | Admitting: Family Medicine

## 2022-07-11 NOTE — Patient Instructions (Signed)
WYLIE BRUSKI  07/11/2022     @PREFPERIOPPHARMACY @   Your procedure is scheduled on  07/17/2022.   Report to [facility] at  1145  A.M.   Call this number if you have problems the morning of surgery:  [phone]  If you experience any cold or flu symptoms such as cough, fever, chills, shortness of breath, etc. between now and your scheduled surgery, please notify us at the above number.   Remember:  Follow the diet instructions given to you by the office.     Use your inhalers before you come and bring your rescue inhaler with you.     Take these medicines the morning of surgery with A SIP OF WATER           amlodipine, levothyroxine, pantoprazole.     Do not wear jewelry, make-up or nail polish.  Do not wear lotions, powders, or perfumes, or deodorant.  Do not shave 48 hours prior to surgery.  Men may shave face and neck.  Do not bring valuables to the hospital.  [facility] is not responsible for any belongings or valuables.  Contacts, dentures or bridgework may not be worn into surgery.  Leave your suitcase in the car.  After surgery it may be brought to your room.  For patients admitted to the hospital, discharge time will be determined by your treatment team.  Patients discharged the day of surgery will not be allowed to drive home and must have someone with them for 24 hours.    Special instructions:   DO NOT smoke tobacco or vape for 24 hours before your procedure.  Please read over the following fact sheets that you were given. Anesthesia Post-op Instructions and Care and Recovery After Surgery      Upper Endoscopy, Adult, Care After After the procedure, it is common to have a sore throat. It is also common to have: Mild stomach pain or discomfort. Bloating. Nausea. Follow these instructions at home: The instructions below may help you care for yourself at home. Your health care provider may give you more instructions. If you have  questions, ask your health care provider. If you were given a sedative during the procedure, it can affect you for several hours. Do not drive or operate machinery until your health care provider says that it is safe. If you will be going home right after the procedure, plan to have a responsible adult: Take you home from the hospital or clinic. You will not be allowed to drive. Care for you for the time you are told. Follow instructions from your health care provider about what you may eat and drink. Return to your normal activities as told by your health care provider. Ask your health care provider what activities are safe for you. Take over-the-counter and prescription medicines only as told by your health care provider. Contact a health care provider if you: Have a sore throat that lasts longer than one day. Have trouble swallowing. Have a fever. Get help right away if you: Vomit blood or your vomit looks like coffee grounds. Have bloody, black, or tarry stools. Have a very bad sore throat or you cannot swallow. Have difficulty breathing or very bad pain in your chest or abdomen. These symptoms may be an emergency. Get help right away. Call 911. Do not wait to see if the symptoms will go away. Do not drive yourself to the hospital. Summary After the procedure, it  is common to have a sore throat, mild stomach discomfort, bloating, and nausea. If you were given a sedative during the procedure, it can affect you for several hours. Do not drive until your health care provider says that it is safe. Follow instructions from your health care provider about what you may eat and drink. Return to your normal activities as told by your health care provider. This information is not intended to replace advice given to you by your health care provider. Make sure you discuss any questions you have with your health care provider. Document Revised: 07/12/2021 Document Reviewed: 07/12/2021 Elsevier  Patient Education  [place]. Esophageal Dilatation Esophageal dilatation, also called esophageal dilation, is a procedure to widen or open a blocked or narrowed part of the esophagus. The esophagus is the part of the body that moves food and liquid from the mouth to the stomach. You may need this procedure if: You have a buildup of scar tissue in your esophagus that makes it difficult, painful, or impossible to swallow. This can be caused by gastroesophageal reflux disease (GERD). You have cancer of the esophagus. There is a problem with how food moves through your esophagus. In some cases, you may need this procedure repeated at a later time to dilate the esophagus gradually. Tell a health care provider about: Any allergies you have. All medicines you are taking, including vitamins, herbs, eye drops, creams, and over-the-counter medicines. Any problems you or family members have had with anesthetic medicines. Any blood disorders you have. Any surgeries you have had. Any medical conditions you have. Any antibiotic medicines you are required to take before dental procedures. Whether you are pregnant or may be pregnant. What are the risks? Generally, this is a safe procedure. However, problems may occur, including: Bleeding due to a tear in the lining of the esophagus. A hole, or perforation, in the esophagus. What happens before the procedure? Ask your health care provider about: Changing or stopping your regular medicines. This is especially important if you are taking diabetes medicines or blood thinners. Taking medicines such as aspirin and ibuprofen. These medicines can thin your blood. Do not take these medicines unless your health care provider tells you to take them. Taking over-the-counter medicines, vitamins, herbs, and supplements. Follow instructions from your health care provider about eating or drinking restrictions. Plan to have a responsible adult take you home from  the hospital or clinic. Plan to have a responsible adult care for you for the time you are told after you leave the hospital or clinic. This is important. What happens during the procedure? You may be given a medicine to help you relax (sedative). A numbing medicine may be sprayed into the back of your throat, or you may gargle the medicine. Your health care provider may perform the dilatation using various surgical instruments, such as: Simple dilators. This instrument is carefully placed in the esophagus to stretch it. Guided wire bougies. This involves using an endoscope to insert a wire into the esophagus. A dilator is passed over this wire to enlarge the esophagus. Then the wire is removed. Balloon dilators. An endoscope with a small balloon is inserted into the esophagus. The balloon is inflated to stretch the esophagus and open it up. The procedure may vary among health care providers and hospitals. What can I expect after the procedure? Your blood pressure, heart rate, breathing rate, and blood oxygen level will be monitored until you leave the hospital or clinic. Your throat may feel  slightly sore and numb. This will get better over time. You will not be allowed to eat or drink until your throat is no longer numb. When you are able to drink, urinate, and sit on the edge of the bed without nausea or dizziness, you may be able to return home. Follow these instructions at home: Take over-the-counter and prescription medicines only as told by your health care provider. If you were given a sedative during the procedure, it can affect you for several hours. Do not drive or operate machinery until your health care provider says that it is safe. Plan to have a responsible adult care for you for the time you are told. This is important. Follow instructions from your health care provider about any eating or drinking restrictions. Do not use any products that contain nicotine or tobacco, such as  cigarettes, e-cigarettes, and chewing tobacco. If you need help quitting, ask your health care provider. Keep all follow-up visits. This is important. Contact a health care provider if: You have a fever. You have pain that is not relieved by medicine. Get help right away if: You have chest pain. You have trouble breathing. You have trouble swallowing. You vomit blood. You have black, tarry, or bloody stools. These symptoms may represent a serious problem that is an emergency. Do not wait to see if the symptoms will go away. Get medical help right away. Call your local emergency services (911 in the [place] Do not drive yourself to the hospital. Summary Esophageal dilatation, also called esophageal dilation, is a procedure to widen or open a blocked or narrowed part of the esophagus. Plan to have a responsible adult take you home from the hospital or clinic. For this procedure, a numbing medicine may be sprayed into the back of your throat, or you may gargle the medicine. Do not drive or operate machinery until your health care provider says that it is safe. This information is not intended to replace advice given to you by your health care provider. Make sure you discuss any questions you have with your health care provider. Document Revised: 08/19/2019 Document Reviewed: 08/19/2019 Elsevier Patient Education  [place] After The following information offers guidance on how to care for yourself after your procedure. Your health care provider may also give you more specific instructions. If you have problems or questions, contact your health care provider. What can I expect after the procedure? After the procedure, it is common to have: Tiredness. Little or no memory about what happened during or after the procedure. Impaired judgment when it comes to making decisions. Nausea or vomiting. Some trouble with balance. Follow these instructions at  home: For the time period you were told by your health care provider:  Rest. Do not participate in activities where you could fall or become injured. Do not drive or use machinery. Do not drink alcohol. Do not take sleeping pills or medicines that cause drowsiness. Do not make important decisions or sign legal documents. Do not take care of children on your own. Medicines Take over-the-counter and prescription medicines only as told by your health care provider. If you were prescribed antibiotics, take them as told by your health care provider. Do not stop using the antibiotic even if you start to feel better. Eating and drinking Follow instructions from your health care provider about what you may eat and drink. Drink enough fluid to keep your urine pale yellow. If you vomit: Drink clear fluids slowly  and in small amounts as you are able. Clear fluids include water, ice chips, low-calorie sports drinks, and fruit juice that has water added to it (diluted fruit juice). Eat light and bland foods in small amounts as you are able. These foods include bananas, applesauce, rice, lean meats, toast, and crackers. General instructions  Have a responsible adult stay with you for the time you are told. It is important to have someone help care for you until you are awake and alert. If you have sleep apnea, surgery and some medicines can increase your risk for breathing problems. Follow instructions from your health care provider about wearing your sleep device: When you are sleeping. This includes during daytime naps. While taking prescription pain medicines, sleeping medicines, or medicines that make you drowsy. Do not use any products that contain nicotine or tobacco. These products include cigarettes, chewing tobacco, and vaping devices, such as e-cigarettes. If you need help quitting, ask your health care provider. Contact a health care provider if: You feel nauseous or vomit every time you eat  or drink. You feel light-headed. You are still sleepy or having trouble with balance after 24 hours. You get a rash. You have a fever. You have redness or swelling around the IV site. Get help right away if: You have trouble breathing. You have new confusion after you get home. These symptoms may be an emergency. Get help right away. Call 911. Do not wait to see if the symptoms will go away. Do not drive yourself to the hospital. This information is not intended to replace advice given to you by your health care provider. Make sure you discuss any questions you have with your health care provider. Document Revised: 08/28/2021 Document Reviewed: 08/28/2021 Elsevier Patient Education  [place].

## 2022-07-16 ENCOUNTER — Encounter (HOSPITAL_COMMUNITY)
Admission: RE | Admit: 2022-07-16 | Discharge: 2022-07-16 | Disposition: A | Discharge status: Home / Self Care | Payer: Medicare Other | Source: Ambulatory Visit | Attending: Gastroenterology | Admitting: Gastroenterology

## 2022-07-16 ENCOUNTER — Encounter (HOSPITAL_COMMUNITY): Payer: Self-pay

## 2022-07-16 VITALS — BP 160/70 | HR 58 | Temp 98.3°F | Resp 18 | Ht 67.5 in | Wt 162.6 lb

## 2022-07-16 DIAGNOSIS — I10 Essential (primary) hypertension: Secondary | ICD-10-CM | POA: Diagnosis not present

## 2022-07-16 DIAGNOSIS — Z01818 Encounter for other preprocedural examination: Secondary | ICD-10-CM | POA: Insufficient documentation

## 2022-07-17 ENCOUNTER — Encounter (HOSPITAL_COMMUNITY): Payer: Self-pay | Admitting: Gastroenterology

## 2022-07-17 ENCOUNTER — Encounter (HOSPITAL_COMMUNITY)
Admission: RE | Disposition: A | Discharge status: Home / Self Care | Payer: Self-pay | Source: Home / Self Care | Attending: Gastroenterology

## 2022-07-17 ENCOUNTER — Ambulatory Visit (HOSPITAL_BASED_OUTPATIENT_CLINIC_OR_DEPARTMENT_OTHER): Payer: Medicare Other | Admitting: Certified Registered"

## 2022-07-17 ENCOUNTER — Ambulatory Visit (HOSPITAL_COMMUNITY): Payer: Medicare Other | Admitting: Certified Registered"

## 2022-07-17 ENCOUNTER — Ambulatory Visit (HOSPITAL_COMMUNITY)
Admission: RE | Admit: 2022-07-17 | Discharge: 2022-07-17 | Disposition: A | Discharge status: Home / Self Care | Payer: Medicare Other | Attending: Gastroenterology | Admitting: Gastroenterology

## 2022-07-17 DIAGNOSIS — K2100 Gastro-esophageal reflux disease with esophagitis, without bleeding: Secondary | ICD-10-CM | POA: Diagnosis not present

## 2022-07-17 DIAGNOSIS — K219 Gastro-esophageal reflux disease without esophagitis: Secondary | ICD-10-CM | POA: Diagnosis not present

## 2022-07-17 DIAGNOSIS — G4730 Sleep apnea, unspecified: Secondary | ICD-10-CM | POA: Insufficient documentation

## 2022-07-17 DIAGNOSIS — I10 Essential (primary) hypertension: Secondary | ICD-10-CM | POA: Diagnosis not present

## 2022-07-17 DIAGNOSIS — K449 Diaphragmatic hernia without obstruction or gangrene: Secondary | ICD-10-CM

## 2022-07-17 DIAGNOSIS — K222 Esophageal obstruction: Secondary | ICD-10-CM | POA: Diagnosis not present

## 2022-07-17 DIAGNOSIS — M1990 Unspecified osteoarthritis, unspecified site: Secondary | ICD-10-CM | POA: Diagnosis not present

## 2022-07-17 DIAGNOSIS — D751 Secondary polycythemia: Secondary | ICD-10-CM | POA: Insufficient documentation

## 2022-07-17 DIAGNOSIS — J45909 Unspecified asthma, uncomplicated: Secondary | ICD-10-CM | POA: Diagnosis not present

## 2022-07-17 DIAGNOSIS — R1310 Dysphagia, unspecified: Secondary | ICD-10-CM | POA: Insufficient documentation

## 2022-07-17 DIAGNOSIS — E039 Hypothyroidism, unspecified: Secondary | ICD-10-CM | POA: Insufficient documentation

## 2022-07-17 HISTORY — PX: ESOPHAGEAL DILATION: SHX303

## 2022-07-17 HISTORY — PX: ESOPHAGOGASTRODUODENOSCOPY (EGD) WITH PROPOFOL: SHX5813

## 2022-07-17 SURGERY — ESOPHAGOGASTRODUODENOSCOPY (EGD) WITH PROPOFOL
Anesthesia: General

## 2022-07-17 MED ORDER — PROPOFOL 500 MG/50ML IV EMUL
INTRAVENOUS | Status: DC | PRN
  Administered 2022-07-17: 100 ug/kg/min via INTRAVENOUS

## 2022-07-17 MED ORDER — PROPOFOL 10 MG/ML IV BOLUS
INTRAVENOUS | Status: DC | PRN
  Administered 2022-07-17: 100 mg via INTRAVENOUS
  Administered 2022-07-17: 50 mg via INTRAVENOUS

## 2022-07-17 MED ORDER — LACTATED RINGERS IV SOLN: INTRAVENOUS | Status: DC | PRN

## 2022-07-17 MED ORDER — LIDOCAINE HCL (CARDIAC) PF 100 MG/5ML IV SOSY
PREFILLED_SYRINGE | INTRAVENOUS | Status: DC | PRN
  Administered 2022-07-17: 50 mg via INTRAVENOUS

## 2022-07-17 NOTE — Op Note (Signed)
[facility] Patient Name: Thomas [name] Procedure Date: 07/17/2022 12:58 PM MRN: [id number] Date of Birth: 04-25-1934 Attending MD: Maylon Peppers , , [id number] CSN: [id number] Age: 88 Admit Type: Outpatient Procedure:                Upper GI endoscopy Indications:              Dysphagia, Odynophagia Providers:                Maylon Peppers, Crystal Page, Casimer Bilis, Technician Referring MD:              Medicines:                Monitored Anesthesia Care Complications:            No immediate complications. Estimated Blood Loss:     Estimated blood loss: none. Procedure:                Pre-Anesthesia Assessment:                           - Prior to the procedure, a History and Physical                            was performed, and patient medications, allergies                            and sensitivities were reviewed. The patient's                            tolerance of previous anesthesia was reviewed.                           - The risks and benefits of the procedure and the                            sedation options and risks were discussed with the                            patient. All questions were answered and informed                            consent was obtained.                           - ASA Grade Assessment: II - A patient with mild                            systemic disease.                           After obtaining informed consent, the endoscope was                            passed under direct vision. Throughout the  procedure, the patient's blood pressure, pulse, and                            oxygen saturations were monitored continuously. The                            [id number] scope was introduced through the                            mouth, and advanced to the second part of duodenum.                            The upper GI endoscopy was accomplished without                             difficulty. The patient tolerated the procedure                            well. Scope In: 1:21:20 PM Scope Out: 1:31:29 PM Total Procedure Duration: 0 hours 10 minutes 9 seconds  Findings:      A non-obstructing Schatzki ring was found at the gastroesophageal       junction. A cold forceps was used to disrupt the rim of the ring. A TTS       dilator was passed through the scope. Dilation with an 18-19-20 mm       balloon dilator was performed to 20 mm. The dilation site was examined       following endoscope reinsertion and showed mild mucosal disruption.      A 2 cm hiatal hernia was present.      The gastroesophageal flap valve was visualized endoscopically and       classified as Hill Grade III (minimal fold, loose to endoscope, hiatal       hernia likely).      The stomach was normal.      The examined duodenum was normal. Impression:               - Non-obstructing Schatzki ring. Dilated.                           - 2 cm hiatal hernia.                           - Normal stomach.                           - Normal examined duodenum.                           - No specimens collected. Moderate Sedation:      Per Anesthesia Care Recommendation:           - Discharge patient to home (ambulatory).                           - Resume previous diet.                           -  Continue present medications. Procedure Code(s):        --- Professional ---                           [phone], Esophagogastroduodenoscopy, flexible,                            transoral; with transendoscopic balloon dilation of                            esophagus (less than 30 mm diameter) Diagnosis Code(s):        --- Professional ---                           K22.2, Esophageal obstruction                           K44.9, Diaphragmatic hernia without obstruction or                            gangrene                           R13.10, Dysphagia, unspecified CPT copyright 2022 American Medical  Association. All rights reserved. The codes documented in this report are preliminary and upon coder review may  be revised to meet current compliance requirements. Maylon Peppers, MD Maylon Peppers,  07/17/2022 1:37:43 PM This report has been signed electronically. Number of Addenda: 0

## 2022-07-17 NOTE — Anesthesia Procedure Notes (Signed)
Date/Time: 07/17/2022 1:12 PM  Performed by: Orlie Dakin, CRNAPre-anesthesia Checklist: Patient identified, Emergency Drugs available, Suction available and Patient being monitored Patient Re-evaluated:Patient Re-evaluated prior to induction Oxygen Delivery Method: Nasal cannula Induction Type: IV induction Placement Confirmation: positive ETCO2

## 2022-07-17 NOTE — Discharge Instructions (Signed)
You are being discharged to home.  Resume your previous diet.  Continue your present medications.  

## 2022-07-17 NOTE — Interval H&P Note (Signed)
History and Physical Interval Note:  07/17/2022 11:44 AM  Thomas [name]  has presented today for surgery, with the diagnosis of Dysphagia, Odynophagia.  The various methods of treatment have been discussed with the patient and family. After consideration of risks, benefits and other options for treatment, the patient has consented to  Procedure(s) with comments: ESOPHAGOGASTRODUODENOSCOPY (EGD) WITH PROPOFOL (N/A) - 1:30pm; ASA 3 ESOPHAGEAL DILATION (N/A) as a surgical intervention.  The patient's history has been reviewed, patient examined, no change in status, stable for surgery.  I have reviewed the patient's chart and labs.  Questions were answered to the patient's satisfaction.     Maylon Peppers [place]

## 2022-07-17 NOTE — Transfer of Care (Signed)
Immediate Anesthesia Transfer of Care Note  Patient: Thomas [name]  Procedure(s) Performed: ESOPHAGOGASTRODUODENOSCOPY (EGD) WITH PROPOFOL ESOPHAGEAL DILATION  Patient Location: Short Stay  Anesthesia Type:General  Level of Consciousness: drowsy  Airway & Oxygen Therapy: Patient Spontanous Breathing and Patient connected to nasal cannula oxygen  Post-op Assessment: Report given to RN and Post -op Vital signs reviewed and stable  Post vital signs: Reviewed and stable  Last Vitals:  Vitals Value Taken Time  BP    Temp    Pulse    Resp    SpO2      Last Pain:  Vitals:   07/17/22 1311  TempSrc:   PainSc: 0-No pain         Complications: No notable events documented.

## 2022-07-17 NOTE — Anesthesia Preprocedure Evaluation (Addendum)
Anesthesia Evaluation   Patient awake    Airway Mallampati: II  TM Distance: >3 FB Neck ROM: Full    Dental no notable dental hx.    Pulmonary asthma    Pulmonary exam normal        Cardiovascular Exercise Tolerance: Good hypertension, Pt. on medications  Rhythm:Regular Rate:Normal     Neuro/Psych negative neurological ROS  negative psych ROS   GI/Hepatic Neg liver ROS, hiatal hernia,GERD  ,,  Endo/Other  Hypothyroidism    Renal/GU negative Renal ROS  negative genitourinary   Musculoskeletal  (+) Arthritis ,    Abdominal Normal abdominal exam  (+)   Peds negative pediatric ROS (+)  Hematology   Anesthesia Other Findings   Reproductive/Obstetrics negative OB ROS                             Anesthesia Physical Anesthesia Plan  ASA: 2  Anesthesia Plan: General   Post-op Pain Management:    Induction: Intravenous  PONV Risk Score and Plan:   Airway Management Planned: Nasal Cannula and Natural Airway  Additional Equipment:   Intra-op Plan:   Post-operative Plan:   Informed Consent: I have reviewed the patients History and Physical, chart, labs and discussed the procedure including the risks, benefits and alternatives for the proposed anesthesia with the patient or authorized representative who has indicated his/her understanding and acceptance.     Dental advisory given  Plan Discussed with: CRNA and Anesthesiologist  Anesthesia Plan Comments:        Anesthesia Quick Evaluation

## 2022-07-19 NOTE — Anesthesia Postprocedure Evaluation (Signed)
Anesthesia Post Note  Patient: Thomas [name]  Procedure(s) Performed: ESOPHAGOGASTRODUODENOSCOPY (EGD) WITH PROPOFOL ESOPHAGEAL DILATION  Patient location during evaluation: Phase II Anesthesia Type: General Level of consciousness: awake Pain management: pain level controlled Vital Signs Assessment: post-procedure vital signs reviewed and stable Respiratory status: spontaneous breathing and respiratory function stable Cardiovascular status: blood pressure returned to baseline and stable Postop Assessment: no headache and no apparent nausea or vomiting Anesthetic complications: no Comments: Late entry   No notable events documented.   Last Vitals:  Vitals:   07/17/22 1142 07/17/22 1336  BP: (!) 169/90 126/80  Pulse: (!) 59 64  Resp: 14 15  Temp: 37.2 C 36.9 C  SpO2: 100%     Last Pain:  Vitals:   07/18/22 0933  TempSrc:   PainSc: 0-No pain                 Louann Sjogren

## 2022-07-24 ENCOUNTER — Encounter (HOSPITAL_COMMUNITY): Payer: Self-pay | Admitting: Gastroenterology

## 2022-09-05 ENCOUNTER — Encounter: Payer: Self-pay | Admitting: Family Medicine

## 2022-09-05 ENCOUNTER — Other Ambulatory Visit: Payer: Self-pay | Admitting: Family Medicine

## 2022-09-05 ENCOUNTER — Ambulatory Visit (INDEPENDENT_AMBULATORY_CARE_PROVIDER_SITE_OTHER): Payer: Medicare Other | Admitting: Family Medicine

## 2022-09-05 VITALS — BP 124/68 | HR 76 | Temp 98.4°F | Ht 67.0 in | Wt 164.0 lb

## 2022-09-05 DIAGNOSIS — L237 Allergic contact dermatitis due to plants, except food: Secondary | ICD-10-CM

## 2022-09-05 MED ORDER — TRIAMCINOLONE ACETONIDE 0.1 % EX CREA: 1.0000 | TOPICAL_CREAM | Freq: Two times a day (BID) | CUTANEOUS | 2 refills | Status: DC

## 2022-09-05 MED ORDER — PREDNISONE 20 MG PO TABS: ORAL_TABLET | ORAL | 0 refills | Status: DC

## 2022-09-05 NOTE — Progress Notes (Unsigned)
   Subjective:    Patient ID: Thomas [name], male    DOB: 06-27-1934, 88 y.o.   MRN: [id number]  HPI Pt c/o rash to trunk and under arms x 2 to 3 weeks.  Itchy rash on the chest with red bumps papular rash causes some problems for the patient.  Denies any other particular troubles or issues.  Overall overall his health is good denies any fever chills sweats denies getting into anything.  Does work outside a lot  Review of Systems     Objective:   Physical Exam Lungs clear heart regular pulse normal erythematous rash on the chest no sign of infection does appear to be more of a contact like dermatitis       Assessment & Plan:  Contact dermatitis Steroid cream twice daily as needed over the course the next 7 to 10 days if not dramatically better please let us know Short prednisone taper Dermatology consult if ongoing troubles Follow-up for regular checkups

## 2022-09-06 ENCOUNTER — Ambulatory Visit: Payer: Medicare Other | Admitting: Nurse Practitioner

## 2022-09-27 ENCOUNTER — Encounter (INDEPENDENT_AMBULATORY_CARE_PROVIDER_SITE_OTHER): Payer: Self-pay | Admitting: Gastroenterology

## 2022-09-27 ENCOUNTER — Ambulatory Visit (INDEPENDENT_AMBULATORY_CARE_PROVIDER_SITE_OTHER): Payer: Medicare Other | Admitting: Gastroenterology

## 2022-09-27 VITALS — BP 143/81 | HR 59 | Temp 97.5°F | Ht 68.0 in | Wt 160.7 lb

## 2022-09-27 DIAGNOSIS — R1319 Other dysphagia: Secondary | ICD-10-CM | POA: Diagnosis not present

## 2022-09-27 DIAGNOSIS — K219 Gastro-esophageal reflux disease without esophagitis: Secondary | ICD-10-CM

## 2022-09-27 NOTE — Progress Notes (Addendum)
Referring Provider: Babs Sciara, MD Primary Care Physician:  Babs Sciara, MD Primary GI Physician: Levon Hedger   Chief Complaint  Patient presents with   Follow-up    Patient here today for a follow up on his Genella Rife. Patient says Genella Rife symptoms are well controlled on Pantoprazole 40 mg once per day.He will need a 90 day supply of those sent to [facility] pharmacy.   HPI:   Thomas [name] is a 88 y.o. male with past medical history of arthritis, GERD, hiatal hernia, HTN, hypothyroidism, polycythemia, sleep apnea (non CPAP)   Patient presenting today for follow up of GERD  Last seen march 2024, at that time, had some issues with raw feeling in his esophagus, an episode of food impaction that took 4 hours to pass, dysphagia almost daily. Some burning in his chest.   Recommended protonix 40mg  daily, EGD, carafate 1g QID  Present:  Patient doing well today. States dysphagia resolved after EGD. Doing well on protonix 40mg  daily. No breakthrough symptoms.  He has no GI complaints today. No red flag symptoms. Patient denies melena, hematochezia, nausea, vomiting, diarrhea, constipation, dysphagia, odyonophagia, early satiety or weight loss.   Last Colonoscopy: 2017 Diverticulosis in the sigmoid colon.                           - External hemorrhoids.                          - No specimens collected. Last Endoscopy:07/2022 - Non-obstructing Schatzki ring. Dilated.                           - 2 cm hiatal hernia.                           - Normal stomach.                           - Normal examined duodenum.                           - No specimens collected.  Past Medical History:  Diagnosis Date   Arthritis    "my entire back is gone"   Asthma    GERD (gastroesophageal reflux disease)    H/O hiatal hernia    Hernia    History of stress test 2011   Hypertension    Hypothyroidism    Kidney stones    Pollen allergies    Polycythemia    Polycythemia, secondary 09/11/2013   Renal  calculus    some removed by Cystoscopy, one passed spontaneously   Sleep apnea    mild no cpap    Past Surgical History:  Procedure Laterality Date   APPENDECTOMY     50 years ago   BACK SURGERY     L-4 , L-5  patient states that it was years ago.   COLONOSCOPY     at age 89   COLONOSCOPY N/A 04/06/2016   Procedure: COLONOSCOPY;  Surgeon: Malissa Hippo, MD;  [facility];  Service: Endoscopy;  Laterality: N/A;  7:30   ESOPHAGEAL DILATION N/A 07/17/2022   Procedure: ESOPHAGEAL DILATION;  Surgeon: Dolores Frame, MD;  Location: [facility];  Service: Gastroenterology;  Laterality: N/A;  ESOPHAGOGASTRODUODENOSCOPY (EGD) WITH PROPOFOL N/A 07/17/2022   Procedure: ESOPHAGOGASTRODUODENOSCOPY (EGD) WITH PROPOFOL;  Surgeon: Dolores Frame, MD;  Location[facility];  Service: Gastroenterology;  Laterality: N/A;  1:30pm; ASA 3   EYE SURGERY     cataracts removed- bilateral- /w IOL   INGUINAL HERNIA REPAIR Left 05/14/2016   Procedure: OPEN REPAIR OF SYMPTOMATIC (PAINFUL) LEFT INGUINAL HERNIA WITH MESH;  Surgeon: Ancil Linsey, MD;  Location: [facility];  Service: General;  Laterality: Left;   SINOSCOPY     THYROIDECTOMY Right 09/17/2012   Procedure: RIGHT HEMI-THYROIDECTOMY;  Surgeon: Darletta Moll, MD;  Location: [facility] OR;  Service: ENT;  Laterality: Right;   UPPER GASTROINTESTINAL ENDOSCOPY     egd/ed   YAG LASER APPLICATION Left 02/07/2016   Procedure: YAG LASER APPLICATION;  Surgeon: Jethro Bolus, MD;  Location: [facility];  Service: Ophthalmology;  Laterality: Left;    Current Outpatient Medications  Medication Sig Dispense Refill   albuterol (PROVENTIL HFA;VENTOLIN HFA) 108 (90 Base) MCG/ACT inhaler Inhale 2 puffs into the lungs every 4 (four) hours as needed. 1 Inhaler 5   amLODipine (NORVASC) 2.5 MG tablet Take 1 tablet (2.5 mg total) by mouth daily. 90 tablet 1   budesonide-formoterol (SYMBICORT) 80-4.5 MCG/ACT inhaler Inhale 1 puff into the lungs 2 (two)  times daily.     fluticasone (FLONASE) 50 MCG/ACT nasal spray USE 1 SPRAY IN EACH NOSTRIL TWICE DAILY. 16 g 0   Hypromellose (ARTIFICIAL TEARS OP) Place 1 drop into both eyes daily as needed (dry eyes).     ibuprofen (ADVIL,MOTRIN) 200 MG tablet Take 400 mg by mouth 2 (two) times daily as needed for headache or moderate pain.     ketoconazole (NIZORAL) 2 % cream APPLY TO AFFECTED AREAS TWICE DAILY AS NEEDED FOR IRRITATION. 60 g 0   levothyroxine (SYNTHROID) 100 MCG tablet TAKE ONE TABLET BY MOUTH DAILY EXCEPT ON SUNDAY AND THURSDAY TAKE 1/2 TABLET. TAKE BEFORE BREAKFAST. 78 tablet 1   mometasone (ELOCON) 0.1 % cream APPLY TO AFFECTED AREAS TWICE DAILY AS NEEDED. 45 g 0   pantoprazole (PROTONIX) 40 MG tablet Take 1 tablet (40 mg total) by mouth daily. 60 tablet 2   pravastatin (PRAVACHOL) 40 MG tablet Take 1 tablet (40 mg total) by mouth at bedtime. 90 tablet 1   triamcinolone cream (KENALOG) 0.1 % Apply 1 Application topically 2 (two) times daily. 45 g 2   valsartan (DIOVAN) 80 MG tablet 1 qd (Patient taking differently: Take 80 mg by mouth daily.) 90 tablet 1   No current facility-administered medications for this visit.    Allergies as of 09/27/2022 - Review Complete 09/27/2022  Allergen Reaction Noted   Penicillins Other (See Comments) 12/09/2009    Family History  Problem Relation Age of Onset   Heart disease Mother    Heart disease Father    Heart disease Brother    Healthy Son    Allergic rhinitis Neg Hx    Angioedema Neg Hx    Asthma Neg Hx    Atopy Neg Hx    Eczema Neg Hx    Immunodeficiency Neg Hx    Urticaria Neg Hx     Social History   Socioeconomic History   Marital status: Widowed    Spouse name: Not on file   Number of children: Not on file   Years of education: Not on file   Highest education level: Not on file  Occupational History   Not on file  Tobacco Use  Smoking status: Never    Passive exposure: Never   Smokeless tobacco: Former    Types: Chew    Tobacco comments:    Patient states that he has been chewing for 60 plus years  Vaping Use   Vaping Use: Never used  Substance and Sexual Activity   Alcohol use: No    Comment: Patient states that he drinks a bout a case of beer a year   Drug use: No   Sexual activity: Never  Other Topics Concern   Not on file  Social History Narrative   Not on file   Social Determinants of Health   Financial Resource Strain: Low Risk  (02/01/2022)   Overall Financial Resource Strain (CARDIA)    Difficulty of Paying Living Expenses: Not hard at all  Food Insecurity: No Food Insecurity (02/01/2022)   Hunger Vital Sign    Worried About Running Out of Food in the Last Year: Never true    Ran Out of Food in the Last Year: Never true  Transportation Needs: No Transportation Needs (02/01/2022)   PRAPARE - Administrator, Civil Service (Medical): No    Lack of Transportation (Non-Medical): No  Physical Activity: Sufficiently Active (02/01/2022)   Exercise Vital Sign    Days of Exercise per Week: 5 days    Minutes of Exercise per Session: 50 min  Stress: No Stress Concern Present (02/01/2022)   [employer] of Occupational Health - Occupational Stress Questionnaire    Feeling of Stress : Not at all  Social Connections: Socially Isolated (02/01/2022)   Social Connection and Isolation Panel [NHANES]    Frequency of Communication with Friends and Family: More than three times a week    Frequency of Social Gatherings with Friends and Family: Once a week    Attends Religious Services: Never    Database administrator or Organizations: No    Attends Banker Meetings: Never    Marital Status: Widowed    Review of systems General: negative for malaise, night sweats, fever, chills, weight loss Neck: Negative for lumps, goiter, pain and significant neck swelling Resp: Negative for cough, wheezing, dyspnea at rest CV: Negative for chest pain, leg swelling, palpitations,  orthopnea GI: denies melena, hematochezia, nausea, vomiting, diarrhea, constipation, dysphagia, odyonophagia, early satiety or unintentional weight loss.  MSK: Negative for joint pain or swelling, back pain, and muscle pain. Derm: Negative for itching or rash Psych: Denies depression, anxiety, memory loss, confusion. No homicidal or suicidal ideation.  Heme: Negative for prolonged bleeding, bruising easily, and swollen nodes. Endocrine: Negative for cold or heat intolerance, polyuria, polydipsia and goiter. Neuro: negative for tremor, gait imbalance, syncope and seizures. The remainder of the review of systems is noncontributory.  Physical Exam: BP (!) 143/81 (BP Location: Left Arm, Patient Position: Sitting, Cuff Size: Normal)   Pulse (!) 59   Temp (!) 97.5 F (36.4 C) (Temporal)   Ht 5\' 8"  (1.727 m)   Wt 160 lb 11.2 oz (72.9 kg)   BMI 24.43 kg/m  General:   Alert and oriented. No distress noted. Pleasant and cooperative.  Head:  Normocephalic and atraumatic. Eyes:  Conjuctiva clear without scleral icterus. Mouth:  Oral mucosa pink and moist. Good dentition. No lesions. Heart: Normal rate and rhythm, s1 and s2 heart sounds present.  Lungs: Clear lung sounds in all lobes. Respirations equal and unlabored. Abdomen:  +BS, soft, non-tender and non-distended. No rebound or guarding. No HSM or masses noted. Derm: No palmar  erythema or jaundice Msk:  Symmetrical without gross deformities. Normal posture. Extremities:  Without edema. Neurologic:  Alert and  oriented x4 Psych:  Alert and cooperative. Normal mood and affect.  Invalid input(s): "6 MONTHS"   ASSESSMENT: Thomas [name] is a 88 y.o. male presenting today for follow up of Dysphagia and GERD.  Dysphagia has resolved since EGD with dilation of non obstructing Schatzki ring. GERD is well managed on protonix 40mg  daily. He denies any breakthrough symptoms. No red flag symptoms. Patient denies melena, hematochezia, nausea,  vomiting, diarrhea, constipation, dysphagia, odyonophagia, early satiety or weight loss. Will continue with protonix 40mg  daily, good reflux precautions.    PLAN:  Continue protonix 40mg  daily (can do 90 day supply on refill at next due)  2. Good reflux precautions    All questions were answered, patient verbalized understanding and is in agreement with plan as outlined above.    Follow Up: 1 year   Raymar Joiner L. Jeanmarie Hubert, MSN, APRN, AGNP-C Adult-Gerontology Nurse Practitioner [facility]  I have reviewed the note and agree with the APP's assessment as described in this progress note  Katrinka Blazing, MD Gastroenterology and Hepatology [facility]

## 2022-09-27 NOTE — Patient Instructions (Addendum)
Continue with protonix 40mg  daily Make sure you are following Good reflux precautions to include being mindful of greasy, spicy, fried, citrus foods, caffeine, carbonated drinks, chocolate and alcohol can increase reflux symptoms, Stay upright 2-3 hours after eating, prior to lying down and avoid eating late in the evenings.  We will plan to see you back in 1 year, if you have any new or worsening GI symptoms before that, please don't hesitate to reach out to [place]  It was a pleasure to see you today. I want to create trusting relationships with patients and provide genuine, compassionate, and quality care. I truly value your feedback! please be on the lookout for a survey regarding your visit with me today. I appreciate your input about our visit and your time in completing this!    Haedyn Breau L. Jeanmarie Hubert, MSN, APRN, AGNP-C Adult-Gerontology Nurse Practitioner [facility]astroenterology at [facility]

## 2022-10-11 ENCOUNTER — Other Ambulatory Visit (INDEPENDENT_AMBULATORY_CARE_PROVIDER_SITE_OTHER): Payer: Self-pay | Admitting: Gastroenterology

## 2022-11-06 ENCOUNTER — Other Ambulatory Visit: Payer: Self-pay | Admitting: Family Medicine

## 2022-11-06 ENCOUNTER — Other Ambulatory Visit: Payer: Self-pay | Admitting: Allergy & Immunology

## 2022-11-12 ENCOUNTER — Ambulatory Visit (INDEPENDENT_AMBULATORY_CARE_PROVIDER_SITE_OTHER): Payer: Medicare Other | Admitting: Family Medicine

## 2022-11-12 ENCOUNTER — Ambulatory Visit
Admission: RE | Admit: 2022-11-12 | Discharge: 2022-11-12 | Disposition: A | Discharge status: Home / Self Care | Payer: Medicare Other | Source: Ambulatory Visit | Attending: Family Medicine | Admitting: Family Medicine

## 2022-11-12 ENCOUNTER — Ambulatory Visit: Payer: Medicare Other

## 2022-11-12 VITALS — BP 149/74 | HR 64 | Temp 98.2°F | Ht 68.0 in | Wt 161.6 lb

## 2022-11-12 DIAGNOSIS — M25551 Pain in right hip: Secondary | ICD-10-CM | POA: Insufficient documentation

## 2022-11-12 DIAGNOSIS — M79651 Pain in right thigh: Secondary | ICD-10-CM | POA: Diagnosis not present

## 2022-11-12 DIAGNOSIS — I10 Essential (primary) hypertension: Secondary | ICD-10-CM | POA: Diagnosis not present

## 2022-11-12 DIAGNOSIS — M898X5 Other specified disorders of bone, thigh: Secondary | ICD-10-CM | POA: Diagnosis not present

## 2022-11-12 DIAGNOSIS — M2550 Pain in unspecified joint: Secondary | ICD-10-CM

## 2022-11-12 DIAGNOSIS — M1611 Unilateral primary osteoarthritis, right hip: Secondary | ICD-10-CM | POA: Diagnosis not present

## 2022-11-12 NOTE — Progress Notes (Signed)
   Subjective:    Patient ID: Thomas [name], male    DOB: 28-Jan-1935, 88 y.o.   MRN: [id number]  HPI Pt comes in today for pain in his right hip and leg. Pt also complains of joint pain mostly over the body. Started in right heel then in femur area  Thomas [name] relates that he is having hip pain discomfort in his right hip into the femur hurts with standing up hurts with movement and walking He also relates a lot of pain with muscle pains in both legs as well as joint pains in the legs shoulders and to some degree in his hands Progressive over the course of the past several weeks.  Keeps him awake to some degree.  Pain is in the anterior portion of the right hip Review of Systems     Objective:   Physical Exam General-in no acute distress Eyes-no discharge Lungs-respiratory rate normal, CTA CV-no murmurs,RRR Extremities skin warm dry no edema Neuro grossly normal Behavior normal, alert Has findings of osteoarthritis in the hands.  No swelling of the joints.  Subjective discomfort in the right hip.       Assessment & Plan:   1. Right hip pain Recommend x-rays Gentle range of motion - XR HIP UNILAT W OR W/O PELVIS 2-3 VIEWS RIGHT  2. Pain in right femur X-rays ordered await results - XR HIP UNILAT W OR W/O PELVIS 2-3 VIEWS RIGHT - XR FEMUR PORT, MIN 2 VIEWS RIGHT - DG FEMUR, MIN 2 VIEWS RIGHT  3. Arthralgia, unspecified joint Lab work ordered.  Need to rule out the possibility of polymyalgia rheumatica - Rheumatoid factor - C-reactive protein - Sedimentation Rate - CK - Lactate Dehydrogenase  4. HTN (hypertension), benign Blood pressure under decent control - Microalbumin/Creatinine Ratio, Urine

## 2022-11-13 DIAGNOSIS — E7849 Other hyperlipidemia: Secondary | ICD-10-CM | POA: Diagnosis not present

## 2022-11-13 DIAGNOSIS — Z79899 Other long term (current) drug therapy: Secondary | ICD-10-CM | POA: Diagnosis not present

## 2022-11-13 DIAGNOSIS — E038 Other specified hypothyroidism: Secondary | ICD-10-CM | POA: Diagnosis not present

## 2022-11-13 DIAGNOSIS — D751 Secondary polycythemia: Secondary | ICD-10-CM | POA: Diagnosis not present

## 2022-11-13 DIAGNOSIS — M2550 Pain in unspecified joint: Secondary | ICD-10-CM | POA: Diagnosis not present

## 2022-11-13 DIAGNOSIS — I10 Essential (primary) hypertension: Secondary | ICD-10-CM | POA: Diagnosis not present

## 2022-11-16 ENCOUNTER — Other Ambulatory Visit: Payer: Self-pay | Admitting: Family Medicine

## 2022-11-16 ENCOUNTER — Telehealth: Payer: Self-pay | Admitting: Family Medicine

## 2022-11-16 MED ORDER — PREDNISONE 20 MG PO TABS: 20.0000 mg | ORAL_TABLET | Freq: Every day | ORAL | 0 refills | Status: DC

## 2022-11-16 NOTE — Telephone Encounter (Signed)
Patient's lab work points toward polymyalgia rheumatica he has pain and stiffness in his legs and shoulders arms we will start prednisone 20 mg once a day I will also touch base with rheumatology regarding his situation In addition to this we will touch base with him next week to see if that prednisone is helping Medication sent in, [place]

## 2022-11-18 ENCOUNTER — Encounter: Payer: Self-pay | Admitting: Family Medicine

## 2022-11-18 NOTE — Progress Notes (Signed)
Plz mail 

## 2022-11-21 ENCOUNTER — Encounter: Payer: Self-pay | Admitting: Internal Medicine

## 2022-11-21 ENCOUNTER — Ambulatory Visit: Payer: Medicare Other | Admitting: Internal Medicine

## 2022-11-21 ENCOUNTER — Other Ambulatory Visit: Payer: Self-pay

## 2022-11-21 VITALS — BP 136/76 | HR 76 | Temp 98.0°F | Resp 14 | Ht 67.32 in | Wt 162.5 lb

## 2022-11-21 DIAGNOSIS — J4540 Moderate persistent asthma, uncomplicated: Secondary | ICD-10-CM | POA: Diagnosis not present

## 2022-11-21 DIAGNOSIS — J302 Other seasonal allergic rhinitis: Secondary | ICD-10-CM

## 2022-11-21 DIAGNOSIS — J3089 Other allergic rhinitis: Secondary | ICD-10-CM | POA: Diagnosis not present

## 2022-11-21 MED ORDER — BUDESONIDE-FORMOTEROL FUMARATE 80-4.5 MCG/ACT IN AERO: 2.0000 | INHALATION_SPRAY | Freq: Two times a day (BID) | RESPIRATORY_TRACT | 5 refills | Status: DC

## 2022-11-21 MED ORDER — ALBUTEROL SULFATE HFA 108 (90 BASE) MCG/ACT IN AERS: 2.0000 | INHALATION_SPRAY | RESPIRATORY_TRACT | 1 refills | Status: DC | PRN

## 2022-11-21 NOTE — Progress Notes (Signed)
FOLLOW UP Date of Service/Encounter:  11/21/22   Subjective:  Thomas [name] (DOB: 05-28-1934) is a 88 y.o. male who [facility] for follow up for moderate persistent asthma and allergic rhinitis.   History obtained from: chart review and patient. Last visit was with Dr. Dellis Anes on 10/25/2021 and at the time was doing well on Symbicort and being off allergy shots since 2021.   Asthma: Reports doing well since last visit.  Rarely needs his albuterol and this is only with significant activity in the heat.  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  Reports compliance with Symbicort 80 mcg 1 puffs twice a day.  Since last visit no ER visits or oral prednisone use.  He has gotten oral prednisone but that is for his arthritis.  Rhinitis: Doing well overall.  Rarely has sneezing and drainage. He does have Clarinex to use as needed which helps.  Past Medical History: Past Medical History:  Diagnosis Date   Arthritis    "my entire back is gone"   Asthma    GERD (gastroesophageal reflux disease)    H/O hiatal hernia    Hernia    History of stress test 2011   Hypertension    Hypothyroidism    Kidney stones    Pollen allergies    Polycythemia    Polycythemia, secondary 09/11/2013   Renal calculus    some removed by Cystoscopy, one passed spontaneously   Sleep apnea    mild no cpap    Objective:  BP 136/76   Pulse 76   Temp 98 F (36.7 C)   Resp 14   Ht 5' 7.32" (1.71 m)   Wt 162 lb 8 oz (73.7 kg)   SpO2 97%   BMI 25.21 kg/m  Body mass index is 25.21 kg/m. Physical Exam: GEN: alert, well developed HEENT: clear conjunctiva, TM grey and translucent, nose with mild inferior turbinate hypertrophy, pink nasal mucosa, no rhinorrhea, no cobblestoning HEART: regular rate and rhythm, no murmur LUNGS: clear to auscultation bilaterally, no coughing, unlabored respiration SKIN: no rashes or lesions  Spirometry:  Tracings  reviewed. His effort: Good reproducible efforts. FVC: 2.81L FEV1: 1.86L, 76% predicted FEV1/FVC ratio: 66% Interpretation: Spirometry consistent with normal pattern.  Please see scanned spirometry results for details.   Assessment:   1. Moderate persistent asthma, uncomplicated   2. Seasonal and perennial allergic rhinitis     Plan/Recommendations:  1. Moderate persistent asthma, uncomplicated - Well controlled.  Spirometry with ratio below 70% but normal for his age.  - Daily controller medication(s): Symbicort 80/4.80mcg one puff twice daily with spacer - Prior to physical activity: albuterol 2 puffs 10-15 minutes before physical activity. - Rescue medications: Albuterol 1-2 puffs every 4-6 hours as needed for wheezing or shortness of breath - Changes during respiratory infections or worsening symptoms: Increase Symbicort 80/4.33mcg to 2 puffs twice daily for TWO WEEKS. - Asthma control goals:  * Full participation in all desired activities (may need albuterol before activity) * Albuterol use two time or less a week on average (not counting use with activity) * Cough interfering with sleep two time or less a month * Oral steroids no more than once a year * No hospitalizations  2. Seasonal and perennial allergic rhinitis  - Controlled - Previously completed AIT in 2021.  - Continue with Clarinex 5mg  once daily as needed for runny nose, sneezing, itchy watery eyes.  Return in about 6 months (around 05/24/2023).  Alesia Morin, MD [facility]

## 2022-11-21 NOTE — Patient Instructions (Addendum)
1. Moderate persistent asthma, uncomplicated - Daily controller medication(s): Symbicort 80/4.80mcg one puff twice daily with spacer - Prior to physical activity: albuterol 2 puffs 10-15 minutes before physical activity. - Rescue medications: Albuterol 1-2 puffs every 4-6 hours as needed for wheezing or shortness of breath - Changes during respiratory infections or worsening symptoms: Increase Symbicort 80/4.58mcg to 2 puffs twice daily for TWO WEEKS. - Asthma control goals:  * Full participation in all desired activities (may need albuterol before activity) * Albuterol use two time or less a week on average (not counting use with activity) * Cough interfering with sleep two time or less a month * Oral steroids no more than once a year * No hospitalizations  2. Seasonal and perennial allergic rhinitis  - Continue with Clarinex 5mg  once daily as needed for runny nose, sneezing, itchy watery eyes.

## 2022-11-30 NOTE — Telephone Encounter (Signed)
Tried calling the patient his mailbox was full he has a follow-up visit in a few days

## 2022-12-04 ENCOUNTER — Ambulatory Visit (INDEPENDENT_AMBULATORY_CARE_PROVIDER_SITE_OTHER): Payer: Medicare Other | Admitting: Family Medicine

## 2022-12-04 DIAGNOSIS — E038 Other specified hypothyroidism: Secondary | ICD-10-CM | POA: Diagnosis not present

## 2022-12-04 DIAGNOSIS — M353 Polymyalgia rheumatica: Secondary | ICD-10-CM | POA: Diagnosis not present

## 2022-12-04 DIAGNOSIS — I10 Essential (primary) hypertension: Secondary | ICD-10-CM | POA: Diagnosis not present

## 2022-12-04 MED ORDER — LEVOTHYROXINE SODIUM 100 MCG PO TABS: ORAL_TABLET | ORAL | 1 refills | Status: DC

## 2022-12-04 MED ORDER — PRAVASTATIN SODIUM 40 MG PO TABS: 40.0000 mg | ORAL_TABLET | Freq: Every day | ORAL | 1 refills | Status: DC

## 2022-12-04 MED ORDER — PREDNISONE 5 MG PO TABS: ORAL_TABLET | ORAL | 0 refills | Status: DC

## 2022-12-04 MED ORDER — PREDNISONE 20 MG PO TABS: 20.0000 mg | ORAL_TABLET | Freq: Every day | ORAL | 0 refills | Status: DC

## 2022-12-04 MED ORDER — VALSARTAN 80 MG PO TABS: 80.0000 mg | ORAL_TABLET | Freq: Every day | ORAL | 1 refills | Status: DC

## 2022-12-04 MED ORDER — AMLODIPINE BESYLATE 2.5 MG PO TABS: 2.5000 mg | ORAL_TABLET | Freq: Every day | ORAL | 1 refills | Status: DC

## 2022-12-04 NOTE — Progress Notes (Signed)
   Subjective:    Patient ID: Thomas [name], male    DOB: Aug 22, 1934, 88 y.o.   MRN: [id number]  HPI Virtual Visit via Telephone Note  I connected with Thomas [name] on 12/04/22 at  9:20 AM EDT by telephone and verified that I am speaking with the correct person using two identifiers. Patient does not have the ability to link by video. Phone consultation was completed with patient today Location: Patient: home Provider: home   I discussed the limitations, risks, security and privacy concerns of performing an evaluation and management service by telephone and the availability of in person appointments. I also discussed with the patient that there may be a patient responsible charge related to this service. The patient expressed understanding and agreed to proceed.   History of Present Illness:    Observations/Objective:   Assessment and Plan:   Follow Up Instructions:    I discussed the assessment and treatment plan with the patient. The patient was provided an opportunity to ask questions and all were answered. The patient agreed with the plan and demonstrated an understanding of the instructions.   The patient was advised to call back or seek an in-person evaluation if the symptoms worsen or if the condition fails to improve as anticipated.  I provided 15 minutes of non-face-to-face time during this encounter.  Patient states he is much improved on the prednisone but once he stopped that he started having severe achiness stiffness and discomfort.  His C-reactive protein was elevated x-rays were negative in regards to severity of condition symptoms and lab work points toward polymyalgia rheumatica   Review of Systems     Objective:   Physical Exam Today's visit was via telephone Physical exam was not possible for this visit        Assessment & Plan:  Telephone discussion Will do follow-up visit with him middle of September Polymyalgia rheumatica Lab work  reviewed Prednisone 20 mg 1 daily for 2 weeks then reduce it to prednisone 5 mg 3-1/2 tablet daily for 3 weeks then we will gradually reduce as he tolerates

## 2022-12-07 DIAGNOSIS — M25562 Pain in left knee: Secondary | ICD-10-CM | POA: Diagnosis not present

## 2022-12-09 ENCOUNTER — Telehealth: Payer: Self-pay | Admitting: Family Medicine

## 2022-12-09 NOTE — Telephone Encounter (Signed)
Nurses Please connect with patient I like for him to do blood work before his follow-up visit late in September Please order CBC, metabolic 7, sed rate, CRP, anti-CCP, serum protein electrophoresis Diagnosis polymyalgia rheumatica, arthralgias (Below are some notes from Dr.Deveshwar for future reference)    We will plan to taper him gradually eveshwar, Janalyn Rouse, MD  Babs Sciara, MD Hi Dr. Gerda Diss, I reviewed the note and also reviewed patient's chart.  His sed rate was normal although C-reactive protein was elevated.  It is unusual for patients with polymyalgia rheumatica did not respond to prednisone.  Usually patients respond very well to 20 mg of prednisone.  I am concerned that he may have some other reason for his symptoms. You may want to discontinue prednisone as patient had no response to prednisone in 2 weeks.  You can taper it off of over the next week. You may consider obtaining anti-CCP evaluate for rheumatoid arthritis may be SPEP and may be total body bone scan.  If he develops increased symptoms.  I usually see elevated sedimentation rate with PMR. Please let me know if I can be of any further help. Janalyn Rouse

## 2022-12-12 ENCOUNTER — Other Ambulatory Visit: Payer: Self-pay

## 2022-12-18 ENCOUNTER — Other Ambulatory Visit: Payer: Self-pay

## 2022-12-18 DIAGNOSIS — M353 Polymyalgia rheumatica: Secondary | ICD-10-CM

## 2022-12-18 DIAGNOSIS — M2550 Pain in unspecified joint: Secondary | ICD-10-CM

## 2022-12-18 NOTE — Telephone Encounter (Signed)
Spoke with the patient regarding previous message regarding arthralgia information and lab orders. Patient does say that he is responding to prednisone and it is helping him to function daily.

## 2022-12-27 ENCOUNTER — Telehealth: Payer: Self-pay

## 2022-12-27 DIAGNOSIS — M353 Polymyalgia rheumatica: Secondary | ICD-10-CM | POA: Diagnosis not present

## 2022-12-27 DIAGNOSIS — M2550 Pain in unspecified joint: Secondary | ICD-10-CM | POA: Diagnosis not present

## 2022-12-27 NOTE — Telephone Encounter (Signed)
Pt would like to go to Prednisone 12.5 mg if Dr Lorin Picket agrees, Jafar has appt on 01/10/2023. Doing fine on the 15mg  pt did blood work on 12/27/2022  Georga Bora [phone]

## 2022-12-28 ENCOUNTER — Other Ambulatory Visit: Payer: Self-pay | Admitting: Family Medicine

## 2022-12-28 MED ORDER — PREDNISONE 5 MG PO TABS: ORAL_TABLET | ORAL | 0 refills | Status: DC

## 2022-12-28 NOTE — Telephone Encounter (Signed)
Patient notified and verbalized understanding. 

## 2022-12-28 NOTE — Telephone Encounter (Signed)
Pred reduced 5 mg pred New directions 2 and 1/2 every day for 2 weeks Then 2 every day for 2 weeks Keep ov later this month Rx sent to [facility]

## 2023-01-02 LAB — PROTEIN ELECTROPHORESIS, SERUM
A/G Ratio: 1.6 (ref 0.7–1.7)
Albumin ELP: 3.7 g/dL (ref 2.9–4.4)
Alpha 1: 0.2 g/dL (ref 0.0–0.4)
Alpha 2: 0.5 g/dL (ref 0.4–1.0)
Beta: 0.9 g/dL (ref 0.7–1.3)
Gamma Globulin: 0.7 g/dL (ref 0.4–1.8)
Globulin, Total: 2.3 g/dL (ref 2.2–3.9)
Total Protein: 6 g/dL (ref 6.0–8.5)

## 2023-01-02 LAB — CBC WITH DIFFERENTIAL/PLATELET
Basophils Absolute: 0 10*3/uL (ref 0.0–0.2)
Basos: 0 %
EOS (ABSOLUTE): 0.1 10*3/uL (ref 0.0–0.4)
Eos: 1 %
Hematocrit: 49.1 % (ref 37.5–51.0)
Hemoglobin: 16.3 g/dL (ref 13.0–17.7)
Immature Grans (Abs): 0 10*3/uL (ref 0.0–0.1)
Immature Granulocytes: 0 %
Lymphocytes Absolute: 1.7 10*3/uL (ref 0.7–3.1)
Lymphs: 25 %
MCH: 31.8 pg (ref 26.6–33.0)
MCHC: 33.2 g/dL (ref 31.5–35.7)
MCV: 96 fL (ref 79–97)
Monocytes Absolute: 0.4 10*3/uL (ref 0.1–0.9)
Monocytes: 7 %
Neutrophils Absolute: 4.5 10*3/uL (ref 1.4–7.0)
Neutrophils: 67 %
Platelets: 221 10*3/uL (ref 150–450)
RBC: 5.12 x10E6/uL (ref 4.14–5.80)
RDW: 12.6 % (ref 11.6–15.4)
WBC: 6.8 10*3/uL (ref 3.4–10.8)

## 2023-01-02 LAB — BASIC METABOLIC PANEL
BUN/Creatinine Ratio: 16 (ref 10–24)
BUN: 16 mg/dL (ref 8–27)
CO2: 27 mmol/L (ref 20–29)
Calcium: 9.3 mg/dL (ref 8.6–10.2)
Chloride: 102 mmol/L (ref 96–106)
Creatinine, Ser: 0.98 mg/dL (ref 0.76–1.27)
Glucose: 88 mg/dL (ref 70–99)
Potassium: 4.4 mmol/L (ref 3.5–5.2)
Sodium: 142 mmol/L (ref 134–144)
eGFR: 75 mL/min/{1.73_m2} (ref >59)

## 2023-01-02 LAB — C-REACTIVE PROTEIN: CRP: <1 mg/L (ref 0–10)

## 2023-01-02 LAB — ANTI-CCP AB, IGG + IGA (RDL): Anti-CCP Ab, IgG + IgA (RDL): <20 U (ref <20)

## 2023-01-02 LAB — SEDIMENTATION RATE: Sed Rate: 2 mm/h (ref 0–30)

## 2023-01-03 ENCOUNTER — Other Ambulatory Visit: Payer: Self-pay | Admitting: Family Medicine

## 2023-01-10 ENCOUNTER — Encounter: Payer: Self-pay | Admitting: Family Medicine

## 2023-01-10 ENCOUNTER — Ambulatory Visit (INDEPENDENT_AMBULATORY_CARE_PROVIDER_SITE_OTHER): Payer: Medicare Other | Admitting: Family Medicine

## 2023-01-10 VITALS — BP 132/64 | HR 65 | Temp 97.9°F | Ht 67.32 in | Wt 163.0 lb

## 2023-01-10 DIAGNOSIS — S41112A Laceration without foreign body of left upper arm, initial encounter: Secondary | ICD-10-CM

## 2023-01-10 DIAGNOSIS — Z23 Encounter for immunization: Secondary | ICD-10-CM | POA: Diagnosis not present

## 2023-01-10 DIAGNOSIS — E538 Deficiency of other specified B group vitamins: Secondary | ICD-10-CM

## 2023-01-10 DIAGNOSIS — E038 Other specified hypothyroidism: Secondary | ICD-10-CM

## 2023-01-10 DIAGNOSIS — G609 Hereditary and idiopathic neuropathy, unspecified: Secondary | ICD-10-CM

## 2023-01-10 DIAGNOSIS — G2581 Restless legs syndrome: Secondary | ICD-10-CM | POA: Diagnosis not present

## 2023-01-10 DIAGNOSIS — M353 Polymyalgia rheumatica: Secondary | ICD-10-CM

## 2023-01-10 DIAGNOSIS — E041 Nontoxic single thyroid nodule: Secondary | ICD-10-CM | POA: Diagnosis not present

## 2023-01-10 MED ORDER — MUPIROCIN 2 % EX OINT: TOPICAL_OINTMENT | CUTANEOUS | 0 refills | Status: AC

## 2023-01-10 MED ORDER — PREDNISONE 1 MG PO TABS
ORAL_TABLET | ORAL | 3 refills | Status: DC
Start: 2023-01-10 — End: 2023-06-27

## 2023-01-10 MED ORDER — PREGABALIN 25 MG PO CAPS
ORAL_CAPSULE | ORAL | 3 refills | Status: DC
Start: 2023-01-10 — End: 2023-02-07

## 2023-01-10 MED ORDER — PREDNISONE 5 MG PO TABS: ORAL_TABLET | ORAL | 2 refills | Status: DC

## 2023-01-10 NOTE — Progress Notes (Signed)
Subjective:    Patient ID: Thomas [name], male    DOB: [birth year], 88 y.o.   MRN: [id number]  Discussed the use of AI scribe software for clinical note transcription with the patient, who gave verbal consent to proceed.  History of Present Illness   The patient, known to have polymyalgia rheumatica, presents with persistent tingling and restlessness in the legs, particularly in the hour and a half before bedtime. The patient describes a sensation of pins and needles in the legs that is present throughout the day, starting from midday. The patient also reports a burning sensation in the feet, which he has been managing by applying an ice pack for five to six minutes at a time. The symptoms seem to subside upon lying down to sleep. The patient has been experiencing these symptoms for approximately three to six months.  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 The patient has been applying over-the-counter triple antibiotic ointment to the wound. The patient also mentions a change in bowel movements, describing the stool as consistently soft, which he attributes to his toothpaste.  The patient has been on a regimen of prednisone for the polymyalgia rheumatica, which he has been tapering down gradually. He is currently taking 2.5 tablets of 5mg  prednisone and plan to reduce to 2 tablets in the coming week. The patient also takes thyroid medication, pantoprazole for stomach acid, and a blood pressure medication daily. He also uses a Symbicort inhaler as needed for breathing. The patient has been adherent to his medication regimen.         Review of Systems     Objective:    Physical Exam   CHEST: lungs clear to auscultation CARDIOVASCULAR: regular rate and rhythm, normal heart sounds EXTREMITIES: no edema in ankles SKIN: skin tear on arm           Assessment & Plan:  Assessment and Plan     Peripheral Neuropathy Chronic pins and needles sensation in legs, worsening throughout the day. Burning sensation in feet relieved by cold application. Symptoms suggestive of restless leg syndrome in the evening. B12 level previously low-normal. -Start Pregabalin (Lyrica) at a low dose, taken at night. -Check B12 level. -If B12 level is low, consider B12 injections.  Polymyalgia Rheumatica Improvement in symptoms and inflammatory markers with Prednisone. Currently on 12.5mg  daily (2.5 tablets of 5mg ). -Continue Prednisone taper as follows:10mg  daily (2 tablets of 5mg ) for 3 weeks, then 9mg  daily (1 tablet of 5mg  and 4 tablets of 1mg ) for 1 month, then reduce by 1mg  each month. -Recheck thyroid function. -Follow-up in 3-4 months.  Skin Tear Recent injury to arm causing skin tear. Currently applying over-the-counter triple antibiotic ointment. -Prescribe Bactroban ointment for daily application. -If signs of infection develop, patient to contact office.  Tetanus Prophylaxis Unclear when last tetanus shot was administered. Recent injury to arm. -Recommend Tdap or tetanus shot, depending on insurance coverage.  General Health Maintenance -Continue current medications including thyroid medication, pantoprazole, Symbicort inhaler, and blood pressure medication. -Ensure regular bowel movements. -Continue monitoring for reflux symptoms.     I did explain to the patient importance of gradually tapering the prednisone in order to ease up on his symptoms of polymyalgia rheumatica and that when we get the 5 mg we will continue to go down 1 mg/month as long as he is holding up okay he will follow-up in approximately 4  months Patient will get a tetanus shot through the pharmacy

## 2023-01-11 LAB — VITAMIN B12: Vitamin B-12: 364 pg/mL (ref 232–1245)

## 2023-01-11 LAB — TSH+FREE T4
Free T4: 1.34 ng/dL (ref 0.82–1.77)
TSH: 1.62 u[IU]/mL (ref 0.450–4.500)

## 2023-01-28 ENCOUNTER — Other Ambulatory Visit: Payer: Self-pay | Admitting: Family Medicine

## 2023-02-07 ENCOUNTER — Telehealth: Payer: Self-pay | Admitting: Family Medicine

## 2023-02-07 ENCOUNTER — Other Ambulatory Visit: Payer: Self-pay | Admitting: Family Medicine

## 2023-02-07 MED ORDER — PREGABALIN 25 MG PO CAPS: ORAL_CAPSULE | ORAL | 3 refills | Status: DC

## 2023-02-07 NOTE — Telephone Encounter (Signed)
The patient sent a message regarding the pregabalin He states it is helping a little bit but not a lot He is only taking 25 mg each evening He is tolerating it well Given this I recommend going up on the dose to 25 mg twice daily If drowsiness fatigue sleepiness dizziness or other negative effects stop the medicine If tolerating well give [place] update within 3 weeks how it is doing and we can judge whether or not to go up on the dose

## 2023-02-15 ENCOUNTER — Ambulatory Visit: Payer: Medicare Other

## 2023-02-15 VITALS — Ht 68.0 in | Wt 165.0 lb

## 2023-02-15 DIAGNOSIS — Z0000 Encounter for general adult medical examination without abnormal findings: Secondary | ICD-10-CM

## 2023-02-15 NOTE — Progress Notes (Signed)
Subjective:   Thomas [name] is a 88 y.o. male who presents for Medicare Annual/Subsequent preventive examination.  Visit Complete: Virtual I connected with  Thomas [name] on 02/15/23 by a audio enabled telemedicine application and verified that I am speaking with the correct person using two identifiers.  Patient Location: Home  Provider Location: Home Office  I discussed the limitations of evaluation and management by telemedicine. The patient expressed understanding and agreed to proceed.  Vital Signs: Because this visit was a virtual/telehealth visit, some criteria may be missing or patient reported. Any vitals not documented were not able to be obtained and vitals that have been documented are patient reported.  Patient Medicare AWV questionnaire was completed by the patient on 02/15/2023; I have confirmed that all information answered by patient is correct and no changes since this date.  Cardiac Risk Factors include: advanced age (>14men, >28 women);male gender;dyslipidemia;hypertension     Objective:    Today's Vitals   02/15/23 1432  Weight: 165 lb (74.8 kg)  Height: 5\' 8"  (1.727 m)   Body mass index is 25.09 kg/m.     02/15/2023    2:35 PM 07/17/2022   11:33 AM 07/16/2022   10:44 AM 02/01/2022    8:19 AM 10/25/2020   12:56 PM 03/31/2019    1:24 PM 10/02/2018    2:31 PM  Advanced Directives  Does Patient Have a Medical Advance Directive? Yes No No No Yes Yes Yes  Type of Estate agent of [place];Living will    Healthcare Power of [place];Living will Healthcare Power of [place];Living will Healthcare Power of [place];Living will  Does patient want to make changes to medical advance directive?     No - Patient declined No - Patient declined No - Patient declined  Copy of Healthcare Power of Attorney in Chart? No - copy requested    No - copy requested No - copy requested No - copy requested  Would patient like information on creating a  medical advance directive?  No - Patient declined No - Patient declined No - Patient declined       Current Medications (verified) Outpatient Encounter Medications as of 02/15/2023  Medication Sig   albuterol (VENTOLIN HFA) 108 (90 Base) MCG/ACT inhaler Inhale 2 puffs into the lungs every 4 (four) hours as needed.   amLODipine (NORVASC) 2.5 MG tablet Take 1 tablet (2.5 mg total) by mouth daily.   budesonide-formoterol (SYMBICORT) 80-4.5 MCG/ACT inhaler Inhale 2 puffs into the lungs 2 (two) times daily.   fluticasone (FLONASE) 50 MCG/ACT nasal spray USE 1 SPRAY IN EACH NOSTRIL TWICE DAILY.   Hypromellose (ARTIFICIAL TEARS OP) Place 1 drop into both eyes daily as needed (dry eyes).   ibuprofen (ADVIL,MOTRIN) 200 MG tablet Take 400 mg by mouth 2 (two) times daily as needed for headache or moderate pain.   ketoconazole (NIZORAL) 2 % cream APPLY TO AFFECTED AREAS TWICE DAILY AS NEEDED FOR IRRITATION.   levothyroxine (SYNTHROID) 100 MCG tablet 1 daily except Sundays take 1/2 tablet   mometasone (ELOCON) 0.1 % cream APPLY TO AFFECTED AREAS TWICE DAILY AS NEEDED.   mupirocin ointment (BACTROBAN) 2 % Apply thin amount twice daily to the cut   pantoprazole (PROTONIX) 40 MG tablet TAKE ONE TABLET BY MOUTH ONCE DAILY.   pravastatin (PRAVACHOL) 40 MG tablet Take 1 tablet (40 mg total) by mouth at bedtime.   predniSONE (DELTASONE) 1 MG tablet 1 to 4 per day per tapering guidelines for polymyalgia rheumatica  predniSONE (DELTASONE) 5 MG tablet 1 to 2 every day as directed   pregabalin (LYRICA) 25 MG capsule 1 bid for peripheral neuropathy   triamcinolone cream (KENALOG) 0.1 % Apply 1 Application topically 2 (two) times daily.   valsartan (DIOVAN) 80 MG tablet Take 1 tablet (80 mg total) by mouth daily.   No facility-administered encounter medications on file as of 02/15/2023.    Allergies (verified) Penicillins   History: Past Medical History:  Diagnosis Date   Arthritis    "my entire back is gone"    Asthma    GERD (gastroesophageal reflux disease)    H/O hiatal hernia    Hernia    History of stress test 2011   Hypertension    Hypothyroidism    Kidney stones    Pollen allergies    Polycythemia    Polycythemia, secondary 09/11/2013   Renal calculus    some removed by Cystoscopy, one passed spontaneously   Sleep apnea    mild no cpap   Past Surgical History:  Procedure Laterality Date   APPENDECTOMY     50 years ago   BACK SURGERY     L-4 , L-5  patient states that it was years ago.   COLONOSCOPY     at age 46   COLONOSCOPY N/A 04/06/2016   Procedure: COLONOSCOPY;  Surgeon: Malissa Hippo, MD;  Location: [facility];  Service: Endoscopy;  Laterality: N/A;  7:30   ESOPHAGEAL DILATION N/A 07/17/2022   Procedure: ESOPHAGEAL DILATION;  Surgeon: Dolores Frame, MD;  Location: [facility];  Service: Gastroenterology;  Laterality: N/A;   ESOPHAGOGASTRODUODENOSCOPY (EGD) WITH PROPOFOL N/A 07/17/2022   Procedure: ESOPHAGOGASTRODUODENOSCOPY (EGD) WITH PROPOFOL;  Surgeon: Dolores Frame, MD;  Location: [facility];  Service: Gastroenterology;  Laterality: N/A;  1:30pm; ASA 3   EYE SURGERY     cataracts removed- bilateral- /w IOL   INGUINAL HERNIA REPAIR Left 05/14/2016   Procedure: OPEN REPAIR OF SYMPTOMATIC (PAINFUL) LEFT INGUINAL HERNIA WITH MESH;  Surgeon: Ancil Linsey, MD;  Location: [facility];  Service: General;  Laterality: Left;   SINOSCOPY     THYROIDECTOMY Right 09/17/2012   Procedure: RIGHT HEMI-THYROIDECTOMY;  Surgeon: Darletta Moll, MD;  Location: [facility];  Service: ENT;  Laterality: Right;   UPPER GASTROINTESTINAL ENDOSCOPY     egd/ed   YAG LASER APPLICATION Left 02/07/2016   Procedure: YAG LASER APPLICATION;  Surgeon: Jethro Bolus, MD;  Location: [facility];  Service: Ophthalmology;  Laterality: Left;   Family History  Problem Relation Age of Onset   Heart disease Mother    Heart disease Father    Heart disease Brother    Healthy Son    Allergic  rhinitis Neg Hx    Angioedema Neg Hx    Asthma Neg Hx    Atopy Neg Hx    Eczema Neg Hx    Immunodeficiency Neg Hx    Urticaria Neg Hx    Social History   Socioeconomic History   Marital status: Widowed    Spouse name: Not on file   Number of children: Not on file   Years of education: Not on file   Highest education level: Not on file  Occupational History   Not on file  Tobacco Use   Smoking status: Former    Types: Cigarettes    Passive exposure: Never   Smokeless tobacco: Former    Types: Chew   Tobacco comments:    Patient states that he  has been chewing for 60 plus years  Vaping Use   Vaping status: Never Used  Substance and Sexual Activity   Alcohol use: No    Comment: Patient states that he drinks a bout a case of beer a year   Drug use: No   Sexual activity: Never  Other Topics Concern   Not on file  Social History Narrative   Not on file   Social Determinants of Health   Financial Resource Strain: Low Risk  (02/15/2023)   Overall Financial Resource Strain (CARDIA)    Difficulty of Paying Living Expenses: Not hard at all  Food Insecurity: No Food Insecurity (02/15/2023)   Hunger Vital Sign    Worried About Running Out of Food in the Last Year: Never true    Ran Out of Food in the Last Year: Never true  Transportation Needs: No Transportation Needs (02/15/2023)   PRAPARE - Administrator, Civil Service (Medical): No    Lack of Transportation (Non-Medical): No  Physical Activity: Insufficiently Active (02/15/2023)   Exercise Vital Sign    Days of Exercise per Week: 3 days    Minutes of Exercise per Session: 30 min  Stress: No Stress Concern Present (02/15/2023)   [employer] of Occupational Health - Occupational Stress Questionnaire    Feeling of Stress : Not at all  Social Connections: Socially Isolated (02/15/2023)   Social Connection and Isolation Panel [NHANES]    Frequency of Communication with Friends and Family: More than three times  a week    Frequency of Social Gatherings with Friends and Family: More than three times a week    Attends Religious Services: Never    Database administrator or Organizations: No    Attends Banker Meetings: Never    Marital Status: Widowed    Tobacco Counseling Counseling given: Not Answered Tobacco comments: Patient states that he has been chewing for 60 plus years   Clinical Intake:  Pre-visit preparation completed: Yes  Pain : No/denies pain     Nutritional Risks: None Diabetes: No  How often do you need to have someone help you when you read instructions, pamphlets, or other written materials from your doctor or pharmacy?: 1 - Never  Interpreter Needed?: No  Information entered by :: Renie Ora, LPN   Activities of Daily Living    02/15/2023    2:35 PM 07/16/2022   10:46 AM  In your present state of health, do you have any difficulty performing the following activities:  Hearing? 0   Vision? 0   Difficulty concentrating or making decisions? 0   Walking or climbing stairs? 0   Dressing or bathing? 0   Doing errands, shopping? 0 0  Preparing Food and eating ? N   Using the Toilet? N   In the past six months, have you accidently leaked urine? N   Do you have problems with loss of bowel control? N   Managing your Medications? N   Managing your Finances? N   Housekeeping or managing your Housekeeping? N     Patient Care Team: Babs Sciara, MD as PCP - General (Family Medicine)  Indicate any recent Medical Services you may have received from other than [facility] providers in the past year (date may be approximate).     Assessment:   This is a routine wellness examination for Chadd.  Hearing/Vision screen Vision Screening - Comments:: Wears rx glasses - up to date with routine eye exams with  Dr.Cotter    Goals Addressed             This Visit's Progress    DIET - EAT MORE FRUITS AND VEGETABLES   On track      Depression Screen     02/15/2023    2:33 PM 01/10/2023   11:30 AM 11/12/2022    1:49 PM 09/05/2022    1:08 PM 07/03/2022    8:36 AM 02/01/2022    8:17 AM 01/01/2022    8:42 AM  PHQ 2/9 Scores  PHQ - 2 Score 0 0 2 0 0 0 0  PHQ- 9 Score 0 0 7   0     Fall Risk    02/15/2023    2:32 PM 09/05/2022    1:08 PM 07/03/2022    8:36 AM 02/01/2022    8:19 AM 01/01/2022    8:42 AM  Fall Risk   Falls in the past year? 0 0 0 0 0  Number falls in past yr: 0 0  0 0  Injury with Fall? 0 0  0 0  Risk for fall due to : No Fall Risks No Fall Risks No Fall Risks No Fall Risks No Fall Risks  Follow up Falls prevention discussed Falls prevention discussed Falls evaluation completed Falls prevention discussed;Falls evaluation completed Falls evaluation completed    MEDICARE RISK AT HOME: Medicare Risk at Home Any stairs in or around the home?: No If so, are there any without handrails?: No Home free of loose throw rugs in walkways, pet beds, electrical cords, etc?: Yes Adequate lighting in your home to reduce risk of falls?: Yes Life alert?: No Use of a cane, walker or w/c?: No Grab bars in the bathroom?: Yes Shower chair or bench in shower?: Yes Elevated toilet seat or a handicapped toilet?: Yes  TIMED UP AND GO:  Was the test performed?  No    Cognitive Function:        02/15/2023    2:35 PM 02/01/2022    8:21 AM 01/30/2021   10:02 AM  6CIT Screen  What Year? 0 points 0 points 0 points  What month? 0 points 0 points 0 points  What time? 0 points 0 points 0 points  Count back from 20 0 points 0 points 0 points  Months in reverse 0 points 0 points 0 points  Repeat phrase 0 points 0 points 0 points  Total Score 0 points 0 points 0 points    Immunizations Immunization History  Administered Date(s) Administered   Fluad Quad(high Dose 65+) 02/12/2020, 01/02/2021, 01/01/2022   Fluad Trivalent(High Dose 65+) 01/10/2023   Influenza, High Dose Seasonal PF 01/28/2012, 12/19/2018, 12/19/2018   Influenza,inj,Quad  PF,6+ Mos 01/21/2014   Influenza-Unspecified 01/15/2015, 02/16/2015, 02/13/2016   Moderna Sars-Covid-2 Vaccination 05/21/2019, 06/19/2019   PNEUMOCOCCAL CONJUGATE-20 01/01/2022   Pneumococcal Conjugate-13 10/30/2013   Pneumococcal Polysaccharide-23 02/24/2007, 01/28/2012   Zoster, Live 02/16/2015    TDAP status: Due, Education has been provided regarding the importance of this vaccine. Advised may receive this vaccine at local pharmacy or Health Dept. Aware to provide a copy of the vaccination record if obtained from local pharmacy or Health Dept. Verbalized acceptance and understanding.  Flu Vaccine status: Up to date  Pneumococcal vaccine status: Up to date  Covid-19 vaccine status: Completed vaccines  Qualifies for Shingles Vaccine? Yes   Zostavax completed Yes   Shingrix Completed?: Yes  Screening Tests Health Maintenance  Topic Date Due   Zoster Vaccines- Shingrix (1 of  2) 10/24/1953   COVID-19 Vaccine (3 - Moderna risk series) 07/17/2019   Medicare Annual Wellness (AWV)  02/15/2024   Pneumonia Vaccine 3+ Years old  Completed   INFLUENZA VACCINE  Completed   HPV VACCINES  Aged Out   DTaP/Tdap/Td  Discontinued    Health Maintenance  Health Maintenance Due  Topic Date Due   Zoster Vaccines- Shingrix (1 of 2) 10/24/1953   COVID-19 Vaccine (3 - Moderna risk series) 07/17/2019    Colorectal cancer screening: No longer required.   Lung Cancer Screening: (Low Dose CT Chest recommended if Age 23-80 years, 20 pack-year currently smoking OR have quit w/in 15years.) does not qualify.   Lung Cancer Screening Referral: n/a  Additional Screening:  Hepatitis C Screening: does not qualify;   Vision Screening: Recommended annual ophthalmology exams for early detection of glaucoma and other disorders of the eye. Is the patient up to date with their annual eye exam?  Yes  Who is the provider or what is the name of the office in which the patient attends annual eye exams?  Dr.cotter  If pt is not established with a provider, would they like to be referred to a provider to establish care? No .   Dental Screening: Recommended annual dental exams for proper oral hygiene   Community Resource Referral / Chronic Care Management: CRR required this visit?  No   CCM required this visit?  No     Plan:     I have personally reviewed and noted the following in the patient's chart:   Medical and social history Use of alcohol, tobacco or illicit drugs  Current medications and supplements including opioid prescriptions. Patient is not currently taking opioid prescriptions. Functional ability and status Nutritional status Physical activity Advanced directives List of other physicians Hospitalizations, surgeries, and ER visits in previous 12 months Vitals Screenings to include cognitive, depression, and falls Referrals and appointments  In addition, I have reviewed and discussed with patient certain preventive protocols, quality metrics, and best practice recommendations. A written personalized care plan for preventive services as well as general preventive health recommendations were provided to patient.     Lorrene Reid, LPN   16/04/958   After Visit Summary: (MyChart) Due to this being a telephonic visit, the after visit summary with patients personalized plan was offered to patient via MyChart   Nurse Notes: none

## 2023-02-15 NOTE — Patient Instructions (Signed)
Thomas [name] , Thank you for taking time to come for your Medicare Wellness Visit. I appreciate your ongoing commitment to your health goals. Please review the following plan we discussed and let me know if I can assist you in the future.   Referrals/Orders/Follow-Ups/Clinician Recommendations: Aim for 30 minutes of exercise or brisk walking, 6-8 glasses of water, and 5 servings of fruits and vegetables each day.   This is a list of the screening recommended for you and due dates:  Health Maintenance  Topic Date Due   Zoster (Shingles) Vaccine (1 of 2) 10[birth year]24/1953   COVID-19 Vaccine (3 - Moderna risk series) 07/17/2019   Medicare Annual Wellness Visit  02/15/2024   Pneumonia Vaccine  Completed   Flu Shot  Completed   HPV Vaccine  Aged Out   DTaP/Tdap/Td vaccine  Discontinued    Advanced directives: (Copy Requested) Please bring a copy of your health care power of attorney and living will to the office to be added to your chart at your convenience.  Next Medicare Annual Wellness Visit scheduled for next year: Yes  insert Preventive Care attachment Insert FALL PREVENTION attachment if needed

## 2023-02-26 ENCOUNTER — Other Ambulatory Visit: Payer: Self-pay | Admitting: Family Medicine

## 2023-03-27 ENCOUNTER — Other Ambulatory Visit: Payer: Self-pay | Admitting: Family Medicine

## 2023-04-12 DIAGNOSIS — H04123 Dry eye syndrome of bilateral lacrimal glands: Secondary | ICD-10-CM | POA: Diagnosis not present

## 2023-04-29 ENCOUNTER — Other Ambulatory Visit: Payer: Self-pay | Admitting: Family Medicine

## 2023-05-13 ENCOUNTER — Ambulatory Visit (INDEPENDENT_AMBULATORY_CARE_PROVIDER_SITE_OTHER): Payer: Medicare Other | Admitting: Family Medicine

## 2023-05-13 VITALS — BP 134/78 | HR 83 | Temp 98.4°F | Ht 68.0 in | Wt 173.0 lb

## 2023-05-13 DIAGNOSIS — G609 Hereditary and idiopathic neuropathy, unspecified: Secondary | ICD-10-CM

## 2023-05-13 DIAGNOSIS — I10 Essential (primary) hypertension: Secondary | ICD-10-CM

## 2023-05-13 DIAGNOSIS — E538 Deficiency of other specified B group vitamins: Secondary | ICD-10-CM

## 2023-05-13 DIAGNOSIS — Z79899 Other long term (current) drug therapy: Secondary | ICD-10-CM

## 2023-05-13 DIAGNOSIS — M353 Polymyalgia rheumatica: Secondary | ICD-10-CM | POA: Diagnosis not present

## 2023-05-13 DIAGNOSIS — R070 Pain in throat: Secondary | ICD-10-CM

## 2023-05-13 DIAGNOSIS — G2581 Restless legs syndrome: Secondary | ICD-10-CM

## 2023-05-13 DIAGNOSIS — Z720 Tobacco use: Secondary | ICD-10-CM | POA: Diagnosis not present

## 2023-05-13 MED ORDER — ROPINIROLE HCL 0.5 MG PO TABS: ORAL_TABLET | ORAL | 1 refills | Status: DC

## 2023-05-13 NOTE — Progress Notes (Addendum)
Subjective:    Patient ID: Thomas [name], male    DOB: 08/07/1934, 89 y.o.   MRN: [id number]  Discussed the use of AI scribe software for clinical note transcription with the patient, who gave verbal consent to proceed.  History of Present Illness   The patient, with a history of peripheral neuropathy, presents with complaints of blurred vision and nocturnal restlessness in the legs. He reports that one of his medications, possibly amlodipine or pregabalin, may be causing the blurred vision. However, he also has a diagnosis of dry eye, which could be contributing to this symptom. The patient's peripheral neuropathy symptoms are variable, with some days being fair and others being bad. The symptoms are most pronounced in the evenings, particularly between 6 and 10 PM, and are characterized by numbness and a burning sensation in the feet. The patient finds relief from these symptoms by using a massager. The effectiveness of pregabalin in managing these symptoms is uncertain.  The patient also reports a recent issue with soreness in the back of the throat and a cough that produced green sputum. He has been gargling with salt water twice daily, which has led to some improvement. The patient has a history of bacterial infection in the mouth following dental procedures, and he suspects a similar issue may be occurring now.  The patient is on a regimen of prednisone for an polymyalgia rheumatica, which he is currently tapering down. He also takes cholesterol medication and a B12 supplement. Despite his health issues, the patient maintains an active lifestyle, including regular walking. He has a history of tobacco use, which he has not quit.         Review of Systems     Objective:    Physical Exam   VITALS: BP- 132/76 NECK: No abnormalities noted upon palpation.    General-in no acute distress Eyes-no discharge Lungs-respiratory rate normal, CTA CV-no murmurs,RRR Extremities skin warm dry  no edema Neuro grossly normal Behavior normal, alert        Assessment & Plan:  Assessment and Plan    Peripheral Neuropathy Evening symptoms of numbness, burning, and restlessness in the legs, more pronounced in the right leg. Unclear benefit from Lyrica (pregabalin). Possible overlap with Restless Leg Syndrome. -Discontinue Lyrica (pregabalin). -Start new medication (name not specified in conversation) at 6 PM daily for symptom control.  Dry Eye Complaints of blurred vision, possibly due to dry eye or medication side effects. Currently using eye drops for symptom management. -Continue current eye drop regimen. -Consider medication review if symptoms persist.  Throat Discomfort Recent history of throat discomfort and coughing up green substance, possibly due to bacterial infection. Improvement noted with salt water gargling. -Refer to Ear, Nose, and Throat specialist for further evaluation and management.  Prednisone Taper Currently on prednisone for PMR, with a plan to taper the dose. -Continue prednisone taper as planned:5mg  daily for 30 days, then 4mg  daily for 30 days, then 3mg  daily for 30 days. -Follow-up in 3-4 months to assess response to taper.  General Health Maintenance -Continue current regimen of B12 supplementation. -Order blood work prior to next visit. -Schedule follow-up appointment in 3-4 months.     1. Polymyalgia rheumatica (HCC) (Primary) Gradual taper of prednisone so far not having any symptoms or side effects Now going to 5mg  every day for 30 days then 4mg  every day for 30 then 3 mg every day for 30 and so forth as lng as no reoccurance  2. Idiopathic peripheral neuropathy  He does not feel Lyrica is helping tolerating symptoms shared discussion stop Lyrica  3. Restless leg Requip 0.5 mg each evening take around 6 PM follow-up 3 to 4 months  4. Essential hypertension HTN- patient seen for follow-up regarding HTN.   Diet, medication compliance,  appropriate labs and refills were completed.   Importance of keeping blood pressure under good control to lessen the risk of complications discussed Regular follow-up visits discussed   5. B12 deficiency Continue B12 supplement check labs before next visit  6. Throat pain Referral to ENT because of sore throat over the past month no identifiable cause but he does chew tobacco so therefore he needs ENT evaluation of the larynx  7. Chews tobacco Patient relates that this is just part of what he does unlikely he will stop at any time in the near future

## 2023-05-13 NOTE — Addendum Note (Signed)
Addended byTrinidad Curet on: 05/13/2023 12:59 PM   Modules accepted: Orders

## 2023-05-20 ENCOUNTER — Ambulatory Visit (INDEPENDENT_AMBULATORY_CARE_PROVIDER_SITE_OTHER): Payer: Medicare Other | Admitting: Nurse Practitioner

## 2023-05-20 ENCOUNTER — Encounter: Payer: Self-pay | Admitting: Nurse Practitioner

## 2023-05-20 VITALS — BP 148/80 | HR 55 | Temp 98.1°F | Ht 68.0 in | Wt 173.0 lb

## 2023-05-20 DIAGNOSIS — J019 Acute sinusitis, unspecified: Secondary | ICD-10-CM

## 2023-05-20 DIAGNOSIS — B9689 Other specified bacterial agents as the cause of diseases classified elsewhere: Secondary | ICD-10-CM

## 2023-05-20 MED ORDER — DOXYCYCLINE HYCLATE 100 MG PO CAPS: 100.0000 mg | ORAL_CAPSULE | Freq: Two times a day (BID) | ORAL | 0 refills | Status: DC

## 2023-05-20 NOTE — Progress Notes (Signed)
   Subjective:    Patient ID: Thomas [name], male    DOB: Apr 28, 1934, 89 y.o.   MRN: [id number]  HPI Presents for complaints of sinus congestion and pressure that began 6 days ago.  Having a frequent cough mostly nonproductive but occasionally produces green/yellow mucus.  No fever.  Frontal area headache.  No sore throat or ear pain.  No wheezing.  No chest pain.  Slight chest tightness with prolonged cough.  Has been using his Symbicort.  Using Flonase.  Has albuterol inhaler as a backup plan but has not had to use this in a long time. Social History   Tobacco Use   Smoking status: Former    Types: Cigarettes    Passive exposure: Never   Smokeless tobacco: Former    Types: Chew   Tobacco comments:    Patient states that he has been chewing for 60 plus years  Vaping Use   Vaping status: Never Used  Substance Use Topics   Alcohol use: No    Comment: Patient states that he drinks a bout a case of beer a year   Drug use: No           Objective:   Physical Exam NAD.  Alert, oriented.  TMs retracted bilaterally, no erythema.  Pharynx mildly injected with green PND noted.  Neck supple with mild soft anterior cervical adenopathy.  Lungs clear.  Occasional congested cough noted.  No tachypnea.  Heart regular rate rhythm. Today's Vitals   05/20/23 1531  BP: (!) 148/80  Pulse: (!) 55  Temp: 98.1 F (36.7 C)  SpO2: 97%  Weight: 173 lb (78.5 kg)  Height: 5\' 8"  (1.727 m)   Body mass index is 26.3 kg/m.        Assessment & Plan:  Acute bacterial rhinosinusitis Meds ordered this encounter  Medications   doxycycline (VIBRAMYCIN) 100 MG capsule    Sig: Take 1 capsule (100 mg total) by mouth 2 (two) times daily.    Dispense:  14 capsule    Refill:  0    Supervising Provider:   Lilyan Punt A [[id number]]   Continue Flonase and Symbicort as directed.  OTC meds as directed for congestion. Warning signs reviewed.  Call back if symptoms worsen or persist.

## 2023-05-24 ENCOUNTER — Other Ambulatory Visit (INDEPENDENT_AMBULATORY_CARE_PROVIDER_SITE_OTHER): Payer: Self-pay | Admitting: Gastroenterology

## 2023-05-25 ENCOUNTER — Other Ambulatory Visit: Payer: Self-pay | Admitting: Family Medicine

## 2023-05-27 ENCOUNTER — Ambulatory Visit: Payer: Medicare Other | Admitting: Internal Medicine

## 2023-05-27 ENCOUNTER — Other Ambulatory Visit: Payer: Self-pay

## 2023-05-27 ENCOUNTER — Encounter: Payer: Self-pay | Admitting: Internal Medicine

## 2023-05-27 VITALS — BP 136/82 | HR 66 | Temp 98.1°F | Resp 18 | Wt 176.1 lb

## 2023-05-27 DIAGNOSIS — J302 Other seasonal allergic rhinitis: Secondary | ICD-10-CM

## 2023-05-27 DIAGNOSIS — J3089 Other allergic rhinitis: Secondary | ICD-10-CM | POA: Diagnosis not present

## 2023-05-27 DIAGNOSIS — J4540 Moderate persistent asthma, uncomplicated: Secondary | ICD-10-CM

## 2023-05-27 MED ORDER — BUDESONIDE-FORMOTEROL FUMARATE 80-4.5 MCG/ACT IN AERO: 2.0000 | INHALATION_SPRAY | Freq: Two times a day (BID) | RESPIRATORY_TRACT | 5 refills | Status: DC

## 2023-05-27 MED ORDER — VALSARTAN 80 MG PO TABS: 80.0000 mg | ORAL_TABLET | Freq: Every day | ORAL | 1 refills | Status: DC

## 2023-05-27 MED ORDER — PRAVASTATIN SODIUM 40 MG PO TABS: 40.0000 mg | ORAL_TABLET | Freq: Every day | ORAL | 1 refills | Status: DC

## 2023-05-27 MED ORDER — ALBUTEROL SULFATE HFA 108 (90 BASE) MCG/ACT IN AERS: 2.0000 | INHALATION_SPRAY | RESPIRATORY_TRACT | 1 refills | Status: DC | PRN

## 2023-05-27 NOTE — Progress Notes (Signed)
   FOLLOW UP Date of Service/Encounter:  05/27/23   Subjective:  Thomas [name] (DOB: 02/17/1935) is a 89 y.o. male who [facility] for follow up for asthma and allergic rhinitis.   History obtained from: chart review and patient. Last seen on 11/21/2022 and at the time was doing well on Symbicort , Clarinex, PRN Albuterol .   Since last visit, reports asthma has done well.  He remains on Symbicort  1 puff BID, rarely needs Albuterol , can't recall last use of it.  No ER visits/oral prednisone  since last visit for asthma.  He was recently treated for a sinus infection and had pressure/congestion/cough/headaches, just completed course of doxycycline .  Still has some congestion and thin drainage.  Using Flonase .   Past Medical History: Past Medical History:  Diagnosis Date   Arthritis    "my entire back is gone"   Asthma    GERD (gastroesophageal reflux disease)    H/O hiatal hernia    Hernia    History of stress test 2011   Hypertension    Hypothyroidism    Kidney stones    Pollen allergies    Polycythemia    Polycythemia, secondary 09/11/2013   Renal calculus    some removed by Cystoscopy, one passed spontaneously   Sleep apnea    mild no cpap    Objective:  BP 136/82   Pulse 66   Temp 98.1 F (36.7 C)   Resp 18   Wt 176 lb 2 oz (79.9 kg)   SpO2 97%   BMI 26.78 kg/m  Body mass index is 26.78 kg/m. Physical Exam: GEN: alert, well developed HEENT: clear conjunctiva, nose with mild inferior turbinate hypertrophy, pink nasal mucosa, + clear rhinorrhea, + cobblestoning HEART: regular rate and rhythm, no murmur LUNGS: clear to auscultation bilaterally, no coughing, unlabored respiration SKIN: no rashes or lesions  Assessment:   1. Seasonal and perennial allergic rhinitis   2. Moderate persistent asthma, uncomplicated     Plan/Recommendations:  Moderate persistent asthma - Controlled, will do spirometry at next visit. MDI  technique discussed.  - Daily controller medication(s): Symbicort  80/4.20mcg one puff twice daily with spacer - Rescue medications: Albuterol  1-2 puffs every 4-6 hours as needed for wheezing or shortness of breath - Asthma control goals:  * Full participation in all desired activities (may need albuterol  before activity) * Albuterol  use two time or less a week on average (not counting use with activity) * Cough interfering with sleep two time or less a month * Oral steroids no more than once a year * No hospitalizations  Seasonal and perennial allergic rhinitis  - Uncontrolled rhinitis due to sinus infection recently.  Sxs will improve with time, can add saline rinses.  - Previously completed AIT in 2021.  - Use nasal saline rinses before nose sprays such as with Neilmed Sinus Rinse.  Use distilled water .   - Use Flonase  1 spray each nostril twice daily. Aim upward and outward.  - Continue with Clarinex 5mg  once daily as needed for runny nose, sneezing, itchy watery eyes.      Return in about 6 months (around 11/24/2023).  Kristen Petri, MD [facility]

## 2023-05-27 NOTE — Patient Instructions (Addendum)
 Moderate persistent asthma - Daily controller medication(s): Symbicort  80/4.15mcg one puff twice daily with spacer - Rescue medications: Albuterol  1-2 puffs every 4-6 hours as needed for wheezing or shortness of breath - Asthma control goals:  * Full participation in all desired activities (may need albuterol  before activity) * Albuterol  use two time or less a week on average (not counting use with activity) * Cough interfering with sleep two time or less a month * Oral steroids no more than once a year * No hospitalizations  Seasonal and perennial allergic rhinitis  - Use nasal saline rinses before nose sprays such as with Neilmed Sinus Rinse.  Use distilled water .   - Use Flonase  1 spray each nostril twice daily. Aim upward and outward.  - Continue with Clarinex 5mg  once daily as needed for runny nose, sneezing, itchy watery eyes.

## 2023-06-04 ENCOUNTER — Other Ambulatory Visit: Payer: Self-pay | Admitting: Family Medicine

## 2023-06-24 ENCOUNTER — Other Ambulatory Visit: Payer: Self-pay | Admitting: Family Medicine

## 2023-06-27 ENCOUNTER — Institutional Professional Consult (permissible substitution) (INDEPENDENT_AMBULATORY_CARE_PROVIDER_SITE_OTHER): Payer: Medicare Other | Admitting: Otolaryngology

## 2023-06-27 ENCOUNTER — Other Ambulatory Visit: Payer: Self-pay | Admitting: Family Medicine

## 2023-06-27 MED ORDER — PREDNISONE 1 MG PO TABS: ORAL_TABLET | ORAL | 1 refills | Status: DC

## 2023-08-06 ENCOUNTER — Ambulatory Visit: Payer: Self-pay

## 2023-08-06 NOTE — Telephone Encounter (Signed)
 Patient scheduled appointment.

## 2023-08-06 NOTE — Telephone Encounter (Signed)
  Chief Complaint: Cough - green phlegm Symptoms: above Frequency: 1 week Pertinent Negatives: Patient denies fever, chest pain Disposition: [] ED /[] Urgent Care (no appt availability in office) / [x] Appointment(In office/virtual)/ []  [place] [place] [facility]/ [] Home Care/ [] Refused Recommended Disposition /[] [place] [facility]/ []  Follow-up with PCP Additional Notes: Pt has had cough and sinus congestion for 1 week. Pt states that sputum is now green. Pt scheduled for tomorrow afternoon.    Copied from CRM [id number]. Topic: Clinical - Red Word Triage >> Aug 06, 2023  1:09 PM Thomas [name] wrote: Red Word that prompted transfer to Nurse Triage: Coughing/ sneezing up green phlegm Reason for Disposition  Cough has been present for > 3 weeks  Answer Assessment - Initial Assessment Questions 1. ONSET: "When did the cough begin?"      1 week 2. SEVERITY: "How bad is the cough today?"      off and on every 30 minutes 3. SPUTUM: "Describe the color of your sputum" (none, dry cough; clear, white, yellow, green)     Green 4. HEMOPTYSIS: "Are you coughing up any blood?" If so ask: "How much?" (flecks, streaks, tablespoons, etc.)     no 5. DIFFICULTY BREATHING: "Are you having difficulty breathing?" If Yes, ask: "How bad is it?" (e.g., mild, moderate, severe)    - MILD: No SOB at rest, mild SOB with walking, speaks normally in sentences, can lie down, no retractions, pulse < 100.    - MODERATE: SOB at rest, SOB with minimal exertion and prefers to sit, cannot lie down flat, speaks in phrases, mild retractions, audible wheezing, pulse 100-120.    - SEVERE: Very SOB at rest, speaks in single words, struggling to breathe, sitting hunched forward, retractions, pulse > 120      mild 6. FEVER: "Do you have a fever?" If Yes, ask: "What is your temperature, how was it measured, and when did it start?"     no 7. CARDIAC HISTORY: "Do you have any history of heart disease?" (e.g., heart attack, congestive  heart failure)      no 8. LUNG HISTORY: "Do you have any history of lung disease?"  (e.g., pulmonary embolus, asthma, emphysema)     yes 9. PE RISK FACTORS: "Do you have a history of blood clots?" (or: recent major surgery, recent prolonged travel, bedridden)      10. OTHER SYMPTOMS: "Do you have any other symptoms?" (e.g., runny nose, wheezing, chest pain)       Sinus drainage  Protocols used: Cough - Acute Productive-A-AH

## 2023-08-07 ENCOUNTER — Encounter: Payer: Self-pay | Admitting: Family Medicine

## 2023-08-07 ENCOUNTER — Ambulatory Visit (INDEPENDENT_AMBULATORY_CARE_PROVIDER_SITE_OTHER): Admitting: Family Medicine

## 2023-08-07 VITALS — BP 137/72 | HR 66 | Temp 98.6°F | Ht 68.0 in | Wt 172.0 lb

## 2023-08-07 DIAGNOSIS — E785 Hyperlipidemia, unspecified: Secondary | ICD-10-CM

## 2023-08-07 DIAGNOSIS — B9689 Other specified bacterial agents as the cause of diseases classified elsewhere: Secondary | ICD-10-CM | POA: Diagnosis not present

## 2023-08-07 DIAGNOSIS — E038 Other specified hypothyroidism: Secondary | ICD-10-CM

## 2023-08-07 DIAGNOSIS — G609 Hereditary and idiopathic neuropathy, unspecified: Secondary | ICD-10-CM | POA: Diagnosis not present

## 2023-08-07 DIAGNOSIS — Z79899 Other long term (current) drug therapy: Secondary | ICD-10-CM

## 2023-08-07 DIAGNOSIS — J019 Acute sinusitis, unspecified: Secondary | ICD-10-CM | POA: Diagnosis not present

## 2023-08-07 DIAGNOSIS — M353 Polymyalgia rheumatica: Secondary | ICD-10-CM | POA: Diagnosis not present

## 2023-08-07 DIAGNOSIS — E538 Deficiency of other specified B group vitamins: Secondary | ICD-10-CM | POA: Diagnosis not present

## 2023-08-07 DIAGNOSIS — I10 Essential (primary) hypertension: Secondary | ICD-10-CM

## 2023-08-07 MED ORDER — LEVOFLOXACIN 500 MG PO TABS: 500.0000 mg | ORAL_TABLET | Freq: Every day | ORAL | 0 refills | Status: DC

## 2023-08-07 NOTE — Progress Notes (Signed)
   Subjective:    Patient ID: Thomas [name], male    DOB: October 09, 1934, 89 y.o.   MRN: [id number]  HPI  Cough and chest congestion x weeks  Coughing up green mucus , no fevers  Discussed the use of AI scribe software for clinical note transcription with the patient, who gave verbal consent to proceed.  History of Present Illness   Thomas [name] is an 89 year old male who presents with a persistent cough and respiratory symptoms.  He has been experiencing symptoms for the past two weeks, with significant worsening in the last week. The cough started as dry but has become productive, with a large amount of phlegm, particularly noted yesterday. No pain in the head or chest, but he experiences shortness of breath upon exertion. He uses a Symbicort  inhaler twice daily, which provides some relief, and has not needed to use Albuterol  as a rescue inhaler.  No fever. He reports blowing out green nasal discharge. He denies any significant allergies this spring, attributing this to wearing a mask while mowing due to high pollen levels.  He has a history of hearing loss, requiring the use of hearing aids to hear the television and other sounds. Without the hearing aids, he cannot hear the clock tick and experiences window vibrations.      Review of Systems     Objective:   Physical Exam  General-in no acute distress Eyes-no discharge Lungs-respiratory rate normal, CTA CV-no murmurs,RRR Extremities skin warm dry no edema Neuro grossly normal Behavior normal, alert       Assessment & Plan:   Acute rhinosinusitis Antibiotics prescribed Warning signs discussed Lab work ordered for his upcoming visit

## 2023-08-08 DIAGNOSIS — I10 Essential (primary) hypertension: Secondary | ICD-10-CM | POA: Diagnosis not present

## 2023-08-08 DIAGNOSIS — M353 Polymyalgia rheumatica: Secondary | ICD-10-CM | POA: Diagnosis not present

## 2023-08-08 DIAGNOSIS — E538 Deficiency of other specified B group vitamins: Secondary | ICD-10-CM | POA: Diagnosis not present

## 2023-08-08 DIAGNOSIS — Z79899 Other long term (current) drug therapy: Secondary | ICD-10-CM | POA: Diagnosis not present

## 2023-08-09 LAB — BASIC METABOLIC PANEL WITH GFR
BUN/Creatinine Ratio: 8 — ABNORMAL LOW (ref 10–24)
BUN: 8 mg/dL (ref 8–27)
CO2: 23 mmol/L (ref 20–29)
Calcium: 9 mg/dL (ref 8.6–10.2)
Chloride: 103 mmol/L (ref 96–106)
Creatinine, Ser: 0.96 mg/dL (ref 0.76–1.27)
Glucose: 125 mg/dL — ABNORMAL HIGH (ref 70–99)
Potassium: 3.7 mmol/L (ref 3.5–5.2)
Sodium: 141 mmol/L (ref 134–144)
eGFR: 77 mL/min/{1.73_m2} (ref >59)

## 2023-08-09 LAB — LIPID PANEL
Chol/HDL Ratio: 3.2 ratio (ref 0.0–5.0)
Cholesterol, Total: 138 mg/dL (ref 100–199)
HDL: 43 mg/dL (ref >39)
LDL Chol Calc (NIH): 75 mg/dL (ref 0–99)
Triglycerides: 111 mg/dL (ref 0–149)
VLDL Cholesterol Cal: 20 mg/dL (ref 5–40)

## 2023-08-09 LAB — CBC WITH DIFFERENTIAL/PLATELET
Basophils Absolute: 0 10*3/uL (ref 0.0–0.2)
Basos: 1 %
EOS (ABSOLUTE): 0.3 10*3/uL (ref 0.0–0.4)
Eos: 4 %
Hematocrit: 49 % (ref 37.5–51.0)
Hemoglobin: 16.4 g/dL (ref 13.0–17.7)
Immature Grans (Abs): 0 10*3/uL (ref 0.0–0.1)
Immature Granulocytes: 0 %
Lymphocytes Absolute: 1.4 10*3/uL (ref 0.7–3.1)
Lymphs: 21 %
MCH: 33.3 pg — ABNORMAL HIGH (ref 26.6–33.0)
MCHC: 33.5 g/dL (ref 31.5–35.7)
MCV: 99 fL — ABNORMAL HIGH (ref 79–97)
Monocytes Absolute: 0.6 10*3/uL (ref 0.1–0.9)
Monocytes: 8 %
Neutrophils Absolute: 4.3 10*3/uL (ref 1.4–7.0)
Neutrophils: 66 %
Platelets: 191 10*3/uL (ref 150–450)
RBC: 4.93 x10E6/uL (ref 4.14–5.80)
RDW: 12.2 % (ref 11.6–15.4)
WBC: 6.6 10*3/uL (ref 3.4–10.8)

## 2023-08-09 LAB — TSH+FREE T4
Free T4: 1.36 ng/dL (ref 0.82–1.77)
TSH: 4.14 u[IU]/mL (ref 0.450–4.500)

## 2023-08-09 LAB — SEDIMENTATION RATE: Sed Rate: 5 mm/h (ref 0–30)

## 2023-08-09 LAB — C-REACTIVE PROTEIN: CRP: 3 mg/L (ref 0–10)

## 2023-08-09 LAB — VITAMIN B12: Vitamin B-12: 616 pg/mL (ref 232–1245)

## 2023-08-15 ENCOUNTER — Encounter: Payer: Self-pay | Admitting: Family Medicine

## 2023-08-15 ENCOUNTER — Ambulatory Visit: Payer: Medicare Other | Admitting: Family Medicine

## 2023-08-15 VITALS — BP 126/68 | HR 74 | Temp 98.8°F | Ht 68.0 in | Wt 173.0 lb

## 2023-08-15 DIAGNOSIS — R739 Hyperglycemia, unspecified: Secondary | ICD-10-CM | POA: Diagnosis not present

## 2023-08-15 DIAGNOSIS — D45 Polycythemia vera: Secondary | ICD-10-CM | POA: Diagnosis not present

## 2023-08-15 DIAGNOSIS — E785 Hyperlipidemia, unspecified: Secondary | ICD-10-CM | POA: Diagnosis not present

## 2023-08-15 DIAGNOSIS — M353 Polymyalgia rheumatica: Secondary | ICD-10-CM

## 2023-08-15 DIAGNOSIS — I10 Essential (primary) hypertension: Secondary | ICD-10-CM | POA: Diagnosis not present

## 2023-08-15 MED ORDER — AMLODIPINE BESYLATE 2.5 MG PO TABS: 2.5000 mg | ORAL_TABLET | Freq: Every day | ORAL | 1 refills | Status: DC

## 2023-08-15 MED ORDER — VALSARTAN 80 MG PO TABS: 80.0000 mg | ORAL_TABLET | Freq: Every day | ORAL | 1 refills | Status: DC

## 2023-08-15 MED ORDER — PREDNISONE 5 MG PO TABS: 5.0000 mg | ORAL_TABLET | Freq: Every day | ORAL | 1 refills | Status: DC

## 2023-08-15 MED ORDER — LEVOTHYROXINE SODIUM 100 MCG PO TABS: ORAL_TABLET | ORAL | 1 refills | Status: DC

## 2023-08-15 MED ORDER — ROPINIROLE HCL 0.5 MG PO TABS: ORAL_TABLET | ORAL | 1 refills | Status: DC

## 2023-08-15 NOTE — Progress Notes (Signed)
 Subjective:    Patient ID: Thomas [name], male    DOB: 05-18-1934, 89 y.o.   MRN: [id number]  HPI Follow up blood work results Questions regarding prednisone    Patient relates a lot of aching and stiffness in his back and in his legs hurts to get up when he first starts moving it feels very stiff he has polymyalgia rheumatica and recently we have been tapering the prednisone  but now he has a flareup that he thinks occurred when he is on 4 mg he is now on 3 mg and almost on the 2 mg level  Discussed the use of AI scribe software for clinical note transcription with the patient, who gave verbal consent to proceed.  History of Present Illness   Thomas [name] is an 89 year old male with polymyalgia rheumatica who presents with persistent leg soreness and stiffness.  He experiences persistent soreness in his legs, severe enough to impede walking. The soreness is constant and has worsened with the reduction of his prednisone  dosage. These symptoms have persisted for at least a month.  He is currently on prednisone , having recently completed a regimen of three milligrams per day, and is now transitioning to a higher dose due to worsening symptoms. He also takes Claritin occasionally and uses Flonase  daily for sinus drainage, which causes frequent coughing. He continues to use his lung inhalers regularly.  His breathing has improved since a previous infection cleared up, but he experiences significant sinus drainage. He reports a lack of energy, particularly during physical activities such as feeding chickens, which he attributes to muscle fatigue rather than breathing difficulties.  He takes pantoprazole  at night to protect against ulcers due to prednisone  use. He also takes blood pressure medication and adheres to all prescribed medications.  In terms of social history, he drinks three regular Dr. Lenton Rail daily and drinks bottled water , especially when working outside in warmer weather. He  manages his outdoor activities by working for 45 minutes and resting for 15 minutes to cope with his symptoms.  No breathing difficulties currently, attributing his lack of energy to muscle fatigue rather than respiratory issues. Significant sinus drainage leads to frequent coughing.      Review of Systems     Objective:   Physical Exam  General-in no acute distress Eyes-no discharge Lungs-respiratory rate normal, CTA CV-no murmurs,RRR Extremities skin warm dry no edema Neuro grossly normal Behavior normal, alert       Assessment & Plan:  Assessment and Plan    Polymyalgia Rheumatica Recurrent symptoms with impaired mobility due to reduced prednisone . Increasing dosage expected to alleviate symptoms. - Increase prednisone  to 10 mg daily for one week, then reduce to 5 mg daily. - Schedule follow-up in 4-6 weeks to assess response. - Report if no improvement in 10-14 days.  Prednisone -induced Hyperglycemia Slightly elevated glucose likely due to prednisone  and regular soda consumption. Reducing sugar intake advised. - Advise switching to diet soda. - Monitor blood glucose and dietary habits.  Gastroesophageal Reflux Disease (GERD) GERD managed with pantoprazole , important due to prednisone  use. - Continue pantoprazole  as prescribed.  Sinusitis Sinus infection resolved, but drainage causes cough. Managed with Claritin and Flonase . - Continue Claritin and Flonase  as needed.      1. Polymyalgia rheumatica (HCC) (Primary) We will go ahead and bump up the dose to 10 mg daily for 7 days and then 5 mg maintain this for 1 month reassess in 4 to 6 weeks at that point in time  possibly dropping to 4 mg monitor closely minimize carbohydrates in diet  2. Essential hypertension Blood pressure good control continue current measures  3. Hyperlipidemia, unspecified hyperlipidemia type Cholesterol previous labs look good continue current measures  4. Hyperglycemia Minimize  carbohydrates in diet  5. Polycythemia vera (HCC) Hemoglobin reasonable at 16.4 hematology states as long as it stays below 17.5 we are doing okay   If patient does not get adequate response regarding prednisone  in the next few days to let us  know

## 2023-08-26 ENCOUNTER — Telehealth: Payer: Self-pay | Admitting: Family Medicine

## 2023-08-26 MED ORDER — FLUTICASONE PROPIONATE 50 MCG/ACT NA SUSP: 1.0000 | Freq: Two times a day (BID) | NASAL | 1 refills | Status: DC

## 2023-08-26 NOTE — Telephone Encounter (Signed)
 Refill on    fluticasone  (FLONASE ) 50 MCG/ACT nasal spray  [facility]

## 2023-09-12 ENCOUNTER — Ambulatory Visit: Admitting: Family Medicine

## 2023-09-12 VITALS — BP 132/70 | HR 57 | Temp 98.2°F | Ht 68.0 in | Wt 172.0 lb

## 2023-09-12 DIAGNOSIS — M25552 Pain in left hip: Secondary | ICD-10-CM | POA: Diagnosis not present

## 2023-09-12 DIAGNOSIS — E538 Deficiency of other specified B group vitamins: Secondary | ICD-10-CM

## 2023-09-12 DIAGNOSIS — G609 Hereditary and idiopathic neuropathy, unspecified: Secondary | ICD-10-CM | POA: Diagnosis not present

## 2023-09-12 DIAGNOSIS — M353 Polymyalgia rheumatica: Secondary | ICD-10-CM | POA: Diagnosis not present

## 2023-09-12 DIAGNOSIS — M5432 Sciatica, left side: Secondary | ICD-10-CM | POA: Diagnosis not present

## 2023-09-12 NOTE — Progress Notes (Signed)
   Subjective:    Patient ID: Thomas [name], male    DOB: [birth year], 89 y.o.   MRN: [id number]  HPI Discussed the use of AI scribe software for clinical note transcription with the patient, who gave verbal consent to proceed.  History of Present Illness   Thomas [name] is an 89 year old male with polymyalgia rheumatica who presents with left hip and leg pain.  He experiences pain in the left hip and numbness in both legs. The pain is localized to the left back hip region and worsens with physical activity. He describes the pain as being 'right in there' when asked about the specific location. Additionally, his lower back is sometimes sensitive, particularly when he coughs, causing discomfort in the mid-back area.  He is currently taking prednisone  5 mg daily around lunchtime for stiffness associated with polymyalgia rheumatica. He also takes thyroid  medication, cholesterol medication, and two different types of blood pressure medications: valsartan  in the morning and another unspecified medication at night.  His breathing has been stable recently. Despite the discomfort in his legs and hip, he remains active and continues to engage in daily activities, although rainy days tend to keep him indoors. No recent issues with breathing.        Review of Systems     Objective:   Physical Exam General-in no acute distress Eyes-no discharge Lungs-respiratory rate normal, CTA CV-no murmurs,RRR Extremities skin warm dry no edema Neuro grossly normal Behavior normal, alert   Negative straight leg raise decent range of motion of both hips     Assessment & Plan:  Assessment and Plan    Hip and Leg Pain Chronic left hip and leg pain, worsened by activity, potential need for orthopedic intervention. - Refer to [facility] for evaluation and management.  Polymyalgia Rheumatica Chronic polymyalgia rheumatica managed with prednisone . - Continue prednisone  5 mg daily for June and  July. - Taper to 4 mg daily in August. - Contact in early August to assess condition and adjust prescription to 4 mg tablets.  Hypertension Hypertension well-controlled with current regimen. - Continue current antihypertensive medications, valsartan  in the morning and another medication at night.     1. Idiopathic peripheral neuropathy (Primary) Neuropathy continue current measures To some degree have an increased symptoms that could be related to spinal stenosis Will be referring to the [facility] for further workup of his back and left hip  2. Polymyalgia rheumatica (HCC) Currently on 5 mg daily We will continue this for June and July Then we will start tapering down gradually as tolerated   3. B12 deficiency Continue his OTC B12  4. Left hip pain Although this could be his left hip I am suspecting that this is more likely impingement in the back with sciatica his increased numbness in his legs could well be spinal stenosis patient states he is able to walk a little distance and then the pain prevents him from keeping to go.  Needs further workup by orthopedic back specialist  5. Sciatica, left side Negative straight leg raise decent strength but subjective discomfort in the left leg with increased walking goes along with sciatica could have an element of spinal stenosis referral to [facility]

## 2023-09-13 NOTE — Addendum Note (Signed)
 Addended byHugo Maes on: 09/13/2023 08:06 AM   Modules accepted: Orders

## 2023-09-26 ENCOUNTER — Ambulatory Visit (INDEPENDENT_AMBULATORY_CARE_PROVIDER_SITE_OTHER): Payer: Medicare Other | Admitting: Gastroenterology

## 2023-09-26 ENCOUNTER — Encounter (INDEPENDENT_AMBULATORY_CARE_PROVIDER_SITE_OTHER): Payer: Self-pay | Admitting: Gastroenterology

## 2023-09-26 VITALS — BP 153/86 | HR 61 | Temp 97.8°F | Ht 68.0 in | Wt 172.4 lb

## 2023-09-26 DIAGNOSIS — K219 Gastro-esophageal reflux disease without esophagitis: Secondary | ICD-10-CM

## 2023-09-26 NOTE — Patient Instructions (Signed)
 We will continue with protonix  40mg  daily Be mindful of greasy, spicy, fried, citrus foods, and  caffeine, carbonated drinks, chocolate and alcohol as these can increase reflux symptoms Stay upright 2-3 hours after eating, prior to lying down and avoid eating late in the evenings. Let me know if you have new or worsening GI issues, otherwise we will plan to see you back in about 1 year  It was a pleasure to see you today! I want to create trusting relationships with patients and provide genuine, compassionate, and quality care. I truly value your feedback! please be on the lookout for a survey regarding your visit with me today. I appreciate your input about our visit and your time in completing this!    Tonie Vizcarrondo L. Cheyenne Schumm, MSN, APRN, AGNP-C Adult-Gerontology Nurse Practitioner [facility]

## 2023-09-26 NOTE — Progress Notes (Addendum)
 Referring Provider: Bennet Brasil, MD Primary Care Physician:  Bennet Brasil, MD Primary GI Physician: Dr. Sammi Crick   Chief Complaint  Patient presents with   Follow-up    Patient here today for a follow up on GERD. He denies any current gi issues. He is taking pantoprazole  40 mg once per day . He would like to have a 90 day script sent into his drug store.   HPI:   Thomas [name] is a 89 y.o. male with past medical history of arthritis, GERD, hiatal hernia, HTN, hypothyroidism, polycythemia, sleep apnea (non CPAP)   Patient presenting today for:  Follow up of GERD  Last seen June 2024, at that time doing well. Dysphagia resolved after EGD. Taking protonix  40mg  daily. 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 He has no GI complaints today. GERD well controlled on protonix  40mg  daily, rarely has breakthrough. No dysphagia or odynophagia. Takes mylanta on occasion, maybe once in the past few months if reflux symptoms occur. Denies changes in appetite, weight loss, rectal bleeding or melena. No nausea or vomiting.    Last Colonoscopy: 2017 Diverticulosis in the sigmoid colon.                           - External hemorrhoids.                          - No specimens collected. Last Endoscopy:07/2022 - Non-obstructing Schatzki ring. Dilated.                           - 2 cm hiatal hernia.                           - Normal stomach.                           - Normal examined duodenum.                           - No specimens collected.   Filed Weights   09/26/23 1042  Weight: 172 lb 6.4 oz (78.2 kg)     Past Medical History:  Diagnosis Date   Arthritis    my entire back is gone   Asthma    GERD (gastroesophageal reflux disease)    H/O hiatal hernia    Hernia    History of stress test 2011   Hypertension    Hypothyroidism    Kidney stones     Pollen allergies    Polycythemia    Polycythemia, secondary 09/11/2013   Renal calculus    some removed by Cystoscopy, one passed spontaneously   Sleep apnea    mild no cpap    Past Surgical History:  Procedure Laterality Date   APPENDECTOMY     50 years ago   BACK SURGERY     L-4 , L-5  patient states that it was years ago.   COLONOSCOPY     at age 89   COLONOSCOPY N/A 04/06/2016   Procedure: COLONOSCOPY;  Surgeon: Ruby Corporal, MD;  Location: [facility];  Service: Endoscopy;  Laterality: N/A;  7:30  ESOPHAGEAL DILATION N/A 07/17/2022   Procedure: ESOPHAGEAL DILATION;  Surgeon: Urban Garden, MD;  Location: [facility];  Service: Gastroenterology;  Laterality: N/A;   ESOPHAGOGASTRODUODENOSCOPY (EGD) WITH PROPOFOL  N/A 07/17/2022   Procedure: ESOPHAGOGASTRODUODENOSCOPY (EGD) WITH PROPOFOL ;  Surgeon: Urban Garden, MD;  Location: [facility];  Service: Gastroenterology;  Laterality: N/A;  1:30pm; ASA 3   EYE SURGERY     cataracts removed- bilateral- /w IOL   INGUINAL HERNIA REPAIR Left 05/14/2016   Procedure: OPEN REPAIR OF SYMPTOMATIC (PAINFUL) LEFT INGUINAL HERNIA WITH MESH;  Surgeon: Franki Isles, MD;  Location: [facility];  Service: General;  Laterality: Left;   SINOSCOPY     THYROIDECTOMY Right 09/17/2012   Procedure: RIGHT HEMI-THYROIDECTOMY;  Surgeon: Lawence Press, MD;  Location: [facility];  Service: ENT;  Laterality: Right;   UPPER GASTROINTESTINAL ENDOSCOPY     egd/ed   YAG LASER APPLICATION Left 02/07/2016   Procedure: YAG LASER APPLICATION;  Surgeon: Albert Huff, MD;  Location: [facility];  Service: Ophthalmology;  Laterality: Left;    Current Outpatient Medications  Medication Sig Dispense Refill   albuterol  (VENTOLIN  HFA) 108 (90 Base) MCG/ACT inhaler Inhale 2 puffs into the lungs every 4 (four) hours as needed. 1 each 1   amLODipine  (NORVASC ) 2.5 MG tablet Take 1 tablet (2.5 mg total) by mouth daily. 90 tablet 1   budesonide -formoterol  (SYMBICORT )  80-4.5 MCG/ACT inhaler Inhale 2 puffs into the lungs 2 (two) times daily. 10.2 g 5   fluticasone  (FLONASE ) 50 MCG/ACT nasal spray Place 1 spray into both nostrils 2 (two) times daily. 16 g 1   Hypromellose (ARTIFICIAL TEARS OP) Place 1 drop into both eyes daily as needed (dry eyes).     ibuprofen (ADVIL,MOTRIN) 200 MG tablet Take 400 mg by mouth 2 (two) times daily as needed for headache or moderate pain.     ketoconazole  (NIZORAL ) 2 % cream APPLY TO AFFECTED AREAS TWICE DAILY AS NEEDED FOR IRRITATION. 60 g 0   levothyroxine  (SYNTHROID ) 100 MCG tablet 1 daily except Sundays take 1/2 tablet 90 tablet 1   mometasone  (ELOCON ) 0.1 % cream APPLY TO AFFECTED AREAS TWICE DAILY AS NEEDED. 45 g 0   mupirocin  ointment (BACTROBAN ) 2 % Apply thin amount twice daily to the cut (Patient taking differently: Apply 1 Application topically daily. Apply thin amount twice daily to the cut as needed.) 22 g 0   pantoprazole  (PROTONIX ) 40 MG tablet TAKE ONE TABLET BY MOUTH ONCE DAILY. 60 tablet 2   pravastatin  (PRAVACHOL ) 40 MG tablet TAKE 1 TABLET BY MOUTH AT BEDTIME. 90 tablet 1   predniSONE  (DELTASONE ) 5 MG tablet Take 1 tablet (5 mg total) by mouth daily with breakfast. 90 tablet 1   rOPINIRole  (REQUIP ) 0.5 MG tablet 1 at 6 pm 90 tablet 1   triamcinolone  cream (KENALOG ) 0.1 % Apply 1 Application topically 2 (two) times daily. (Patient taking differently: Apply 1 Application topically 2 (two) times daily. As needed) 45 g 2   valsartan  (DIOVAN ) 80 MG tablet Take 1 tablet (80 mg total) by mouth daily. 90 tablet 1   No current facility-administered medications for this visit.    Allergies as of 09/26/2023 - Review Complete 09/26/2023  Allergen Reaction Noted   Penicillins Other (See Comments) 12/09/2009    Social History   Socioeconomic History   Marital status: Widowed    Spouse name: Not on file   Number of children: Not on file   Years of education: Not on file  Highest education level: Not on file   Occupational History   Not on file  Tobacco Use   Smoking status: Former    Types: Cigarettes    Passive exposure: Never   Smokeless tobacco: Former    Types: Chew   Tobacco comments:    Patient states that he has been chewing for 60 plus years  Vaping Use   Vaping status: Never Used  Substance and Sexual Activity   Alcohol use: No    Comment: Patient states that he drinks a bout a case of beer a year   Drug use: No   Sexual activity: Never  Other Topics Concern   Not on file  Social History Narrative   Not on file   Social Drivers of Health   Financial Resource Strain: Low Risk  (02/15/2023)   Overall Financial Resource Strain (CARDIA)    Difficulty of Paying Living Expenses: Not hard at all  Food Insecurity: No Food Insecurity (02/15/2023)   Hunger Vital Sign    Worried About Running Out of Food in the Last Year: Never true    Ran Out of Food in the Last Year: Never true  Transportation Needs: No Transportation Needs (02/15/2023)   PRAPARE - Administrator, Civil Service (Medical): No    Lack of Transportation (Non-Medical): No  Physical Activity: Insufficiently Active (02/15/2023)   Exercise Vital Sign    Days of Exercise per Week: 3 days    Minutes of Exercise per Session: 30 min  Stress: No Stress Concern Present (02/15/2023)   [employer] of Occupational Health - Occupational Stress Questionnaire    Feeling of Stress : Not at all  Social Connections: Socially Isolated (02/15/2023)   Social Connection and Isolation Panel    Frequency of Communication with Friends and Family: More than three times a week    Frequency of Social Gatherings with Friends and Family: More than three times a week    Attends Religious Services: Never    Database administrator or Organizations: No    Attends Banker Meetings: Never    Marital Status: Widowed    Review of systems General: negative for malaise, night sweats, fever, chills, weight loss Neck:  Negative for lumps, goiter, pain and significant neck swelling Resp: Negative for cough, wheezing, dyspnea at rest CV: Negative for chest pain, leg swelling, palpitations, orthopnea GI: denies melena, hematochezia, nausea, vomiting, diarrhea, constipation, dysphagia, odyonophagia, early satiety or unintentional weight loss.  The remainder of the review of systems is noncontributory.  Physical Exam: BP (!) 153/86 (BP Location: Left Arm, Patient Position: Sitting, Cuff Size: Normal)   Pulse 61   Temp 97.8 F (36.6 C) (Temporal)   Ht 5' 8 (1.727 m)   Wt 172 lb 6.4 oz (78.2 kg)   BMI 26.21 kg/m  General:   Alert and oriented. No distress noted. Pleasant and cooperative.  Head:  Normocephalic and atraumatic. Eyes:  Conjuctiva clear without scleral icterus. Mouth:  Oral mucosa pink and moist. Good dentition. No lesions. Heart: Normal rate and rhythm, s1 and s2 heart sounds present.  Lungs: Clear lung sounds in all lobes. Respirations equal and unlabored. Abdomen:  +BS, soft, non-tender and non-distended. No rebound or guarding. No HSM or masses noted. Neurologic:  Alert and  oriented x4 Psych:  Alert and cooperative. Normal mood and affect.  Invalid input(s): 6 MONTHS   ASSESSMENT: Thomas [name] is a 89 y.o. male presenting today for GERD follow up  GERD  well controlled on protonix  40mg  daily, has rare breakthrough maybe every few months. No dysphagia or odynophagia. He has no GI concerns today. Will continue with PPI daily, good reflux precautions to include being mindful of greasy, spicy, fried, citrus foods, caffeine, carbonated drinks, chocolate and alcohol as these can increase reflux symptoms, Stay upright 2-3 hours after eating, prior to lying down and avoid eating late in the evenings.   PLAN:  -continue protonix  40mg  daily -good reflux precautions  All questions were answered, patient verbalized understanding and is in agreement with plan as outlined above.   Follow  Up: 1 year   Odean Mcelwain L. Adrien Alberta, MSN, APRN, AGNP-C Adult-Gerontology Nurse Practitioner [facility]  I have reviewed the note and agree with the APP's assessment as described in this progress note  Samantha Cress, MD Gastroenterology and Hepatology [facility]

## 2023-10-01 DIAGNOSIS — M545 Low back pain, unspecified: Secondary | ICD-10-CM | POA: Diagnosis not present

## 2023-10-01 DIAGNOSIS — M791 Myalgia, unspecified site: Secondary | ICD-10-CM | POA: Diagnosis not present

## 2023-10-01 DIAGNOSIS — M25552 Pain in left hip: Secondary | ICD-10-CM | POA: Diagnosis not present

## 2023-10-01 DIAGNOSIS — G8929 Other chronic pain: Secondary | ICD-10-CM | POA: Diagnosis not present

## 2023-10-20 ENCOUNTER — Other Ambulatory Visit: Payer: Self-pay | Admitting: Nurse Practitioner

## 2023-10-28 ENCOUNTER — Other Ambulatory Visit: Payer: Self-pay | Admitting: Family Medicine

## 2023-10-30 ENCOUNTER — Other Ambulatory Visit: Payer: Self-pay | Admitting: Family Medicine

## 2023-10-30 ENCOUNTER — Encounter: Payer: Self-pay | Admitting: Family Medicine

## 2023-10-30 MED ORDER — PREDNISONE 1 MG PO TABS: ORAL_TABLET | ORAL | 4 refills | Status: DC

## 2023-10-30 NOTE — Progress Notes (Signed)
 Please mail to the patient thank you

## 2023-11-05 ENCOUNTER — Other Ambulatory Visit (INDEPENDENT_AMBULATORY_CARE_PROVIDER_SITE_OTHER): Payer: Self-pay | Admitting: Gastroenterology

## 2023-11-14 ENCOUNTER — Other Ambulatory Visit: Payer: Self-pay | Admitting: Family Medicine

## 2023-11-27 ENCOUNTER — Ambulatory Visit: Payer: Medicare Other | Admitting: Allergy & Immunology

## 2023-11-27 ENCOUNTER — Encounter: Payer: Self-pay | Admitting: Allergy & Immunology

## 2023-11-27 VITALS — BP 122/80 | HR 60 | Temp 98.1°F | Resp 12 | Wt 170.1 lb

## 2023-11-27 DIAGNOSIS — J3089 Other allergic rhinitis: Secondary | ICD-10-CM | POA: Diagnosis not present

## 2023-11-27 DIAGNOSIS — J302 Other seasonal allergic rhinitis: Secondary | ICD-10-CM

## 2023-11-27 DIAGNOSIS — J4540 Moderate persistent asthma, uncomplicated: Secondary | ICD-10-CM | POA: Diagnosis not present

## 2023-11-27 NOTE — Progress Notes (Unsigned)
 FOLLOW UP  Date of Service/Encounter:  11/27/23   Assessment:   Moderate persistent asthma, uncomplicated   Seasonal and perennial allergic rhinitis - on allergen immunotherapy for 10+ years (stopped 2021)  Plan/Recommendations:   1. Moderate persistent asthma, uncomplicated - Lung testing looks amazing today.  - We are not going to make any medication changes at this time. - Daily controller medication(s): Symbicort  80/4.44mcg one puff twice daily with spacer - Prior to physical activity: albuterol  2 puffs 10-15 minutes before physical activity. - Rescue medications: albuterol  4 puffs every 4-6 hours as needed - Changes during respiratory infections or worsening symptoms: Increase Symbicort  80/4.62mcg to 2 puffs twice daily for TWO WEEKS. - Asthma control goals:  * Full participation in all desired activities (may need albuterol  before activity) * Albuterol  use two time or less a week on average (not counting use with activity) * Cough interfering with sleep two time or less a month * Oral steroids no more than once a year * No hospitalizations  2. Seasonal and perennial allergic rhinitis  - Continue with the Flonase , but increase to TWICE DAILY to help control some of that nighttime mucous. - Continue with Clarinex once daily.   3. Return in about 6 months (around 05/29/2024). You can have the follow up appointment with Dr. Iva or a Nurse Practicioner (our Nurse Practitioners are excellent and always have Physician oversight!).    Subjective:   Thomas [name] is a 89 y.o. male presenting today for follow up of  Chief Complaint  Patient presents with  . Follow-up    Thomas [name] has a history of the following: Patient Active Problem List   Diagnosis Date Noted  . Polycythemia vera (HCC) 08/15/2023  . Dysphagia 06/19/2022  . Moderate persistent asthma 03/26/2022  . Acute cough 03/26/2022  . CAD (coronary artery disease), native coronary artery 02/01/2022   . Hyperlipemia 09/14/2015  . Polycythemia, secondary 09/11/2013  . Allergic rhinitis 09/11/2013  . Hypothyroidism 06/01/2013  . Thyroid  nodule 08/29/2012  . GERD (gastroesophageal reflux disease) 05/07/2011  . Essential hypertension 12/09/2009    History obtained from: chart review and patient.  Discussed the use of AI scribe software for clinical note transcription with the patient and/or guardian, who gave verbal consent to proceed.  Thomas [name] is a 89 y.o. male presenting for a follow up visit.  He was last seen in February 2025 by Dr. Tobie.  At that time, asthma was under good control with Symbicort  80 mcg 1 puff twice daily as well as albuterol  as needed.  For his rhinitis, he had allergic sinusitis.  He was continued on nasal saline rinses and Flonase  as well as Clarinex.  Since last visit, he has done very well.   His breathing has been stable with the use of Symbicort , one puff twice a day. He has not needed to use his emergency inhaler recently. [place]'s asthma has been well controlled. He has not required rescue medication, experienced nocturnal awakenings due to lower respiratory symptoms, nor have activities of daily living been limited. He has required no Emergency Department or Urgent Care visits for his asthma. He has required zero courses of systemic steroids for asthma exacerbations since the last visit. ACT score today is 25, indicating excellent asthma symptom control.    He experiences occasional nighttime coughing and sometimes wakes up with a sensation of a 'flea in my throat', which he attributes to possible post-nasal drainage. He uses Flonase  in the morning but not at night,  which may contribute to these symptoms.  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 He enjoys gardening and reports a successful vegetable garden this year despite some challenges  with rain affecting his corn.       Asthma/Respiratory Symptom History: ***  Allergic Rhinitis Symptom History: ***  Food Allergy Symptom History: ***  Skin Symptom History: ***  GERD Symptom History: ***  Infection Symptom History: ***  Otherwise, there have been no changes to his past medical history, surgical history, family history, or social history.    Review of systems otherwise negative other than that mentioned in the HPI.    Objective:   Blood pressure 122/80, pulse 60, temperature 98.1 F (36.7 C), resp. rate 12, weight 170 lb 2 oz (77.2 kg), SpO2 98%. Body mass index is 25.87 kg/m.    Physical Exam   Diagnostic studies:    Spirometry: results normal (FEV1: 1.81/75%, FVC: 2.54/77%, FEV1/FVC: 71%).    Spirometry consistent with normal pattern.   Allergy Studies: {Blank single:19197::none,deferred due to recent antihistamine use,deferred due to insurance stipulations that require a separate visit for testing,labs sent instead, }    {Blank single:19197::Allergy testing results were read and interpreted by myself, documented by clinical staff., }      Marty Shaggy, MD  [facility] 

## 2023-11-27 NOTE — Patient Instructions (Addendum)
 1. Moderate persistent asthma, uncomplicated - Lung testing looks amazing today.  - We are not going to make any medication changes at this time. - Daily controller medication(s): Symbicort 80/4.71mcg one puff twice daily with spacer - Prior to physical activity: albuterol 2 puffs 10-15 minutes before physical activity. - Rescue medications: albuterol 4 puffs every 4-6 hours as needed - Changes during respiratory infections or worsening symptoms: Increase Symbicort 80/4.10mcg to 2 puffs twice daily for TWO WEEKS. - Asthma control goals:  * Full participation in all desired activities (may need albuterol before activity) * Albuterol use two time or less a week on average (not counting use with activity) * Cough interfering with sleep two time or less a month * Oral steroids no more than once a year * No hospitalizations  2. Seasonal and perennial allergic rhinitis  - Continue with the Flonase, but increase to TWICE DAILY to help control some of that nighttime mucous. - Continue with Clarinex once daily.   3. Return in about 6 months (around 05/29/2024). You can have the follow up appointment with Dr. Iva or a Nurse Practicioner (our Nurse Practitioners are excellent and always have Physician oversight!).    Please inform us  of any Emergency Department visits, hospitalizations, or changes in symptoms. Call us  before going to the ED for breathing or allergy symptoms since we might be able to fit you in for a sick visit. Feel free to contact us  anytime with any questions, problems, or concerns.  It was a pleasure to see you again today!  Websites that have reliable patient information: 1. American Academy of Asthma, Allergy, and Immunology: www.aaaai.org 2. Food Allergy Research and Education (FARE): foodallergy.org 3. Mothers of Asthmatics: http://www.asthmacommunitynetwork.org 4. American College of Allergy, Asthma, and Immunology: www.acaai.org      "Like" us  on Facebook and  Instagram for our latest updates!      A healthy democracy works best when [employer] participate! Make sure you are registered to vote! If you have moved or changed any of your contact information, you will need to get this updated before voting! Scan the QR codes below to learn more!

## 2023-11-28 MED ORDER — ALBUTEROL SULFATE HFA 108 (90 BASE) MCG/ACT IN AERS: 2.0000 | INHALATION_SPRAY | RESPIRATORY_TRACT | 1 refills | Status: AC | PRN

## 2023-11-28 MED ORDER — BUDESONIDE-FORMOTEROL FUMARATE 80-4.5 MCG/ACT IN AERO: 2.0000 | INHALATION_SPRAY | Freq: Two times a day (BID) | RESPIRATORY_TRACT | 5 refills | Status: AC

## 2023-12-03 ENCOUNTER — Encounter: Payer: Self-pay | Admitting: Allergy & Immunology

## 2023-12-19 ENCOUNTER — Other Ambulatory Visit: Payer: Self-pay | Admitting: Nurse Practitioner

## 2023-12-30 ENCOUNTER — Telehealth: Payer: Self-pay | Admitting: Family Medicine

## 2023-12-30 MED ORDER — TRIAMCINOLONE ACETONIDE 0.1 % EX CREA: 1.0000 | TOPICAL_CREAM | Freq: Two times a day (BID) | CUTANEOUS | 0 refills | Status: AC

## 2023-12-30 NOTE — Telephone Encounter (Signed)
 Refill sent.

## 2023-12-30 NOTE — Telephone Encounter (Signed)
 Refill on   triamcinolone  cream (KENALOG ) 0.1 %   [facility]

## 2024-01-22 DIAGNOSIS — M25552 Pain in left hip: Secondary | ICD-10-CM | POA: Diagnosis not present

## 2024-01-22 DIAGNOSIS — M791 Myalgia, unspecified site: Secondary | ICD-10-CM | POA: Diagnosis not present

## 2024-02-03 DIAGNOSIS — L820 Inflamed seborrheic keratosis: Secondary | ICD-10-CM | POA: Diagnosis not present

## 2024-02-03 DIAGNOSIS — B078 Other viral warts: Secondary | ICD-10-CM | POA: Diagnosis not present

## 2024-02-08 ENCOUNTER — Other Ambulatory Visit: Payer: Self-pay | Admitting: Family Medicine

## 2024-02-13 ENCOUNTER — Ambulatory Visit: Admitting: Family Medicine

## 2024-02-13 ENCOUNTER — Encounter: Payer: Self-pay | Admitting: Family Medicine

## 2024-02-13 VITALS — BP 118/76 | HR 67 | Resp 16 | Ht 68.0 in | Wt 172.0 lb

## 2024-02-13 DIAGNOSIS — R195 Other fecal abnormalities: Secondary | ICD-10-CM | POA: Diagnosis not present

## 2024-02-13 DIAGNOSIS — R110 Nausea: Secondary | ICD-10-CM | POA: Diagnosis not present

## 2024-02-13 DIAGNOSIS — G459 Transient cerebral ischemic attack, unspecified: Secondary | ICD-10-CM

## 2024-02-13 DIAGNOSIS — Z23 Encounter for immunization: Secondary | ICD-10-CM

## 2024-02-13 DIAGNOSIS — I10 Essential (primary) hypertension: Secondary | ICD-10-CM | POA: Diagnosis not present

## 2024-02-13 MED ORDER — ONDANSETRON HCL 4 MG PO TABS: 4.0000 mg | ORAL_TABLET | Freq: Three times a day (TID) | ORAL | 2 refills | Status: AC | PRN

## 2024-02-13 MED ORDER — AMLODIPINE BESYLATE 2.5 MG PO TABS: 2.5000 mg | ORAL_TABLET | Freq: Every day | ORAL | 1 refills | Status: AC

## 2024-02-13 MED ORDER — PRAVASTATIN SODIUM 40 MG PO TABS
40.0000 mg | ORAL_TABLET | Freq: Every day | ORAL | 1 refills | Status: AC
Start: 2024-02-13 — End: ?

## 2024-02-13 MED ORDER — VALSARTAN 80 MG PO TABS: 80.0000 mg | ORAL_TABLET | Freq: Every day | ORAL | 1 refills | Status: AC

## 2024-02-13 MED ORDER — LEVOTHYROXINE SODIUM 100 MCG PO TABS: ORAL_TABLET | ORAL | 1 refills | Status: AC

## 2024-02-13 MED ORDER — ROPINIROLE HCL 0.5 MG PO TABS: ORAL_TABLET | ORAL | 1 refills | Status: AC

## 2024-02-13 MED ORDER — ASPIRIN 81 MG PO TBEC: 81.0000 mg | DELAYED_RELEASE_TABLET | Freq: Every day | ORAL | Status: AC

## 2024-02-13 NOTE — Progress Notes (Signed)
 Complete physical exam  Patient: Thomas [name]   DOB: 05-24-1934   89 y.o. Male  MRN: [id number]  Subjective:    Chief Complaint  Patient presents with   Back Pain    Lower back and pain in both hips with leg numbness. Chronic issue but he wants to discuss options to help.    Nausea    On going issue. Feels nauseated right now but the issue comes and goes but its almost a daily issue     Most recent fall risk assessment:    02/13/2024    2:57 PM  Fall Risk   Falls in the past year? 0  Number falls in past yr: 0  Injury with Fall? 0  Risk for fall due to : No Fall Risks  Follow up Falls evaluation completed;Education provided;Falls prevention discussed     Most recent depression screenings:    02/13/2024    2:57 PM 09/12/2023    3:18 PM  PHQ 2/9 Scores  PHQ - 2 Score 0 0  PHQ- 9 Score 5 0        Patient Care Team: Alphonsa Glendia LABOR, MD as PCP - General (Family Medicine)   Outpatient Medications Prior to Visit  Medication Sig   albuterol  (VENTOLIN  HFA) 108 (90 Base) MCG/ACT inhaler Inhale 2 puffs into the lungs every 4 (four) hours as needed.   amLODipine  (NORVASC ) 2.5 MG tablet Take 1 tablet (2.5 mg total) by mouth daily.   budesonide -formoterol  (SYMBICORT ) 80-4.5 MCG/ACT inhaler Inhale 2 puffs into the lungs 2 (two) times daily.   fluticasone  (FLONASE ) 50 MCG/ACT nasal spray Place 1 spray into both nostrils 2 (two) times daily.   Hypromellose (ARTIFICIAL TEARS OP) Place 1 drop into both eyes daily as needed (dry eyes).   ibuprofen (ADVIL,MOTRIN) 200 MG tablet Take 400 mg by mouth 2 (two) times daily as needed for headache or moderate pain.   ketoconazole  (NIZORAL ) 2 % cream APPLY TO AFFECTED AREAS TWICE DAILY AS NEEDED FOR IRRITATION.   levothyroxine  (SYNTHROID ) 100 MCG tablet 1 daily except Sundays take 1/2 tablet   mometasone  (ELOCON ) 0.1 % cream APPLY TO AFFECTED AREAS TWICE DAILY AS NEEDED.   mupirocin  ointment (BACTROBAN ) 2 % Apply thin amount twice daily  to the cut (Patient taking differently: Apply 1 Application topically daily. Apply thin amount twice daily to the cut as needed.)   pantoprazole  (PROTONIX ) 40 MG tablet TAKE ONE TABLET BY MOUTH ONCE DAILY.   pravastatin  (PRAVACHOL ) 40 MG tablet Take 1 tablet (40 mg total) by mouth at bedtime.   predniSONE  (DELTASONE ) 1 MG tablet 4 p.o. daily   rOPINIRole  (REQUIP ) 0.5 MG tablet TAKE ONE TABLET BY MOUTH EVERY DAY AT 6PM.   triamcinolone  cream (KENALOG ) 0.1 % Apply 1 Application topically 2 (two) times daily. As needed   valsartan  (DIOVAN ) 80 MG tablet Take 1 tablet (80 mg total) by mouth daily.   No facility-administered medications prior to visit.   Discussed the use of AI scribe software for clinical note transcription with the patient, who gave verbal consent to proceed.  History of Present Illness   Thomas [name] is an 89 year old male who presents with lower back pain, hip pain, and gastrointestinal issues.  He experiences persistent lower back and hip pain, which sometimes causes him to lean over and prevents him from straightening up. The pain occasionally switches from the right to the left hip. He received two injections in his left hip three months apart, which  he reports helped a little but not a lot. He reports pain when walking short distances, such as from his bed to the kitchen, but no pain when lying down.  He experiences gastrointestinal issues, including nausea almost every day and loose stools three to four times a week. A previous treatment involving four pills a day dissolved in water  improved his stool consistency and reduced nausea for five to six months. No blood in stools, but he reports a decreased appetite, eating mostly in the late afternoon. His energy levels are low, affecting his ability to perform physical activities.  He experienced a transient episode of numbness on the right side of his body, including his leg, arm, neck, and face, lasting less than a minute. He  describes the sensation as 'like it went to sleep' without any associated weakness or confusion. He has not experienced any similar episodes since.  He is currently tapering off prednisone , with 16 days remaining. He confirms adherence to his thyroid , blood pressure, and cholesterol medications. He does not take aspirin . He mentions a family history of cardiovascular disease.      ROS Patient relates that he had numbness on the right side of his body lasting several minutes no weakness associated with it no drooping or difficulty speaking or swallowing but is distinct numbness for about 4 to 5 minutes this is concerning for the possibility of a TIA needs further workup       Objective:     BP 118/76   Pulse 67   Resp 16   Ht 5' 8 (1.727 m)   Wt 172 lb (78 kg)   SpO2 96%   BMI 26.15 kg/m    Physical Exam  General-in no acute distress Eyes-no discharge Lungs-respiratory rate normal, CTA CV-no murmurs,RRR Extremities skin warm dry no edema Neuro grossly normal Behavior normal, alert Abdomen is soft    Assessment & Plan:    Assessment and Plan    Chronic nausea and diarrhea Chronic nausea and diarrhea with frequent loose stools. Previous treatment provided temporary relief. No blood in stools. Reduced appetite and low energy levels noted. - Prescribe medication for nausea. - Continue acid blocker to prevent ulcers.  Possible transient ischemic attack (TIA) Episode of right-sided numbness suggests possible TIA. No prolonged symptoms or similar history. Blood pressure and cholesterol require monitoring. Family history of cardiovascular disease noted. Discussed stroke prevention and need for further evaluation. - Start 81 mg aspirin  daily to reduce stroke risk. - Order MRI of the brain to check for small strokes. - Order carotid ultrasound to assess for arterial blockages.  Chronic low back and bilateral hip pain Chronic pain with variable severity, worsens with movement,  improves with rest. Previous hip injections provided limited relief. MRI not pursued due to uncertain surgical benefit. Surgery not recommended due to potential complications and age. - Consider further hip injections if pain persists. - Reassess in three months if pain worsens to consider MRI.  Essential hypertension Blood pressure well-controlled with current medication regimen.  Encounter for immunization Due for flu vaccination. - Administer flu shot today.       Immunization History  Administered Date(s) Administered   Fluad Quad(high Dose 65+) 02/12/2020, 01/02/2021, 01/01/2022   Fluad Trivalent(High Dose 65+) 01/10/2023   INFLUENZA, HIGH DOSE SEASONAL PF 01/28/2012, 12/19/2018, 12/19/2018   Influenza,inj,Quad PF,6+ Mos 01/21/2014   Influenza-Unspecified 01/15/2015, 02/16/2015, 02/13/2016   Moderna Sars-Covid-2 Vaccination 05/21/2019, 06/19/2019   PNEUMOCOCCAL CONJUGATE-20 01/01/2022   Pneumococcal Conjugate-13 10/30/2013   Pneumococcal Polysaccharide-23  02/24/2007, 01/28/2012   Zoster, Live 02/16/2015    Health Maintenance  Topic Date Due   Zoster Vaccines- Shingrix (1 of 2) 10/24/1955   COVID-19 Vaccine (3 - Moderna risk series) 07/17/2019   Influenza Vaccine  11/15/2023   Medicare Annual Wellness (AWV)  02/15/2024   Pneumococcal Vaccine: 50+ Years  Completed   Meningococcal B Vaccine  Aged Out   DTaP/Tdap/Td  Discontinued    Discussed health benefits of physical activity, and encouraged him to engage in regular exercise appropriate for his age and condition.  Problem List Items Addressed This Visit       Cardiovascular and Mediastinum   Essential hypertension   Other Visit Diagnoses       Loose stools    -  Primary      No follow-ups on file.     Glendia Fielding, MD

## 2024-02-14 ENCOUNTER — Other Ambulatory Visit: Payer: Self-pay

## 2024-02-14 DIAGNOSIS — G459 Transient cerebral ischemic attack, unspecified: Secondary | ICD-10-CM

## 2024-02-16 ENCOUNTER — Ambulatory Visit (HOSPITAL_COMMUNITY)
Admission: RE | Admit: 2024-02-16 | Discharge: 2024-02-16 | Disposition: A | Discharge status: Home / Self Care | Source: Ambulatory Visit | Attending: Family Medicine | Admitting: Family Medicine

## 2024-02-16 DIAGNOSIS — G459 Transient cerebral ischemic attack, unspecified: Secondary | ICD-10-CM | POA: Insufficient documentation

## 2024-02-16 MED ORDER — GADOBUTROL 1 MMOL/ML IV SOLN
7.0000 mL | Freq: Once | INTRAVENOUS | Status: AC | PRN
  Administered 2024-02-16: 7 mL via INTRAVENOUS

## 2024-02-18 ENCOUNTER — Ambulatory Visit: Payer: Self-pay | Admitting: Family Medicine

## 2024-02-19 ENCOUNTER — Ambulatory Visit: Payer: Self-pay | Admitting: Family Medicine

## 2024-02-19 ENCOUNTER — Ambulatory Visit (HOSPITAL_COMMUNITY)
Admission: RE | Admit: 2024-02-19 | Discharge: 2024-02-19 | Disposition: A | Discharge status: Home / Self Care | Source: Ambulatory Visit | Attending: Family Medicine | Admitting: Family Medicine

## 2024-02-19 DIAGNOSIS — G459 Transient cerebral ischemic attack, unspecified: Secondary | ICD-10-CM | POA: Diagnosis present

## 2024-02-20 ENCOUNTER — Encounter (INDEPENDENT_AMBULATORY_CARE_PROVIDER_SITE_OTHER): Payer: Self-pay | Admitting: Gastroenterology

## 2024-02-20 ENCOUNTER — Other Ambulatory Visit: Payer: Self-pay

## 2024-02-20 DIAGNOSIS — K118 Other diseases of salivary glands: Secondary | ICD-10-CM

## 2024-02-21 ENCOUNTER — Ambulatory Visit: Payer: Medicare Other

## 2024-02-21 VITALS — Ht 68.0 in | Wt 172.0 lb

## 2024-02-21 DIAGNOSIS — Z0000 Encounter for general adult medical examination without abnormal findings: Secondary | ICD-10-CM

## 2024-02-21 NOTE — Progress Notes (Addendum)
 Subjective:   Thomas [name] is a 89 y.o. male who presents for a Medicare Annual Wellness Visit.  I connected with  Thomas [name] on 02/21/24 by a audio enabled telemedicine application and verified that I am speaking with the correct person using two identifiers.  Patient Location: Home  Provider Location: Office/Clinic  Persons Participating in Visit: Patient.  I discussed the limitations of evaluation and management by telemedicine. The patient expressed understanding and agreed to proceed.  Vital Signs: Because this visit was a virtual/telehealth visit, some criteria may be missing or patient reported. Any vitals not documented were not able to be obtained and vitals that have been documented are patient reported.  Allergies (verified) Penicillins   History: Past Medical History:  Diagnosis Date   Arthritis    my entire back is gone   Asthma    GERD (gastroesophageal reflux disease)    H/O hiatal hernia    Hernia    History of stress test 2011   Hypertension    Hypothyroidism    Kidney stones    Pollen allergies    Polycythemia    Polycythemia, secondary 09/11/2013   Renal calculus    some removed by Cystoscopy, one passed spontaneously   Sleep apnea    mild no cpap   Past Surgical History:  Procedure Laterality Date   APPENDECTOMY     50 years ago   BACK SURGERY     L-4 , L-5  patient states that it was years ago.   COLONOSCOPY     at age 72   COLONOSCOPY N/A 04/06/2016   Procedure: COLONOSCOPY;  Surgeon: Claudis RAYMOND Rivet, MD;  [facility];  Service: Endoscopy;  Laterality: N/A;  7:30   ESOPHAGEAL DILATION N/A 07/17/2022   Procedure: ESOPHAGEAL DILATION;  Surgeon: Eartha Angelia Sieving, MD;  Location: [facility];  Service: Gastroenterology;  Laterality: N/A;   ESOPHAGOGASTRODUODENOSCOPY (EGD) WITH PROPOFOL  N/A 07/17/2022   Procedure: ESOPHAGOGASTRODUODENOSCOPY (EGD) WITH PROPOFOL ;  Surgeon: Eartha Angelia Sieving, MD;  Location: [facility];  Service: Gastroenterology;  Laterality: N/A;  1:30pm; ASA 3   EYE SURGERY     cataracts removed- bilateral- /w IOL   INGUINAL HERNIA REPAIR Left 05/14/2016   Procedure: OPEN REPAIR OF SYMPTOMATIC (PAINFUL) LEFT INGUINAL HERNIA WITH MESH;  Surgeon: Selinda Artist Moats, MD;  Location: [facility];  Service: General;  Laterality: Left;   SINOSCOPY     THYROIDECTOMY Right 09/17/2012   Procedure: RIGHT HEMI-THYROIDECTOMY;  Surgeon: Ana LELON Moccasin, MD;  [facility];  Service: ENT;  Laterality: Right;   UPPER GASTROINTESTINAL ENDOSCOPY     egd/ed   YAG LASER APPLICATION Left 02/07/2016   Procedure: YAG LASER APPLICATION;  Surgeon: Oneil Platts, MD;  Location: [facility];  Service: Ophthalmology;  Laterality: Left;   Family History  Problem Relation Age of Onset   Heart disease Mother    Heart disease Father    Heart disease Brother    Healthy Son    Allergic rhinitis Neg Hx    Angioedema Neg Hx    Asthma Neg Hx    Atopy Neg Hx    Eczema Neg Hx    Immunodeficiency Neg Hx    Urticaria Neg Hx    Social History   Occupational History   Not on file  Tobacco Use   Smoking status: Former    Types: Cigarettes    Passive exposure: Never   Smokeless tobacco: Former    Types: Sports Administrator  Tobacco comments:    Patient states that he has been chewing for 60 plus years  Vaping Use   Vaping status: Never Used  Substance and Sexual Activity   Alcohol use: No    Comment: Patient states that he drinks a bout a case of beer a year   Drug use: No   Sexual activity: Never   Tobacco Counseling Counseling given: Not Answered Tobacco comments: Patient states that he has been chewing for 60 plus years  SDOH Screenings   Food Insecurity: No Food Insecurity (02/21/2024)  Housing: Unknown (02/21/2024)  Transportation Needs: No Transportation Needs (02/21/2024)  Utilities: Not At Risk (02/21/2024)  Alcohol Screen: Low Risk  (02/15/2023)  Depression (PHQ2-9): Low Risk  (02/21/2024)  Recent Concern:  Depression (PHQ2-9) - Medium Risk (02/13/2024)  Financial Resource Strain: Low Risk  (02/15/2023)  Physical Activity: Insufficiently Active (02/21/2024)  Social Connections: Socially Isolated (02/21/2024)  Stress: No Stress Concern Present (02/21/2024)  Tobacco Use: Medium Risk (02/21/2024)  Health Literacy: Adequate Health Literacy (02/21/2024)   Depression Screen    02/21/2024    8:53 AM 02/13/2024    2:57 PM 09/12/2023    3:18 PM 08/15/2023    1:50 PM 08/07/2023    3:37 PM 05/20/2023    3:39 PM 05/13/2023   11:01 AM  PHQ 2/9 Scores  PHQ - 2 Score 0 0 0 0 0 0 0  PHQ- 9 Score 4 5  0  3  5  0  0      Data saved with a previous flowsheet row definition     Goals Addressed             This Visit's Progress    Maintain health and independence   On track      Visit info / Clinical Intake: Medicare Wellness Visit Type:: Subsequent Annual Wellness Visit Medicare Wellness Visit Mode:: Telephone If telephone:: video declined If telephone or video:: vitals recorded from last visit Interpreter Needed?: No Pre-visit prep was completed: yes AWV questionnaire completed by patient prior to visit?: no Living arrangements:: (!) lives alone Patient's Overall Health Status Rating: good Typical amount of pain: none Does pain affect daily life?: no Are you currently prescribed opioids?: no  Dietary Habits and Nutritional Risks How many meals a day?: 3 Eats fruit and vegetables daily?: yes Most meals are obtained by: eating out Diabetic:: no  Functional Status Activities of Daily Living (to include ambulation/medication): Independent Ambulation: Independent Medication Administration: Independent Home Management: Independent Manage your own finances?: yes Primary transportation is: driving Concerns about vision?: no *vision screening is required for WTM* Concerns about hearing?: no  Fall Screening Falls in the past year?: 0 Number of falls in past year: 0 Was there an injury with  Fall?: 0 Fall Risk Category Calculator: 0 Patient Fall Risk Level: Low Fall Risk  Fall Risk Patient at Risk for Falls Due to: Impaired mobility Fall risk Follow up: Falls prevention discussed; Education provided; Falls evaluation completed  Home and Transportation Safety: All rugs have non-skid backing?: yes All stairs or steps have railings?: yes Grab bars in the bathtub or shower?: yes Have non-skid surface in bathtub or shower?: yes Good home lighting?: yes Regular seat belt use?: yes Hospital stays in the last year:: no  Cognitive Assessment Difficulty concentrating, remembering, or making decisions? : no Will 6CIT or Mini Cog be Completed: yes What year is it?: 0 points What month is it?: 0 points Give patient an address phrase to remember (5 components):  [address] [place] About what time is it?: 0 points Count backwards from 20 to 1: 0 points Say the months of the year in reverse: 0 points Repeat the address phrase from earlier: 0 points 6 CIT Score: 0 points  Advance Directives (For Healthcare) Does Patient Have a Medical Advance Directive?: No Would patient like information on creating a medical advance directive?: Yes (MAU/Ambulatory/Procedural Areas - Information given)  Reviewed/Updated  Reviewed/Updated: All        Objective:    Today's Vitals   02/21/24 0850  Weight: 172 lb (78 kg)  Height: 5' 8 (1.727 m)   Body mass index is 26.15 kg/m.  Current Medications (verified) Outpatient Encounter Medications as of 02/21/2024  Medication Sig   albuterol  (VENTOLIN  HFA) 108 (90 Base) MCG/ACT inhaler Inhale 2 puffs into the lungs every 4 (four) hours as needed.   amLODipine  (NORVASC ) 2.5 MG tablet Take 1 tablet (2.5 mg total) by mouth daily.   aspirin  EC 81 MG tablet Take 1 tablet (81 mg total) by mouth daily. Swallow whole.   budesonide -formoterol  (SYMBICORT ) 80-4.5 MCG/ACT inhaler Inhale 2 puffs into the lungs 2 (two) times daily.   fluticasone   (FLONASE ) 50 MCG/ACT nasal spray Place 1 spray into both nostrils 2 (two) times daily.   Hypromellose (ARTIFICIAL TEARS OP) Place 1 drop into both eyes daily as needed (dry eyes).   ibuprofen (ADVIL,MOTRIN) 200 MG tablet Take 400 mg by mouth 2 (two) times daily as needed for headache or moderate pain.   ketoconazole  (NIZORAL ) 2 % cream APPLY TO AFFECTED AREAS TWICE DAILY AS NEEDED FOR IRRITATION.   levothyroxine  (SYNTHROID ) 100 MCG tablet 1 daily except Sundays take 1/2 tablet   mometasone  (ELOCON ) 0.1 % cream APPLY TO AFFECTED AREAS TWICE DAILY AS NEEDED.   mupirocin  ointment (BACTROBAN ) 2 % Apply thin amount twice daily to the cut (Patient taking differently: Apply 1 Application topically daily. Apply thin amount twice daily to the cut as needed.)   ondansetron  (ZOFRAN ) 4 MG tablet Take 1 tablet (4 mg total) by mouth every 8 (eight) hours as needed for nausea or vomiting.   pantoprazole  (PROTONIX ) 40 MG tablet TAKE ONE TABLET BY MOUTH ONCE DAILY.   pravastatin  (PRAVACHOL ) 40 MG tablet Take 1 tablet (40 mg total) by mouth at bedtime.   predniSONE  (DELTASONE ) 1 MG tablet 4 p.o. daily   rOPINIRole  (REQUIP ) 0.5 MG tablet TAKE ONE TABLET BY MOUTH EVERY DAY AT 6PM.   triamcinolone  cream (KENALOG ) 0.1 % Apply 1 Application topically 2 (two) times daily. As needed   valsartan  (DIOVAN ) 80 MG tablet Take 1 tablet (80 mg total) by mouth daily.   No facility-administered encounter medications on file as of 02/21/2024.   Hearing/Vision screen Hearing Screening - Comments:: Hearing impairment; wears hearing aids  Vision Screening - Comments:: up to date with routine eye exams with Dr. Darroll  Immunizations and Health Maintenance Health Maintenance  Topic Date Due   Zoster Vaccines- Shingrix (1 of 2) 10/24/1955   COVID-19 Vaccine (3 - Moderna risk series) 07/17/2019   Medicare Annual Wellness (AWV)  02/20/2025   Pneumococcal Vaccine: 50+ Years  Completed   Influenza Vaccine  Completed   Meningococcal B  Vaccine  Aged Out   DTaP/Tdap/Td  Discontinued        Assessment/Plan:  This is a routine wellness examination for Rhyan.  Patient Care Team: Alphonsa Glendia LABOR, MD as PCP - General (Family Medicine) Johnson Reusing, PA-C as Physician Assistant (Orthopedic Surgery) [facility]  Iva Marty Saltness, MD as Consulting Physician (Allergy and Immunology)  I have personally reviewed and noted the following in the patient's chart:   Medical and social history Use of alcohol, tobacco or illicit drugs  Current medications and supplements including opioid prescriptions. Functional ability and status Nutritional status Physical activity Advanced directives List of other physicians Hospitalizations, surgeries, and ER visits in previous 12 months Vitals Screenings to include cognitive, depression, and falls Referrals and appointments  No orders of the defined types were placed in this encounter.  In addition, I have reviewed and discussed with patient certain preventive protocols, quality metrics, and best practice recommendations. A written personalized care plan for preventive services as well as general preventive health recommendations were provided to patient.   Lavelle Charmaine Browner, LPN   [id number]/05/[id number]   Return in 1 year (on 02/20/2025).  After Visit Summary: (Mail) Due to this being a telephonic visit, the after visit summary with patients personalized plan was offered to patient via mail   Nurse Notes: No concerns

## 2024-02-21 NOTE — Patient Instructions (Signed)
 Thomas [name],  Thank you for taking the time for your Medicare Wellness Visit. I appreciate your continued commitment to your health goals. Please review the care plan we discussed, and feel free to reach out if I can assist you further.  Please note that Annual Wellness Visits do not include a physical exam. Some assessments may be limited, especially if the visit was conducted virtually. If needed, we may recommend an in-person follow-up with your provider.  Ongoing Care Seeing your primary care provider every 3 to 6 months helps us  monitor your health and provide consistent, personalized care.   Referrals If a referral was made during today's visit and you haven't received any updates within two weeks, please contact the referred provider directly to check on the status.  Recommended Screenings:  Health Maintenance  Topic Date Due   Zoster (Shingles) Vaccine (1 of 2) 10/24/1955   COVID-19 Vaccine (3 - Moderna risk series) 07/17/2019   Medicare Annual Wellness Visit  02/20/2025   Pneumococcal Vaccine for age over 74  Completed   Flu Shot  Completed   Meningitis B Vaccine  Aged Out   DTaP/Tdap/Td vaccine  Discontinued       02/21/2024    8:51 AM  Advanced Directives  Does Patient Have a Medical Advance Directive? No  Would patient like information on creating a medical advance directive? Yes (MAU/Ambulatory/Procedural Areas - Information given)   Information on Advanced Care Planning can be found at [place]  Secretary of [facility] Advance Health Care Directives Advance Health Care Directives ([email or web address]   Vision: Annual vision screenings are recommended for early detection of glaucoma, cataracts, and diabetic retinopathy. These exams can also reveal signs of chronic conditions such as diabetes and high blood pressure.  Dental: Annual dental screenings help detect early signs of oral cancer, gum disease, and other conditions linked to overall health, including heart disease and  diabetes.  Please see the attached documents for additional preventive care recommendations.

## 2024-02-24 ENCOUNTER — Other Ambulatory Visit: Payer: Self-pay

## 2024-02-24 ENCOUNTER — Telehealth: Payer: Self-pay | Admitting: Family Medicine

## 2024-02-24 MED ORDER — FLUTICASONE PROPIONATE 50 MCG/ACT NA SUSP: 1.0000 | Freq: Two times a day (BID) | NASAL | 1 refills | Status: DC

## 2024-02-24 NOTE — Telephone Encounter (Signed)
 Refill has been sent to [facility].

## 2024-02-24 NOTE — Telephone Encounter (Signed)
 Refill fluticasone  (FLONASE ) 50 MCG/ACT nasal spray   [facility]

## 2024-03-10 LAB — HEPATIC FUNCTION PANEL
ALT: 27 IU/L (ref 0–44)
AST: 28 IU/L (ref 0–40)
Albumin: 4.3 g/dL (ref 3.7–4.7)
Alkaline Phosphatase: 65 IU/L (ref 48–129)
Bilirubin Total: 0.5 mg/dL (ref 0.0–1.2)
Bilirubin, Direct: 0.17 mg/dL (ref 0.00–0.40)
Total Protein: 6.5 g/dL (ref 6.0–8.5)

## 2024-03-10 LAB — CBC WITH DIFFERENTIAL/PLATELET
Basophils Absolute: 0 x10E3/uL (ref 0.0–0.2)
Basos: 1 %
EOS (ABSOLUTE): 0.3 x10E3/uL (ref 0.0–0.4)
Eos: 6 %
Hematocrit: 53.1 % — ABNORMAL HIGH (ref 37.5–51.0)
Hemoglobin: 17.3 g/dL (ref 13.0–17.7)
Immature Grans (Abs): 0 x10E3/uL (ref 0.0–0.1)
Immature Granulocytes: 0 %
Lymphocytes Absolute: 1.4 x10E3/uL (ref 0.7–3.1)
Lymphs: 26 %
MCH: 32.6 pg (ref 26.6–33.0)
MCHC: 32.6 g/dL (ref 31.5–35.7)
MCV: 100 fL — ABNORMAL HIGH (ref 79–97)
Monocytes Absolute: 0.5 x10E3/uL (ref 0.1–0.9)
Monocytes: 10 %
Neutrophils Absolute: 3.1 x10E3/uL (ref 1.4–7.0)
Neutrophils: 57 %
Platelets: 177 x10E3/uL (ref 150–450)
RBC: 5.31 x10E6/uL (ref 4.14–5.80)
RDW: 12.3 % (ref 11.6–15.4)
WBC: 5.4 x10E3/uL (ref 3.4–10.8)

## 2024-03-10 LAB — LIPASE: Lipase: 22 U/L (ref 13–78)

## 2024-03-17 ENCOUNTER — Encounter: Payer: Self-pay | Admitting: Family Medicine

## 2024-03-17 ENCOUNTER — Ambulatory Visit: Admitting: Family Medicine

## 2024-03-17 VITALS — BP 138/86 | HR 57 | Temp 97.7°F | Ht 68.0 in | Wt 175.0 lb

## 2024-03-17 DIAGNOSIS — K118 Other diseases of salivary glands: Secondary | ICD-10-CM

## 2024-03-17 NOTE — Progress Notes (Signed)
   Subjective:    Patient ID: Thomas [name], male    DOB: 1934/07/22, 89 y.o.   MRN: [id number]  HPI  4 week follow up - parotid gland follow up  Patient here for follow-up MRI showed parotid gland We referred him to ENT but have not heard anything from their office We will reconnect with their office Patient states energy level doing well Breathing well   Review of Systems     Objective:   Physical Exam General-in no acute distress Eyes-no discharge Lungs-respiratory rate normal, CTA CV-no murmurs,RRR Extremities skin warm dry no edema Neuro grossly normal Behavior normal, alert        Assessment & Plan:  Parotid gland nodule Will reconnect with ENT to have their office set him up an appointment  History of TIA-none since Follow-up in May lab work near that time

## 2024-03-24 ENCOUNTER — Encounter (INDEPENDENT_AMBULATORY_CARE_PROVIDER_SITE_OTHER): Payer: Self-pay | Admitting: Otolaryngology

## 2024-03-24 ENCOUNTER — Ambulatory Visit (INDEPENDENT_AMBULATORY_CARE_PROVIDER_SITE_OTHER): Admitting: Otolaryngology

## 2024-03-24 VITALS — BP 172/88 | HR 63 | Ht 68.0 in | Wt 170.0 lb

## 2024-03-24 DIAGNOSIS — F17200 Nicotine dependence, unspecified, uncomplicated: Secondary | ICD-10-CM

## 2024-03-24 DIAGNOSIS — K118 Other diseases of salivary glands: Secondary | ICD-10-CM

## 2024-03-24 NOTE — Progress Notes (Signed)
 Dear Dr. Alphonsa, Here is my assessment for our mutual patient, Thomas [name]. Thank you for allowing me the opportunity to care for your patient. Please do not hesitate to contact me should you have any other questions. Sincerely, Dr. Eldora Blanch  Otolaryngology Clinic Note Referring provider: Dr. Alphonsa HPI:  Thomas [name] is a 89 y.o. male kindly referred by Dr. Alphonsa for evaluation of parotid nodule.   Initial visit (03/2024) Patient reports: incidentally noted parotid lesion on the right -- noticed while doing workup for a TIA. No facial weakness or numbness, no skin cancers. No biopsies of this prior. He has not felt anything Patient otherwise denies: - dysphagia, odynophagia, unintentional weight loss - changes in voice, shortness of breath, hemoptysis - ear pain, neck masses  No skin cancers or lesions.   H&N Surgery: Thyroid  Lobectomy (2014), Nasal septal surgery (many years ago) Personal or FHx of bleeding dz or anesthesia difficulty: no  AP/AC: ASA 81  Tobacco: dip (still)  PMHx: HTN, CAD, Asthma, GERD, Secondary Polycythemia, TIA  Independent Review of Additional Tests or Records:  Thomas [name] (03/17/2024): parotid gland f/u, will ref to ENT CBC 03/09/2024: WBC 4.5, Plt 177 BMP 08/08/2023: BUN/Cr 8/0.96 MRI Brain 02/16/2024: Noted 3.6x1.4 cm cystic right parotid mass, cannot visualize neck nodes comprehensively; Appears bigger compared to 2017 CT which was also reviewed PMH/Meds/All/SocHx/FamHx/ROS:   Past Medical History:  Diagnosis Date   Arthritis    my entire back is gone   Asthma    GERD (gastroesophageal reflux disease)    H/O hiatal hernia    Hernia    History of stress test 2011   Hypertension    Hypothyroidism    Kidney stones    Pollen allergies    Polycythemia    Polycythemia, secondary 09/11/2013   Renal calculus    some removed by Cystoscopy, one passed spontaneously   Sleep apnea    mild no cpap     Past Surgical History:  Procedure  Laterality Date   APPENDECTOMY     50 years ago   BACK SURGERY     L-4 , L-5  patient states that it was years ago.   COLONOSCOPY     at age 48   COLONOSCOPY N/A 04/06/2016   Procedure: COLONOSCOPY;  Surgeon: Claudis RAYMOND Rivet, MD;  Location: [facility];  Service: Endoscopy;  Laterality: N/A;  7:30   ESOPHAGEAL DILATION N/A 07/17/2022   Procedure: ESOPHAGEAL DILATION;  Surgeon: Eartha Angelia Sieving, MD;  Location: [facility];  Service: Gastroenterology;  Laterality: N/A;   ESOPHAGOGASTRODUODENOSCOPY (EGD) WITH PROPOFOL  N/A 07/17/2022   Procedure: ESOPHAGOGASTRODUODENOSCOPY (EGD) WITH PROPOFOL ;  Surgeon: Eartha Angelia Sieving, MD;  Location: [facility];  Service: Gastroenterology;  Laterality: N/A;  1:30pm; ASA 3   EYE SURGERY     cataracts removed- bilateral- /w IOL   INGUINAL HERNIA REPAIR Left 05/14/2016   Procedure: OPEN REPAIR OF SYMPTOMATIC (PAINFUL) LEFT INGUINAL HERNIA WITH MESH;  Surgeon: Selinda Artist Moats, MD;  Location: [facility];  Service: General;  Laterality: Left;   SINOSCOPY     THYROIDECTOMY Right 09/17/2012   Procedure: RIGHT HEMI-THYROIDECTOMY;  Surgeon: Ana LELON Moccasin, MD;  Location: [facility] OR;  Service: ENT;  Laterality: Right;   UPPER GASTROINTESTINAL ENDOSCOPY     egd/ed   YAG LASER APPLICATION Left 02/07/2016   Procedure: YAG LASER APPLICATION;  Surgeon: Oneil Platts, MD;  Location: [facility];  Service: Ophthalmology;  Laterality: Left;    Family History  Problem Relation Age  of Onset   Heart disease Mother    Heart disease Father    Heart disease Brother    Healthy Son    Allergic rhinitis Neg Hx    Angioedema Neg Hx    Asthma Neg Hx    Atopy Neg Hx    Eczema Neg Hx    Immunodeficiency Neg Hx    Urticaria Neg Hx      Social Connections: Socially Isolated (02/21/2024)   Social Connection and Isolation Panel    Frequency of Communication with Friends and Family: More than three times a week    Frequency of Social Gatherings with Friends and Family: More than  three times a week    Attends Religious Services: Never    Database Administrator or Organizations: No    Attends Banker Meetings: Never    Marital Status: Widowed      Current Outpatient Medications:    albuterol  (VENTOLIN  HFA) 108 (90 Base) MCG/ACT inhaler, Inhale 2 puffs into the lungs every 4 (four) hours as needed., Disp: 1 each, Rfl: 1   amLODipine  (NORVASC ) 2.5 MG tablet, Take 1 tablet (2.5 mg total) by mouth daily., Disp: 90 tablet, Rfl: 1   aspirin  EC 81 MG tablet, Take 1 tablet (81 mg total) by mouth daily. Swallow whole., Disp: , Rfl:    budesonide -formoterol  (SYMBICORT ) 80-4.5 MCG/ACT inhaler, Inhale 2 puffs into the lungs 2 (two) times daily., Disp: 10.2 g, Rfl: 5   fluticasone  (FLONASE ) 50 MCG/ACT nasal spray, Place 1 spray into both nostrils 2 (two) times daily., Disp: 16 g, Rfl: 1   Hypromellose (ARTIFICIAL TEARS OP), Place 1 drop into both eyes daily as needed (dry eyes)., Disp: , Rfl:    ibuprofen (ADVIL,MOTRIN) 200 MG tablet, Take 400 mg by mouth 2 (two) times daily as needed for headache or moderate pain., Disp: , Rfl:    ketoconazole  (NIZORAL ) 2 % cream, APPLY TO AFFECTED AREAS TWICE DAILY AS NEEDED FOR IRRITATION., Disp: 60 g, Rfl: 0   levothyroxine  (SYNTHROID ) 100 MCG tablet, 1 daily except Sundays take 1/2 tablet, Disp: 90 tablet, Rfl: 1   mometasone  (ELOCON ) 0.1 % cream, APPLY TO AFFECTED AREAS TWICE DAILY AS NEEDED., Disp: 45 g, Rfl: 0   mupirocin  ointment (BACTROBAN ) 2 %, Apply thin amount twice daily to the cut (Patient taking differently: Apply 1 Application topically daily. Apply thin amount twice daily to the cut as needed.), Disp: 22 g, Rfl: 0   ondansetron  (ZOFRAN ) 4 MG tablet, Take 1 tablet (4 mg total) by mouth every 8 (eight) hours as needed for nausea or vomiting., Disp: 15 tablet, Rfl: 2   pantoprazole  (PROTONIX ) 40 MG tablet, TAKE ONE TABLET BY MOUTH ONCE DAILY., Disp: 60 tablet, Rfl: 2   pravastatin  (PRAVACHOL ) 40 MG tablet, Take 1 tablet  (40 mg total) by mouth at bedtime., Disp: 90 tablet, Rfl: 1   rOPINIRole  (REQUIP ) 0.5 MG tablet, TAKE ONE TABLET BY MOUTH EVERY DAY AT 6PM., Disp: 90 tablet, Rfl: 1   triamcinolone  cream (KENALOG ) 0.1 %, Apply 1 Application topically 2 (two) times daily. As needed, Disp: 80 g, Rfl: 0   valsartan  (DIOVAN ) 80 MG tablet, Take 1 tablet (80 mg total) by mouth daily., Disp: 90 tablet, Rfl: 1   Physical Exam:   BP (!) 172/88 (BP Location: Right Arm, Patient Position: Sitting, Cuff Size: Large)   Pulse 63   Ht 5' 8 (1.727 m)   Wt 170 lb (77.1 kg)   SpO2 96%  BMI 25.85 kg/m   Salient findings:  CN II-XII intact Bilateral EAC clear and TM intact with well pneumatized middle ear spaces Anterior rhinoscopy: Septum intact; bilateral inferior turbinates without significant hypertrophy No lesions of oral cavity/oropharynx No obviously palpable neck masses/lymphadenopathy/thyromegaly; prior neck scar well healed No respiratory distress or stridor Unable to feel parotid mass; some actinic changes, no obvious skin lesions concerning for carcinoma  Seprately Identifiable Procedures:  Prior to initiating any procedures, risks/benefits/alternatives were explained to the patient and verbal consent obtained. None  Impression & Plans:  Thomas [name] is a 89 y.o. male with   1. Parotid mass   2. Tobacco use disorder    We discussed benign and malignant etiologies for this. No bx, slowly growing. Likely benign given tobacco use. Discussed options and will get FNA first  Given the patient's tobacco use, I also discussed cessation and options for cessation, including counseling. Counseled patient on the dangers of tobacco use, advised patient to stop smoking, and reviewed strategies to maximize success. Patient is not ready to quit, and declined further treatment   - f/u 6 weeks by phone  See below regarding exact medications prescribed this encounter including dosages and route: No orders of the  defined types were placed in this encounter.     Thank you for allowing me the opportunity to care for your patient. Please do not hesitate to contact me should you have any other questions.  Sincerely, Eldora Blanch, MD Otolaryngologist (ENT), [facility] Phone: [phone] Fax: [phone]  03/24/2024, 11:51 AM   MDM:  I have personally spent 45 minutes involved in face-to-face and non-face-to-face activities for this patient on the day of the visit.  Professional time spent excludes any procedures performed but includes the following activities, in addition to those noted in the documentation: preparing to see the patient (review of outside documentation and results), performing a medically appropriate examination, counseling, documenting in the electronic health record, independently interpreting results (MRI, CT)

## 2024-03-24 NOTE — Progress Notes (Unsigned)
 Jennefer Ester PARAS, MD  Baldwin Rosella L PROCEDURE / BIOPSY REVIEW Date: 03/24/24  Requested Biopsy site: Right parotid mass (cystic) Reason for request: cystic mass Imaging review: MRI brain 02/16/24 - series 11, image 5  Decision: Approved Imaging modality to perform: Ultrasound Schedule with: No sedation / Local anesthetic Schedule for: Any VIR  Additional comments: none  Please contact me with questions, concerns, or if issue pertaining to this request arise.  Ester PARAS Jennefer, MD [facility]       Previous Messages    ----- Message ----- From: Baldwin Rosella CROME Sent: 03/24/2024   1:57 PM EST To: Rosella CROME Baldwin; Taryn F Rigney, RT; Ir Proc* Subject: US  FNA BX THYROID  1ST LESION AFIRMA            Procedure :US  FNA BX THYROID  1ST LESION AFIRMA  Reason :parotid nodule - request FNA Dx: Parotid mass [K11.8 (ICD-10-CM)]    History :US  Carotid Duplex Bilateral,US  Carotid Duplex Bilateral  Provider: Tobie Eldora NOVAK, MD  Provider contact ; [phone]

## 2024-04-09 ENCOUNTER — Emergency Department (HOSPITAL_COMMUNITY)

## 2024-04-09 ENCOUNTER — Encounter (HOSPITAL_COMMUNITY): Payer: Self-pay | Admitting: *Deleted

## 2024-04-09 ENCOUNTER — Other Ambulatory Visit: Payer: Self-pay

## 2024-04-09 ENCOUNTER — Emergency Department (HOSPITAL_COMMUNITY)
Admission: EM | Admit: 2024-04-09 | Discharge: 2024-04-09 | Disposition: A | Discharge status: Home / Self Care | Attending: Emergency Medicine | Admitting: Emergency Medicine

## 2024-04-09 DIAGNOSIS — Z7951 Long term (current) use of inhaled steroids: Secondary | ICD-10-CM | POA: Diagnosis not present

## 2024-04-09 DIAGNOSIS — I10 Essential (primary) hypertension: Secondary | ICD-10-CM | POA: Diagnosis not present

## 2024-04-09 DIAGNOSIS — R059 Cough, unspecified: Secondary | ICD-10-CM | POA: Diagnosis present

## 2024-04-09 DIAGNOSIS — R509 Fever, unspecified: Secondary | ICD-10-CM

## 2024-04-09 DIAGNOSIS — Z79899 Other long term (current) drug therapy: Secondary | ICD-10-CM | POA: Diagnosis not present

## 2024-04-09 DIAGNOSIS — J9801 Acute bronchospasm: Secondary | ICD-10-CM | POA: Insufficient documentation

## 2024-04-09 DIAGNOSIS — J45909 Unspecified asthma, uncomplicated: Secondary | ICD-10-CM | POA: Insufficient documentation

## 2024-04-09 DIAGNOSIS — Z7982 Long term (current) use of aspirin: Secondary | ICD-10-CM | POA: Insufficient documentation

## 2024-04-09 DIAGNOSIS — J101 Influenza due to other identified influenza virus with other respiratory manifestations: Secondary | ICD-10-CM | POA: Insufficient documentation

## 2024-04-09 LAB — RESP PANEL BY RT-PCR (RSV, FLU A&B, COVID)  RVPGX2
Influenza A by PCR: POSITIVE — AB
Influenza B by PCR: NEGATIVE
Resp Syncytial Virus by PCR: NEGATIVE
SARS Coronavirus 2 by RT PCR: NEGATIVE

## 2024-04-09 MED ORDER — PREDNISONE 50 MG PO TABS
60.0000 mg | ORAL_TABLET | Freq: Once | ORAL | Status: AC
  Administered 2024-04-09: 60 mg via ORAL
  Filled 2024-04-09: qty 1

## 2024-04-09 MED ORDER — LOPERAMIDE HCL 2 MG PO CAPS
4.0000 mg | ORAL_CAPSULE | Freq: Once | ORAL | Status: AC
  Administered 2024-04-09: 4 mg via ORAL
  Filled 2024-04-09: qty 2

## 2024-04-09 MED ORDER — OSELTAMIVIR PHOSPHATE 75 MG PO CAPS: 75.0000 mg | ORAL_CAPSULE | Freq: Two times a day (BID) | ORAL | 0 refills | Status: AC

## 2024-04-09 MED ORDER — OSELTAMIVIR PHOSPHATE 75 MG PO CAPS
75.0000 mg | ORAL_CAPSULE | Freq: Once | ORAL | Status: AC
  Administered 2024-04-09: 75 mg via ORAL
  Filled 2024-04-09: qty 1

## 2024-04-09 MED ORDER — IPRATROPIUM-ALBUTEROL 0.5-2.5 (3) MG/3ML IN SOLN
3.0000 mL | Freq: Once | RESPIRATORY_TRACT | Status: AC
  Administered 2024-04-09: 3 mL via RESPIRATORY_TRACT
  Filled 2024-04-09: qty 3

## 2024-04-09 MED ORDER — PREDNISONE 50 MG PO TABS: 50.0000 mg | ORAL_TABLET | Freq: Every day | ORAL | 0 refills | Status: AC

## 2024-04-09 NOTE — ED Triage Notes (Signed)
 Pt with cough that started the other day, fevers at home-was 100.5 yesterday per pt. + diarrhea, took two tylenol  about 2 hours ago per pt. Fever noted in triage of 101.5

## 2024-04-09 NOTE — Discharge Instructions (Signed)
 Drink plenty of fluids.  Acetaminophen /or ibuprofen as needed for fever or aching.  Continue using your inhaler every 4 hours as needed for cough or difficulty breathing.  Return to the emergency department if symptoms are getting worse.

## 2024-04-09 NOTE — ED Provider Notes (Signed)
 " [place] EMERGENCY DEPARTMENT AT [facility] Provider Note   CSN: [id number] Arrival date & time: 04/09/24  1331     Patient presents with: Cough   Thomas [name] is a 89 y.o. male.   The history is provided by the patient.  Cough  He has history of hypertension, asthma, GERD, polycythemia comes in because of fever, chills, cough which started yesterday.  Cough is productive of white sputum.  Temperatures been as high as 103.  He does endorse some bodyaches and has had diarrhea as well.  He denies nausea or vomiting.  Denies any sick contacts.  He did have the influenza immunization in the season.  Of note, he has been using his albuterol  inhaler with slight improvement in cough and wheezing.    Prior to Admission medications  Medication Sig Start Date End Date Taking? Authorizing Provider  albuterol  (VENTOLIN  HFA) 108 (90 Base) MCG/ACT inhaler Inhale 2 puffs into the lungs every 4 (four) hours as needed. 11/28/23   Iva Marty Saltness, MD  amLODipine  (NORVASC ) 2.5 MG tablet Take 1 tablet (2.5 mg total) by mouth daily. 02/13/24   Alphonsa Glendia LABOR, MD  aspirin  EC 81 MG tablet Take 1 tablet (81 mg total) by mouth daily. Swallow whole. 02/13/24   Alphonsa Glendia LABOR, MD  budesonide -formoterol  (SYMBICORT ) 80-4.5 MCG/ACT inhaler Inhale 2 puffs into the lungs 2 (two) times daily. 11/28/23   Iva Marty Saltness, MD  fluticasone  (FLONASE ) 50 MCG/ACT nasal spray Place 1 spray into both nostrils 2 (two) times daily. 02/24/24   Alphonsa Glendia LABOR, MD  Hypromellose (ARTIFICIAL TEARS OP) Place 1 drop into both eyes daily as needed (dry eyes).    [provider]  ibuprofen (ADVIL,MOTRIN) 200 MG tablet Take 400 mg by mouth 2 (two) times daily as needed for headache or moderate pain.    [provider]  ketoconazole  (NIZORAL ) 2 % cream APPLY TO AFFECTED AREAS TWICE DAILY AS NEEDED FOR IRRITATION. 03/27/23   Alphonsa Glendia LABOR, MD  levothyroxine  (SYNTHROID ) 100 MCG tablet 1 daily  except Sundays take 1/2 tablet 02/13/24   Luking, Scott A, MD  mometasone  (ELOCON ) 0.1 % cream APPLY TO AFFECTED AREAS TWICE DAILY AS NEEDED. 03/27/23   Alphonsa Glendia LABOR, MD  mupirocin  ointment (BACTROBAN ) 2 % Apply thin amount twice daily to the cut Patient taking differently: Apply 1 Application topically daily. Apply thin amount twice daily to the cut as needed. 01/10/23   Alphonsa Glendia LABOR, MD  ondansetron  (ZOFRAN ) 4 MG tablet Take 1 tablet (4 mg total) by mouth every 8 (eight) hours as needed for nausea or vomiting. 02/13/24   Alphonsa Glendia LABOR, MD  pantoprazole  (PROTONIX ) 40 MG tablet TAKE ONE TABLET BY MOUTH ONCE DAILY. 11/05/23   Carlan, Chelsea L, NP  pravastatin  (PRAVACHOL ) 40 MG tablet Take 1 tablet (40 mg total) by mouth at bedtime. 02/13/24   Alphonsa Glendia LABOR, MD  rOPINIRole  (REQUIP ) 0.5 MG tablet TAKE ONE TABLET BY MOUTH EVERY DAY AT 6PM. 02/13/24   Alphonsa Glendia LABOR, MD  triamcinolone  cream (KENALOG ) 0.1 % Apply 1 Application topically 2 (two) times daily. As needed 12/30/23   Alphonsa Glendia LABOR, MD  valsartan  (DIOVAN ) 80 MG tablet Take 1 tablet (80 mg total) by mouth daily. 02/13/24   Alphonsa Glendia LABOR, MD    Allergies: Penicillins    Review of Systems  Respiratory:  Positive for cough.   All other systems reviewed and are negative.   Updated Vital Signs BP 98/60 (  BP Location: Right Arm)   Pulse 83   Temp (!) 101.7 F (38.7 C) (Oral)   Resp 16   Ht 5' 8 (1.727 m)   Wt 74.8 kg   SpO2 94%   BMI 25.09 kg/m   Physical Exam Vitals and nursing note reviewed.   89 year old male, resting comfortably and in no acute distress. Vital signs are significant for elevated temperature. Oxygen  saturation is 94%, which is normal. Head is normocephalic and atraumatic. PERRLA, EOMI. Neck is nontender and supple without adenopathy. Lungs have diffuse expiratory wheezes without rales or rhonchi. Chest is nontender. Heart has regular rate and rhythm without murmur. Abdomen is soft, flat,  nontender. Skin is warm and dry without rash. Neurologic: Mental status is normal, cranial nerves are intact, moves all extremities equally.  (all labs ordered are listed, but only abnormal results are displayed) Labs Reviewed  RESP PANEL BY RT-PCR (RSV, FLU A&B, COVID)  [id number] - Abnormal; Notable for the following components:      Result Value   Influenza A by PCR POSITIVE (*)    All other components within normal limits    EKG: None  Radiology: DG Chest 2 View Result Date: 04/09/2024 CLINICAL DATA:  Fever with cough and diarrhea. EXAM: CHEST - 2 VIEW COMPARISON:  March 26, 2022 FINDINGS: The heart size and mediastinal contours are within normal limits. The lungs are mildly hyperinflated. No acute infiltrate, pleural effusion or pneumothorax is identified. Multilevel degenerative changes are present throughout the thoracic spine. IMPRESSION: No active cardiopulmonary disease. Electronically Signed   By: Suzen Dials M.D.   On: 04/09/2024 14:13     Procedures   Medications Ordered in the ED  predniSONE  (DELTASONE ) tablet 60 mg (60 mg Oral Given 04/09/24 1606)  ipratropium-albuterol  (DUONEB) 0.5-2.5 (3) MG/3ML nebulizer solution 3 mL (3 mLs Nebulization Given 04/09/24 1608)  oseltamivir  (TAMIFLU ) capsule 75 mg (75 mg Oral Given 04/09/24 1606)  loperamide  (IMODIUM ) capsule 4 mg (4 mg Oral Given 04/09/24 1606)                                    Medical Decision Making Amount and/or Complexity of Data Reviewed Radiology: ordered.  Risk Prescription drug management.   Respiratory tract infection.  Differential diagnoses include, but is not limited to, pneumonia, viral respiratory infection such as influenza or COVID-19 or RSV, bronchitis.  I have reviewed his laboratory test, my interpretation is positive PCR for influenza A, which he what is causing his symptoms.  I have ordered a dose of prednisone  and oseltamavir and I have ordered nebulizer treatment albuterol  and  ipratropium.  He feels somewhat better following nebulizer treatment.  I am discharging him with prescriptions for prednisone  and nefopa.  He is to continue using his albuterol  nebulizer as needed.  Return precautions discussed.     Final diagnoses:  Influenza A  Fever in adult  Bronchospasm    ED Discharge Orders          Ordered    predniSONE  (DELTASONE ) 50 MG tablet  Daily        04/09/24 1651    oseltamivir  (TAMIFLU ) 75 MG capsule  Every 12 hours        04/09/24 1651               Raford Lenis, MD 04/09/24 1655  "

## 2024-04-15 ENCOUNTER — Ambulatory Visit (HOSPITAL_COMMUNITY)

## 2024-04-15 ENCOUNTER — Ambulatory Visit (HOSPITAL_COMMUNITY): Admission: RE | Admit: 2024-04-15 | Discharge: 2024-04-15 | Attending: Otolaryngology

## 2024-04-15 DIAGNOSIS — K116 Mucocele of salivary gland: Secondary | ICD-10-CM | POA: Insufficient documentation

## 2024-04-15 DIAGNOSIS — K118 Other diseases of salivary glands: Secondary | ICD-10-CM

## 2024-04-15 MED ORDER — LIDOCAINE HCL (PF) 1 % IJ SOLN
10.0000 mL | Freq: Once | INTRAMUSCULAR | Status: AC
  Administered 2024-04-15: 10 mL

## 2024-04-15 NOTE — Procedures (Signed)
" °  Procedure:  US  aspiration R parotid cyst 5ml clear blood tinged Preprocedure diagnosis: The encounter diagnosis was Parotid mass. Postprocedure diagnosis: same EBL:    minimal Complications:   none immediate  See full dictation in [employer].  CHARM Toribio Faes MD Main # [phone] Pager  [phone] Mobile [phone]    "

## 2024-04-20 LAB — CYTOLOGY - NON PAP

## 2024-04-28 ENCOUNTER — Other Ambulatory Visit: Payer: Self-pay | Admitting: Family Medicine

## 2024-04-30 ENCOUNTER — Other Ambulatory Visit (INDEPENDENT_AMBULATORY_CARE_PROVIDER_SITE_OTHER): Payer: Self-pay | Admitting: Gastroenterology

## 2024-05-02 ENCOUNTER — Emergency Department (HOSPITAL_COMMUNITY): Admission: EM | Admit: 2024-05-02 | Discharge: 2024-05-02 | Disposition: A | Discharge status: Home / Self Care

## 2024-05-02 DIAGNOSIS — Z7982 Long term (current) use of aspirin: Secondary | ICD-10-CM | POA: Diagnosis not present

## 2024-05-02 DIAGNOSIS — L03211 Cellulitis of face: Secondary | ICD-10-CM | POA: Diagnosis present

## 2024-05-02 MED ORDER — CEPHALEXIN 500 MG PO CAPS: 500.0000 mg | ORAL_CAPSULE | Freq: Four times a day (QID) | ORAL | 0 refills | Status: DC

## 2024-05-02 MED ORDER — CEPHALEXIN 500 MG PO CAPS
500.0000 mg | ORAL_CAPSULE | Freq: Once | ORAL | Status: AC
  Administered 2024-05-02: 500 mg via ORAL
  Filled 2024-05-02: qty 1

## 2024-05-02 NOTE — ED Provider Notes (Signed)
 " [place] EMERGENCY DEPARTMENT AT [facility] Provider Note   CSN: [id number] Arrival date & time: 05/02/24  9088     Patient presents with: Post-op Problem   Thomas [name] is a 89 y.o. male.   89 year old male presents for evaluation of redness on his face.  States he had a parotid biopsy on 12-31 and he noticed 2 days ago he developed some redness and swelling over the area.  States the swelling was worse yesterday but has gone down but is still sore.  States his face is still red.  He has a follow-up with the ENT on Tuesday for his biopsy results.  He denies any fevers, chills, or any other symptoms or concerns at this time.        Prior to Admission medications  Medication Sig Start Date End Date Taking? Authorizing Provider  cephALEXin  (KEFLEX ) 500 MG capsule Take 1 capsule (500 mg total) by mouth 4 (four) times daily for 7 days. 05/02/24 05/09/24 Yes Taeshawn Helfman L, DO  albuterol  (VENTOLIN  HFA) 108 (90 Base) MCG/ACT inhaler Inhale 2 puffs into the lungs every 4 (four) hours as needed. 11/28/23   Iva Marty Saltness, MD  amLODipine  (NORVASC ) 2.5 MG tablet Take 1 tablet (2.5 mg total) by mouth daily. 02/13/24   Alphonsa Glendia LABOR, MD  aspirin  EC 81 MG tablet Take 1 tablet (81 mg total) by mouth daily. Swallow whole. 02/13/24   Alphonsa Glendia LABOR, MD  budesonide -formoterol  (SYMBICORT ) 80-4.5 MCG/ACT inhaler Inhale 2 puffs into the lungs 2 (two) times daily. 11/28/23   Iva Marty Saltness, MD  fluticasone  (FLONASE ) 50 MCG/ACT nasal spray Place 1 spray into both nostrils 2 (two) times daily. 04/28/24   Alphonsa Glendia LABOR, MD  Hypromellose (ARTIFICIAL TEARS OP) Place 1 drop into both eyes daily as needed (dry eyes).    [provider]  ibuprofen (ADVIL,MOTRIN) 200 MG tablet Take 400 mg by mouth 2 (two) times daily as needed for headache or moderate pain.    [provider]  ketoconazole  (NIZORAL ) 2 % cream APPLY TO AFFECTED AREAS TWICE DAILY AS NEEDED FOR  IRRITATION. 03/27/23   Alphonsa Glendia LABOR, MD  levothyroxine  (SYNTHROID ) 100 MCG tablet 1 daily except Sundays take 1/2 tablet 02/13/24   Luking, Scott A, MD  mometasone  (ELOCON ) 0.1 % cream APPLY TO AFFECTED AREAS TWICE DAILY AS NEEDED. 03/27/23   Alphonsa Glendia LABOR, MD  mupirocin  ointment (BACTROBAN ) 2 % Apply thin amount twice daily to the cut Patient taking differently: Apply 1 Application topically daily. Apply thin amount twice daily to the cut as needed. 01/10/23   Alphonsa Glendia LABOR, MD  ondansetron  (ZOFRAN ) 4 MG tablet Take 1 tablet (4 mg total) by mouth every 8 (eight) hours as needed for nausea or vomiting. 02/13/24   Alphonsa Glendia LABOR, MD  oseltamivir  (TAMIFLU ) 75 MG capsule Take 1 capsule (75 mg total) by mouth every 12 (twelve) hours. 04/09/24   Raford Lenis, MD  pantoprazole  (PROTONIX ) 40 MG tablet TAKE ONE TABLET BY MOUTH ONCE DAILY. 04/30/24   Carlan, Chelsea L, NP  pravastatin  (PRAVACHOL ) 40 MG tablet Take 1 tablet (40 mg total) by mouth at bedtime. 02/13/24   Alphonsa Glendia LABOR, MD  predniSONE  (DELTASONE ) 50 MG tablet Take 1 tablet (50 mg total) by mouth daily. 04/09/24   Raford Lenis, MD  rOPINIRole  (REQUIP ) 0.5 MG tablet TAKE ONE TABLET BY MOUTH EVERY DAY AT 6PM. 02/13/24   Alphonsa Glendia LABOR, MD  triamcinolone  cream (KENALOG ) 0.1 % Apply  1 Application topically 2 (two) times daily. As needed 12/30/23   Alphonsa Glendia LABOR, MD  valsartan  (DIOVAN ) 80 MG tablet Take 1 tablet (80 mg total) by mouth daily. 02/13/24   Alphonsa Glendia LABOR, MD    Allergies: Penicillins    Review of Systems  Constitutional:  Negative for chills and fever.  HENT:  Negative for ear pain and sore throat.   Eyes:  Negative for pain and visual disturbance.  Respiratory:  Negative for cough and shortness of breath.   Cardiovascular:  Negative for chest pain and palpitations.  Gastrointestinal:  Negative for abdominal pain and vomiting.  Genitourinary:  Negative for dysuria and hematuria.  Musculoskeletal:  Negative for  arthralgias and back pain.  Skin:  Negative for color change and rash.  Neurological:  Negative for seizures and syncope.  All other systems reviewed and are negative.   Updated Vital Signs BP 131/79   Pulse 66   Temp 98.7 F (37.1 C)   Resp 18   SpO2 98%   Physical Exam Vitals and nursing note reviewed.  Constitutional:      General: He is not in acute distress.    Appearance: Normal appearance. He is well-developed. He is not ill-appearing.  HENT:     Head: Normocephalic and atraumatic.     Comments: Right face over the mastoid is mildly tender to palpation with some erythema of the overlying skin but no mass palpated, no abscess palpated, no swelling or lymphadenopathy    Nose: Nose normal.     Mouth/Throat:     Mouth: Mucous membranes are moist.     Pharynx: Oropharynx is clear. No oropharyngeal exudate or posterior oropharyngeal erythema.  Eyes:     Conjunctiva/sclera: Conjunctivae normal.  Cardiovascular:     Rate and Rhythm: Normal rate and regular rhythm.     Heart sounds: No murmur heard. Pulmonary:     Effort: Pulmonary effort is normal. No respiratory distress.     Breath sounds: Normal breath sounds.  Abdominal:     Palpations: Abdomen is soft.     Tenderness: There is no abdominal tenderness.  Musculoskeletal:        General: No swelling.     Cervical back: Neck supple.  Skin:    General: Skin is warm and dry.     Capillary Refill: Capillary refill takes less than 2 seconds.  Neurological:     General: No focal deficit present.     Mental Status: He is alert.  Psychiatric:        Mood and Affect: Mood normal.     (all labs ordered are listed, but only abnormal results are displayed) Labs Reviewed - No data to display  EKG: None  Radiology: No results found.   Procedures   Medications Ordered in the ED  cephALEXin  (KEFLEX ) capsule 500 mg (has no administration in time range)                                    Medical Decision  Making Patient here for possible mild cellulitis of his face and in the setting of recently having a parotid biopsy.  He otherwise appears well with no other concerning signs or symptoms stable vitals and no fever.  I did offer him labs and CT scan but he declined.  Will start him on Keflex .  He has a follow-up in 3 days with his ENT provider to go  over results.  Advised him to go to this appointment and discuss his ongoing symptoms.  Advised Tylenol  Motrin as needed for pain and to return for any new or worsening symptoms.  He feels comfortable being discharged home.  Problems Addressed: Cellulitis of face: acute illness or injury  Amount and/or Complexity of Data Reviewed External Data Reviewed: notes.    Details: Prior biopsy results were reviewed and patient had no malignant cells seen on biopsy from 12-21  Risk OTC drugs. Prescription drug management.     Final diagnoses:  Cellulitis of face    ED Discharge Orders          Ordered    cephALEXin  (KEFLEX ) 500 MG capsule  4 times daily        05/02/24 0937               Gennaro Duwaine CROME, DO 05/02/24 [phone]  "

## 2024-05-02 NOTE — ED Triage Notes (Signed)
 Pt states that on 12/31 he had a biopsy of R parotid gland and two days ago began having redness and swelling to the area. Pt denies any fevers or drainage.

## 2024-05-02 NOTE — Discharge Instructions (Addendum)
 Follow-up with your specialist as scheduled on Tuesday.  Take your Keflex  as prescribed.  Return to the ER for any new or worsening symptoms.

## 2024-05-02 NOTE — ED Notes (Signed)
 Pt has a bump on the post op site and claims it was swelling. Slight swelling noted but no warmth felt. Pt states he's been using ice to keep swelling down.

## 2024-05-05 ENCOUNTER — Ambulatory Visit (INDEPENDENT_AMBULATORY_CARE_PROVIDER_SITE_OTHER): Admitting: Otolaryngology

## 2024-05-05 DIAGNOSIS — L03211 Cellulitis of face: Secondary | ICD-10-CM

## 2024-05-05 DIAGNOSIS — K118 Other diseases of salivary glands: Secondary | ICD-10-CM

## 2024-05-05 DIAGNOSIS — F17200 Nicotine dependence, unspecified, uncomplicated: Secondary | ICD-10-CM

## 2024-05-05 DIAGNOSIS — F17290 Nicotine dependence, other tobacco product, uncomplicated: Secondary | ICD-10-CM | POA: Diagnosis not present

## 2024-05-05 MED ORDER — CEPHALEXIN 500 MG PO CAPS: 500.0000 mg | ORAL_CAPSULE | Freq: Four times a day (QID) | ORAL | 0 refills | Status: AC

## 2024-05-05 NOTE — Progress Notes (Unsigned)
 Dear Dr. Alphonsa, Here is my assessment for our mutual patient, Thomas [name]. Thank you for allowing me the opportunity to care for your patient. Please do not hesitate to contact me should you have any other questions. Sincerely, Dr. Eldora Blanch  Otolaryngology Clinic Note Referring provider: Dr. Alphonsa HPI:  Thomas [name] is a 89 y.o. male kindly referred by Dr. Alphonsa for evaluation of parotid nodule.   Initial visit (03/2024) Patient reports: incidentally noted parotid lesion on the right -- noticed while doing workup for a TIA. No facial weakness or numbness, no skin cancers. No biopsies of this prior. He has not felt anything Patient otherwise denies: - dysphagia, odynophagia, unintentional weight loss - changes in voice, shortness of breath, hemoptysis - ear pain, neck masses  No skin cancers or lesions.  --------------------------------------------------------- 05/05/2024 The patient gave consent to have this visit done by telemedicine / virtual visit, two identifiers were used to identify patient. This is also consent for access the chart and treat the patient via this visit. The patient is located in [place] .  I, the provider, am at the office.  We spent 9 minutes together for the visit.  Joined by telephone  Did have his biopsy. Approx 2 weeks later, however, devleoped facial cellulitis in biopsy area for which he was seen in ED. Redness and tenderness. He's on 4th day of Keflex  and feeling better.No fevers or chills.   H&N Surgery: Thyroid  Lobectomy (2014), Nasal septal surgery (many years ago) Personal or FHx of bleeding dz or anesthesia difficulty: no  AP/AC: ASA 81  Tobacco: dip (still)  PMHx: HTN, CAD, Asthma, GERD, Secondary Polycythemia, TIA  Independent Review of Additional Tests or Records:  Thomas [name] (03/17/2024): parotid gland f/u, will ref to ENT CBC 03/09/2024: WBC 4.5, Plt 177 BMP 08/08/2023: BUN/Cr 8/0.96 MRI Brain 02/16/2024: Noted 3.6x1.4 cm  cystic right parotid mass, cannot visualize neck nodes comprehensively; Appears bigger compared to 2017 CT which was also reviewed Cytology 04/15/2024: no malignant cells noted, tiny myxoid stroma and oncocytic cells noted ED notes 05/03/2023 reviewed Thomas [name] -- Dx facial cellulitis; Rx: keflex  PMH/Meds/All/SocHx/FamHx/ROS:   Past Medical History:  Diagnosis Date   Arthritis    my entire back is gone   Asthma    GERD (gastroesophageal reflux disease)    H/O hiatal hernia    Hernia    History of stress test 2011   Hypertension    Hypothyroidism    Kidney stones    Pollen allergies    Polycythemia    Polycythemia, secondary 09/11/2013   Renal calculus    some removed by Cystoscopy, one passed spontaneously   Sleep apnea    mild no cpap     Past Surgical History:  Procedure Laterality Date   APPENDECTOMY     50 years ago   BACK SURGERY     L-4 , L-5  patient states that it was years ago.   COLONOSCOPY     at age 34   COLONOSCOPY N/A 04/06/2016   Procedure: COLONOSCOPY;  Surgeon: Claudis RAYMOND Rivet, MD;  Location: [facility];  Service: Endoscopy;  Laterality: N/A;  7:30   ESOPHAGEAL DILATION N/A 07/17/2022   Procedure: ESOPHAGEAL DILATION;  Surgeon: Eartha Angelia Sieving, MD;  Location: [facility];  Service: Gastroenterology;  Laterality: N/A;   ESOPHAGOGASTRODUODENOSCOPY (EGD) WITH PROPOFOL  N/A 07/17/2022   Procedure: ESOPHAGOGASTRODUODENOSCOPY (EGD) WITH PROPOFOL ;  Surgeon: Eartha Angelia Sieving, MD;  Location: [facility];  Service: Gastroenterology;  Laterality: N/A;  1:30pm; ASA 3   EYE SURGERY     cataracts removed- bilateral- /w IOL   INGUINAL HERNIA REPAIR Left 05/14/2016   Procedure: OPEN REPAIR OF SYMPTOMATIC (PAINFUL) LEFT INGUINAL HERNIA WITH MESH;  Surgeon: Selinda Artist Moats, MD;  Location: [facility];  Service: General;  Laterality: Left;   SINOSCOPY     THYROIDECTOMY Right 09/17/2012   Procedure: RIGHT HEMI-THYROIDECTOMY;  Surgeon: Ana LELON Moccasin, MD;   Location: [facility] OR;  Service: ENT;  Laterality: Right;   UPPER GASTROINTESTINAL ENDOSCOPY     egd/ed   YAG LASER APPLICATION Left 02/07/2016   Procedure: YAG LASER APPLICATION;  Surgeon: Oneil Platts, MD;  Location: [facility];  Service: Ophthalmology;  Laterality: Left;    Family History  Problem Relation Age of Onset   Heart disease Mother    Heart disease Father    Heart disease Brother    Healthy Son    Allergic rhinitis Neg Hx    Angioedema Neg Hx    Asthma Neg Hx    Atopy Neg Hx    Eczema Neg Hx    Immunodeficiency Neg Hx    Urticaria Neg Hx      Social Connections: Socially Isolated (02/21/2024)   Social Connection and Isolation Panel    Frequency of Communication with Friends and Family: More than three times a week    Frequency of Social Gatherings with Friends and Family: More than three times a week    Attends Religious Services: Never    Database Administrator or Organizations: No    Attends Banker Meetings: Never    Marital Status: Widowed      Current Outpatient Medications:    cephALEXin  (KEFLEX ) 500 MG capsule, Take 1 capsule (500 mg total) by mouth 4 (four) times daily for 7 days., Disp: 28 capsule, Rfl: 0   albuterol  (VENTOLIN  HFA) 108 (90 Base) MCG/ACT inhaler, Inhale 2 puffs into the lungs every 4 (four) hours as needed., Disp: 1 each, Rfl: 1   amLODipine  (NORVASC ) 2.5 MG tablet, Take 1 tablet (2.5 mg total) by mouth daily., Disp: 90 tablet, Rfl: 1   aspirin  EC 81 MG tablet, Take 1 tablet (81 mg total) by mouth daily. Swallow whole., Disp: , Rfl:    budesonide -formoterol  (SYMBICORT ) 80-4.5 MCG/ACT inhaler, Inhale 2 puffs into the lungs 2 (two) times daily., Disp: 10.2 g, Rfl: 5   fluticasone  (FLONASE ) 50 MCG/ACT nasal spray, Place 1 spray into both nostrils 2 (two) times daily., Disp: 16 g, Rfl: 1   Hypromellose (ARTIFICIAL TEARS OP), Place 1 drop into both eyes daily as needed (dry eyes)., Disp: , Rfl:    ibuprofen (ADVIL,MOTRIN) 200 MG tablet, Take  400 mg by mouth 2 (two) times daily as needed for headache or moderate pain., Disp: , Rfl:    ketoconazole  (NIZORAL ) 2 % cream, APPLY TO AFFECTED AREAS TWICE DAILY AS NEEDED FOR IRRITATION., Disp: 60 g, Rfl: 0   levothyroxine  (SYNTHROID ) 100 MCG tablet, 1 daily except Sundays take 1/2 tablet, Disp: 90 tablet, Rfl: 1   mometasone  (ELOCON ) 0.1 % cream, APPLY TO AFFECTED AREAS TWICE DAILY AS NEEDED., Disp: 45 g, Rfl: 0   mupirocin  ointment (BACTROBAN ) 2 %, Apply thin amount twice daily to the cut (Patient taking differently: Apply 1 Application topically daily. Apply thin amount twice daily to the cut as needed.), Disp: 22 g, Rfl: 0   ondansetron  (ZOFRAN ) 4 MG tablet, Take 1 tablet (4 mg total) by mouth every 8 (eight) hours as needed  for nausea or vomiting., Disp: 15 tablet, Rfl: 2   oseltamivir  (TAMIFLU ) 75 MG capsule, Take 1 capsule (75 mg total) by mouth every 12 (twelve) hours., Disp: 10 capsule, Rfl: 0   pantoprazole  (PROTONIX ) 40 MG tablet, TAKE ONE TABLET BY MOUTH ONCE DAILY., Disp: 60 tablet, Rfl: 2   pravastatin  (PRAVACHOL ) 40 MG tablet, Take 1 tablet (40 mg total) by mouth at bedtime., Disp: 90 tablet, Rfl: 1   predniSONE  (DELTASONE ) 50 MG tablet, Take 1 tablet (50 mg total) by mouth daily., Disp: 5 tablet, Rfl: 0   rOPINIRole  (REQUIP ) 0.5 MG tablet, TAKE ONE TABLET BY MOUTH EVERY DAY AT 6PM., Disp: 90 tablet, Rfl: 1   triamcinolone  cream (KENALOG ) 0.1 %, Apply 1 Application topically 2 (two) times daily. As needed, Disp: 80 g, Rfl: 0   valsartan  (DIOVAN ) 80 MG tablet, Take 1 tablet (80 mg total) by mouth daily., Disp: 90 tablet, Rfl: 1   Physical Exam:   There were no vitals taken for this visit.  Salient findings:  Unable to perform given this was a phone visit  Seprately Identifiable Procedures:  Prior to initiating any procedures, risks/benefits/alternatives were explained to the patient and verbal consent obtained. None  Impression & Plans:  Glyn Gerads is a 89 y.o. male with    1. Parotid mass   2. Tobacco use disorder   3. Facial cellulitis    We discussed benign and malignant etiologies for this. Bx without malignant cells; discussed options including core bx v/s excision. He would like to observe currently as he is asymptomatic. Unclear what to make of facial cellulitis as no other antecedent event besides bx. He is improving and will do another week of Keflex  to ensure resolution Return precautions discussed F/u 2 weeks to ensure resolution   See below regarding exact medications prescribed this encounter including dosages and route: Meds ordered this encounter  Medications   cephALEXin  (KEFLEX ) 500 MG capsule    Sig: Take 1 capsule (500 mg total) by mouth 4 (four) times daily for 7 days.    Dispense:  28 capsule    Refill:  0      Thank you for allowing me the opportunity to care for your patient. Please do not hesitate to contact me should you have any other questions.  Sincerely, Eldora Blanch, MD Otolaryngologist (ENT), [facility] Phone: [phone] Fax: [phone]  05/08/2024, 3:56 PM   MDM:  [id number] - chronic problem, new problem, review of note, labs, and managing prescription medications

## 2024-05-07 ENCOUNTER — Telehealth (INDEPENDENT_AMBULATORY_CARE_PROVIDER_SITE_OTHER): Payer: Self-pay | Admitting: Otolaryngology

## 2024-05-07 NOTE — Telephone Encounter (Signed)
 Thomas [name] called from [facility] wanting to let Thomas [name] know that the medication he prescribed for  Thomas [name] is a duplicate prescription by another doctor that he saw at the Emergency Department at [facility] on 05/02/2024 ,he is about to finish his 7 day supply.  [name] is wanting to know is Thomas [name] wanting to prescribe him this medication again. Contact [name] [phone] [facility])

## 2024-05-07 NOTE — Telephone Encounter (Signed)
 Informed pharmacy of providers recommendation. Pharmacist verbalized understanding.

## 2024-05-08 ENCOUNTER — Encounter (INDEPENDENT_AMBULATORY_CARE_PROVIDER_SITE_OTHER): Payer: Self-pay | Admitting: Otolaryngology

## 2024-05-26 ENCOUNTER — Ambulatory Visit (INDEPENDENT_AMBULATORY_CARE_PROVIDER_SITE_OTHER): Admitting: Otolaryngology

## 2024-06-19 ENCOUNTER — Ambulatory Visit: Admitting: Allergy & Immunology

## 2024-09-09 ENCOUNTER — Ambulatory Visit: Admitting: Family Medicine

## 2025-03-05 ENCOUNTER — Ambulatory Visit
